# Patient Record
Sex: Female | Born: 1970 | Race: White | Hispanic: No | Smoking: Never smoker
Health system: Southern US, Community
[De-identification: ages and names within clinical notes are randomized; demographics above are authoritative.]

## PROBLEM LIST (undated history)

## (undated) ENCOUNTER — Ambulatory Visit

## (undated) DIAGNOSIS — F419 Anxiety disorder, unspecified: Secondary | ICD-10-CM

## (undated) DIAGNOSIS — T7840XA Allergy, unspecified, initial encounter: Secondary | ICD-10-CM

## (undated) DIAGNOSIS — R569 Unspecified convulsions: Secondary | ICD-10-CM

## (undated) DIAGNOSIS — Z9109 Other allergy status, other than to drugs and biological substances: Secondary | ICD-10-CM

## (undated) DIAGNOSIS — F329 Major depressive disorder, single episode, unspecified: Secondary | ICD-10-CM

## (undated) DIAGNOSIS — M1990 Unspecified osteoarthritis, unspecified site: Secondary | ICD-10-CM

## (undated) DIAGNOSIS — I341 Nonrheumatic mitral (valve) prolapse: Secondary | ICD-10-CM

## (undated) DIAGNOSIS — O24419 Gestational diabetes mellitus in pregnancy, unspecified control: Secondary | ICD-10-CM

## (undated) DIAGNOSIS — R011 Cardiac murmur, unspecified: Secondary | ICD-10-CM

## (undated) DIAGNOSIS — F32A Depression, unspecified: Secondary | ICD-10-CM

## (undated) DIAGNOSIS — Z8619 Personal history of other infectious and parasitic diseases: Secondary | ICD-10-CM

## (undated) DIAGNOSIS — G43909 Migraine, unspecified, not intractable, without status migrainosus: Secondary | ICD-10-CM

## (undated) HISTORY — PX: DILATION AND CURETTAGE OF UTERUS: SHX78

## (undated) HISTORY — DX: Unspecified convulsions: R56.9

## (undated) HISTORY — DX: Allergy, unspecified, initial encounter: T78.40XA

## (undated) HISTORY — DX: Other allergy status, other than to drugs and biological substances: Z91.09

## (undated) HISTORY — DX: Personal history of other infectious and parasitic diseases: Z86.19

## (undated) HISTORY — DX: Nonrheumatic mitral (valve) prolapse: I34.1

## (undated) HISTORY — DX: Gestational diabetes mellitus in pregnancy, unspecified control: O24.419

## (undated) HISTORY — DX: Cardiac murmur, unspecified: R01.1

## (undated) HISTORY — DX: Unspecified osteoarthritis, unspecified site: M19.90

## (undated) HISTORY — DX: Migraine, unspecified, not intractable, without status migrainosus: G43.909

## (undated) HISTORY — DX: Depression, unspecified: F32.A

## (undated) HISTORY — DX: Major depressive disorder, single episode, unspecified: F32.9

---

## 2001-05-10 HISTORY — PX: WISDOM TOOTH EXTRACTION: SHX21

## 2005-05-10 HISTORY — PX: OTHER SURGICAL HISTORY: SHX169

## 2006-05-10 HISTORY — PX: LEEP: SHX91

## 2011-02-08 HISTORY — PX: ABDOMINAL HYSTERECTOMY: SHX81

## 2012-02-18 ENCOUNTER — Ambulatory Visit: Payer: Self-pay | Admitting: Internal Medicine

## 2012-02-22 ENCOUNTER — Ambulatory Visit: Payer: Self-pay | Admitting: Internal Medicine

## 2012-05-26 ENCOUNTER — Other Ambulatory Visit (HOSPITAL_COMMUNITY)
Admission: RE | Admit: 2012-05-26 | Discharge: 2012-05-26 | Disposition: A | Discharge status: Home / Self Care | Payer: 59 | Source: Ambulatory Visit | Attending: Internal Medicine | Admitting: Internal Medicine

## 2012-05-26 ENCOUNTER — Ambulatory Visit (INDEPENDENT_AMBULATORY_CARE_PROVIDER_SITE_OTHER): Payer: 59 | Admitting: Internal Medicine

## 2012-05-26 ENCOUNTER — Encounter: Payer: Self-pay | Admitting: Internal Medicine

## 2012-05-26 VITALS — BP 110/70 | HR 87 | Temp 98.7°F | Ht 67.0 in | Wt 134.0 lb

## 2012-05-26 DIAGNOSIS — Z01419 Encounter for gynecological examination (general) (routine) without abnormal findings: Secondary | ICD-10-CM | POA: Insufficient documentation

## 2012-05-26 DIAGNOSIS — G40909 Epilepsy, unspecified, not intractable, without status epilepticus: Secondary | ICD-10-CM

## 2012-05-26 DIAGNOSIS — Z1151 Encounter for screening for human papillomavirus (HPV): Secondary | ICD-10-CM | POA: Insufficient documentation

## 2012-05-26 DIAGNOSIS — F329 Major depressive disorder, single episode, unspecified: Secondary | ICD-10-CM

## 2012-05-26 DIAGNOSIS — G43909 Migraine, unspecified, not intractable, without status migrainosus: Secondary | ICD-10-CM

## 2012-05-26 DIAGNOSIS — I341 Nonrheumatic mitral (valve) prolapse: Secondary | ICD-10-CM

## 2012-05-26 DIAGNOSIS — Z139 Encounter for screening, unspecified: Secondary | ICD-10-CM

## 2012-05-26 DIAGNOSIS — I059 Rheumatic mitral valve disease, unspecified: Secondary | ICD-10-CM

## 2012-05-28 ENCOUNTER — Encounter: Payer: Self-pay | Admitting: Internal Medicine

## 2012-05-28 DIAGNOSIS — I341 Nonrheumatic mitral (valve) prolapse: Secondary | ICD-10-CM | POA: Insufficient documentation

## 2012-05-28 DIAGNOSIS — F329 Major depressive disorder, single episode, unspecified: Secondary | ICD-10-CM | POA: Insufficient documentation

## 2012-05-28 DIAGNOSIS — G40909 Epilepsy, unspecified, not intractable, without status epilepticus: Secondary | ICD-10-CM | POA: Insufficient documentation

## 2012-05-28 DIAGNOSIS — G43909 Migraine, unspecified, not intractable, without status migrainosus: Secondary | ICD-10-CM | POA: Insufficient documentation
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
## 2012-05-28 NOTE — Assessment & Plan Note (Signed)
Better since her hysterectomy.  Not a frequent occurrence.  See above.  Hold on any additional treatment.  Follow.   

## 2012-05-28 NOTE — Assessment & Plan Note (Signed)
Doing well on Dilantin.  Sees neurology in [facility].  Has EEG every other year.  Stable.  Follow. They are following labs related to the Dilantin. Obtain records.

## 2012-05-28 NOTE — Assessment & Plan Note (Signed)
Murmur audible on exam.  Has been several years since last echo.  Apparently had mitral regurgitation with the MVP.  Obtain echo to further evaluate the mitral valve.  Currently asymptomatic.    

## 2012-05-30 ENCOUNTER — Encounter: Payer: Self-pay | Admitting: *Deleted

## 2012-06-27 ENCOUNTER — Other Ambulatory Visit: Payer: 59

## 2013-02-21 ENCOUNTER — Ambulatory Visit: Payer: Self-pay | Admitting: Internal Medicine

## 2013-03-09 ENCOUNTER — Encounter: Payer: Self-pay | Admitting: Internal Medicine

## 2013-06-01 ENCOUNTER — Other Ambulatory Visit (HOSPITAL_COMMUNITY)
Admission: RE | Admit: 2013-06-01 | Discharge: 2013-06-01 | Disposition: A | Discharge status: Home / Self Care | Payer: 59 | Source: Ambulatory Visit | Attending: Internal Medicine | Admitting: Internal Medicine

## 2013-06-01 ENCOUNTER — Ambulatory Visit (INDEPENDENT_AMBULATORY_CARE_PROVIDER_SITE_OTHER): Payer: 59 | Admitting: Internal Medicine

## 2013-06-01 ENCOUNTER — Encounter: Payer: Self-pay | Admitting: Internal Medicine

## 2013-06-01 VITALS — BP 110/70 | HR 95 | Temp 98.6°F | Ht 67.0 in | Wt 142.2 lb

## 2013-06-01 DIAGNOSIS — Z124 Encounter for screening for malignant neoplasm of cervix: Secondary | ICD-10-CM

## 2013-06-01 DIAGNOSIS — R5383 Other fatigue: Secondary | ICD-10-CM

## 2013-06-01 DIAGNOSIS — B9789 Other viral agents as the cause of diseases classified elsewhere: Secondary | ICD-10-CM

## 2013-06-01 DIAGNOSIS — F329 Major depressive disorder, single episode, unspecified: Secondary | ICD-10-CM

## 2013-06-01 DIAGNOSIS — F32A Depression, unspecified: Secondary | ICD-10-CM

## 2013-06-01 DIAGNOSIS — I059 Rheumatic mitral valve disease, unspecified: Secondary | ICD-10-CM

## 2013-06-01 DIAGNOSIS — F3289 Other specified depressive episodes: Secondary | ICD-10-CM

## 2013-06-01 DIAGNOSIS — Z1151 Encounter for screening for human papillomavirus (HPV): Secondary | ICD-10-CM | POA: Insufficient documentation

## 2013-06-01 DIAGNOSIS — B349 Viral infection, unspecified: Secondary | ICD-10-CM

## 2013-06-01 DIAGNOSIS — G40909 Epilepsy, unspecified, not intractable, without status epilepticus: Secondary | ICD-10-CM

## 2013-06-01 DIAGNOSIS — I341 Nonrheumatic mitral (valve) prolapse: Secondary | ICD-10-CM

## 2013-06-01 DIAGNOSIS — G43909 Migraine, unspecified, not intractable, without status migrainosus: Secondary | ICD-10-CM

## 2013-06-01 DIAGNOSIS — Z13220 Encounter for screening for lipoid disorders: Secondary | ICD-10-CM

## 2013-06-01 DIAGNOSIS — Z01419 Encounter for gynecological examination (general) (routine) without abnormal findings: Secondary | ICD-10-CM | POA: Insufficient documentation

## 2013-06-01 DIAGNOSIS — M5430 Sciatica, unspecified side: Secondary | ICD-10-CM

## 2013-06-01 DIAGNOSIS — R5381 Other malaise: Secondary | ICD-10-CM

## 2013-06-01 NOTE — Progress Notes (Signed)
Pre-visit discussion using our clinic review tool. No additional management support is needed unless otherwise documented below in the visit note.  
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
## 2013-06-01 NOTE — Assessment & Plan Note (Signed)
Murmur audible on exam.  Has been several years since last echo.  Apparently had mitral regurgitation with the MVP.  Obtain echo to further evaluate the mitral valve.  Currently asymptomatic.

## 2013-06-04 ENCOUNTER — Encounter: Payer: Self-pay | Admitting: Internal Medicine

## 2013-06-04 DIAGNOSIS — M5430 Sciatica, unspecified side: Secondary | ICD-10-CM | POA: Insufficient documentation

## 2013-06-04 DIAGNOSIS — B349 Viral infection, unspecified: Secondary | ICD-10-CM | POA: Insufficient documentation

## 2013-06-04 NOTE — Assessment & Plan Note (Signed)
Symptoms just started today.  Probably a viral syndrome.  Treat symptoms.  Tylenol.   Saline.  Follow.  Notify me if symptoms worsen.

## 2013-06-04 NOTE — Assessment & Plan Note (Signed)
Doing well on Dilantin.  Sees neurology in [facility].  Has EEG every other year.  Stable.  Follow. They are following labs related to the Dilantin.

## 2013-06-04 NOTE — Assessment & Plan Note (Signed)
Better since her hysterectomy.  Not a frequent occurrence.  See above.  Hold on any additional treatment.  Follow.
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
## 2013-06-04 NOTE — Assessment & Plan Note (Signed)
Is planning to get back in the gym.  Exercising.  Wants to see if improves with this.  Will let me know if persistent problems.

## 2013-06-06 ENCOUNTER — Encounter: Payer: Self-pay | Admitting: Internal Medicine

## 2013-06-13 ENCOUNTER — Other Ambulatory Visit (INDEPENDENT_AMBULATORY_CARE_PROVIDER_SITE_OTHER): Payer: 59

## 2013-06-13 DIAGNOSIS — R5383 Other fatigue: Secondary | ICD-10-CM

## 2013-06-13 DIAGNOSIS — R5381 Other malaise: Secondary | ICD-10-CM

## 2013-06-13 DIAGNOSIS — Z13220 Encounter for screening for lipoid disorders: Secondary | ICD-10-CM

## 2013-06-13 LAB — LIPID PANEL
CHOL/HDL RATIO: 3
Cholesterol: 189 mg/dL (ref 0–200)
HDL: 70.7 mg/dL (ref >39.00)
LDL CALC: 100 mg/dL — AB (ref 0–99)
Triglycerides: 94 mg/dL (ref 0.0–149.0)
VLDL: 18.8 mg/dL (ref 0.0–40.0)

## 2013-06-13 LAB — COMPREHENSIVE METABOLIC PANEL
ALBUMIN: 4 g/dL (ref 3.5–5.2)
ALK PHOS: 82 U/L (ref 39–117)
ALT: 16 U/L (ref 0–35)
AST: 17 U/L (ref 0–37)
BUN: 13 mg/dL (ref 6–23)
CO2: 26 mEq/L (ref 19–32)
Calcium: 9 mg/dL (ref 8.4–10.5)
Chloride: 106 mEq/L (ref 96–112)
Creatinine, Ser: 0.6 mg/dL (ref 0.4–1.2)
GFR: 109.04 mL/min (ref >60.00)
Glucose, Bld: 82 mg/dL (ref 70–99)
Potassium: 4.5 mEq/L (ref 3.5–5.1)
Sodium: 140 mEq/L (ref 135–145)
Total Bilirubin: 0.6 mg/dL (ref 0.3–1.2)
Total Protein: 7.2 g/dL (ref 6.0–8.3)

## 2013-06-13 LAB — CBC WITH DIFFERENTIAL/PLATELET
Basophils Absolute: 0 10*3/uL (ref 0.0–0.1)
Basophils Relative: 0.3 % (ref 0.0–3.0)
EOS ABS: 0.1 10*3/uL (ref 0.0–0.7)
Eosinophils Relative: 1.7 % (ref 0.0–5.0)
HCT: 38.3 % (ref 36.0–46.0)
Hemoglobin: 12.7 g/dL (ref 12.0–15.0)
Lymphocytes Relative: 17 % (ref 12.0–46.0)
Lymphs Abs: 1.3 10*3/uL (ref 0.7–4.0)
MCHC: 33.2 g/dL (ref 30.0–36.0)
MCV: 99 fl (ref 78.0–100.0)
MONO ABS: 0.5 10*3/uL (ref 0.1–1.0)
Monocytes Relative: 6.1 % (ref 3.0–12.0)
NEUTROS PCT: 74.9 % (ref 43.0–77.0)
Neutro Abs: 5.6 10*3/uL (ref 1.4–7.7)
Platelets: 301 10*3/uL (ref 150.0–400.0)
RBC: 3.86 Mil/uL — ABNORMAL LOW (ref 3.87–5.11)
RDW: 13.7 % (ref 11.5–14.6)
WBC: 7.5 10*3/uL (ref 4.5–10.5)

## 2013-06-13 LAB — TSH: TSH: 2.23 u[IU]/mL (ref 0.35–5.50)

## 2013-06-14 ENCOUNTER — Encounter: Payer: Self-pay | Admitting: Internal Medicine

## 2013-07-03 ENCOUNTER — Ambulatory Visit: Payer: Self-pay | Admitting: General Practice

## 2013-09-26 ENCOUNTER — Encounter: Payer: Self-pay | Admitting: Internal Medicine

## 2013-09-27 NOTE — Telephone Encounter (Signed)
Pt notified, agreeable to referral, would like to see Dr. Ronnald Ramp in [place]. Will also take 9:00 appointment tomorrow due to her increased pain. Appt scheduled

## 2013-09-27 NOTE — Telephone Encounter (Signed)
Noted  

## 2013-09-28 ENCOUNTER — Ambulatory Visit (INDEPENDENT_AMBULATORY_CARE_PROVIDER_SITE_OTHER): Payer: 59 | Admitting: Internal Medicine

## 2013-09-28 ENCOUNTER — Encounter: Payer: Self-pay | Admitting: Internal Medicine

## 2013-09-28 VITALS — BP 110/70 | HR 94 | Temp 98.4°F | Ht 67.0 in | Wt 148.0 lb

## 2013-09-28 DIAGNOSIS — M79604 Pain in right leg: Secondary | ICD-10-CM

## 2013-09-28 DIAGNOSIS — M549 Dorsalgia, unspecified: Secondary | ICD-10-CM

## 2013-09-28 DIAGNOSIS — R200 Anesthesia of skin: Secondary | ICD-10-CM

## 2013-09-28 DIAGNOSIS — R209 Unspecified disturbances of skin sensation: Secondary | ICD-10-CM

## 2013-09-28 DIAGNOSIS — M79609 Pain in unspecified limb: Secondary | ICD-10-CM

## 2013-09-28 MED ORDER — HYDROCODONE-ACETAMINOPHEN 5-325 MG PO TABS: ORAL_TABLET | ORAL | Status: DC

## 2013-09-28 NOTE — Progress Notes (Signed)
Pre visit review using our clinic review tool, if applicable. No additional management support is needed unless otherwise documented below in the visit note. 

## 2013-10-01 ENCOUNTER — Encounter: Payer: Self-pay | Admitting: Internal Medicine

## 2013-10-01 ENCOUNTER — Emergency Department: Payer: Self-pay | Admitting: Emergency Medicine

## 2013-10-01 DIAGNOSIS — M79604 Pain in right leg: Secondary | ICD-10-CM | POA: Insufficient documentation

## 2013-10-01 NOTE — Assessment & Plan Note (Signed)
Back and right leg pain as outlined.  Tingling as outlined.  Treat with medrol dose pack - 6 day taper.  Also give her hydrocodone 5/325 as directed.  Will check MRI L- S Spine given radicular symptoms and previous MRI with spinal cyst (2012).  Compare.  She desires referral to Dr Sherley Bounds.

## 2013-10-01 NOTE — Progress Notes (Signed)
  Subjective:    Patient ID: Alyssa Bautista, female    DOB: [birth year], 43 y.o.   MRN: [id number]  HPI 43 year old female with past history of seizure disorder (dx 1996), migraine headaches, depression and abnormal pap smear requiring hysterectomy.  She comes in today as a work in with concerns regarding increased back pain and right leg pain.   Has had problems with sciatica (right).  Intermittent flares over the last two years.  Over the last two months has had more frequent flares.  Recently increased pain - not going away.  Increased back pain and pain extending in to the hip and right leg.  Some tingling in her feet.  Has been taking tylenol and aspirin.     Past Medical History  Diagnosis Date  . Arthritis   . Depression   . Gestational diabetes   . Environmental allergies   . Migraines   . History of chicken pox   . Seizures   . MVP (mitral valve prolapse)     Current Outpatient Prescriptions on File Prior to Visit  Medication Sig Dispense Refill  . aspirin 325 MG EC tablet Take 325 mg by mouth every 6 (six) hours as needed for pain.      . cetirizine (ZYRTEC) 10 MG tablet Take 10 mg by mouth daily as needed for allergies.      Marland Kitchen escitalopram (LEXAPRO) 10 MG tablet Take 10 mg by mouth daily.      . phenytoin (DILANTIN) 100 MG ER capsule Take by mouth 3 (three) times daily.       No current facility-administered medications on file prior to visit.    Review of Systems Patient denies any headache, lightheadedness or dizziness.  No nausea or vomiting.  No acid reflux.  No abdominal pain or cramping.  No bowel change.  No urine change.  Seizures as outlined.  No vaginal problems.  Some intermittent sciatica flares.  Most recent continues.  Increased pain in her lower back and extending down her right leg.  Tingling as outlined.          Objective:   Physical Exam  Filed Vitals:   09/28/13 0901  BP: 110/70  Pulse: 94  Temp: 98.4 F (25.79 C)   43 year old female in no  acute distress.  MSK:  Increased pain with straight leg raise (both legs).  Increased pulling sensation with leaning forward.  No motor weakness.  Plantar and dorsi flexion - intact.   EXTREMITIES:  No increased edema present.  DP pulses palpable and equal bilaterally.          Assessment & Plan:  GYN.  S/p vaginal hysterectomy for abnormal pap.  Pap 05/26/12 negative with negative HPV.  Ovaries not removed.    HEALTH MAINTENANCE.  Physical last visit.  Pap 05/26/12 negative with negative HPV.    Mammogram 02/21/13 - Birads I. No family history of colon cancer or colon polyps.

## 2013-10-02 ENCOUNTER — Telehealth: Payer: Self-pay | Admitting: Internal Medicine

## 2013-10-02 NOTE — Telephone Encounter (Signed)
ER records requested.

## 2013-10-02 NOTE — Telephone Encounter (Signed)
States she was seen last week, has been taking the prednisone.  States she was feeling better and being mindful of her movements.  Kneeled yesterday, back went out; was on all fours and could not move.  Had ER visit yesterday and was told to f/u with PCP.  States she is waiting to hear back about referrals from Dr. Nicki Reaper.  Asking if she should just wait on the referrals or if Dr. Nicki Reaper needs to see her for ER f/u.

## 2013-10-03 NOTE — Telephone Encounter (Signed)
This is the pt we discussed 10/02/13.  You had informed me that you spoke to her and she had pain medication from ER.  MRI has been ordered and the referral order (for appt with Dr Ronnald Ramp) has been placed.  I spoke to [facility] and someone should be calling her with the MRI appt - 10/03/13.  Let me know if any problems or if she feels she needs to be seen.

## 2013-10-03 NOTE — Telephone Encounter (Signed)
Pt.notified

## 2013-10-04 ENCOUNTER — Ambulatory Visit: Payer: Self-pay | Admitting: Internal Medicine

## 2013-10-05 ENCOUNTER — Telehealth: Payer: Self-pay | Admitting: Internal Medicine

## 2013-10-05 ENCOUNTER — Ambulatory Visit (HOSPITAL_COMMUNITY): Admission: RE | Admit: 2013-10-05 | Payer: 59 | Source: Ambulatory Visit

## 2013-10-05 ENCOUNTER — Encounter: Payer: Self-pay | Admitting: *Deleted

## 2013-10-05 NOTE — Telephone Encounter (Signed)
Noted  

## 2013-10-05 NOTE — Telephone Encounter (Signed)
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  (also possibly some bleeding around this area).  Let her know that we had already put in for neurosurgery referral.  I have spoke with the coordinator and informed her to let [facility] know of MRI results and hopefully speed up the process.  Let me know if any problems.    Dr Nicki Reaper

## 2013-10-05 NOTE — Telephone Encounter (Signed)
Unable to reach pt by phone (sent mychart message)-after message send I was notified that [facility] also tried to reach patient & offer her an appt today @ 1:15. Was then notified about an hour later that patient was reached & stated that she could not go today. Pt was then scheduled for June 8th @ 3:30.

## 2014-03-01 ENCOUNTER — Encounter: Payer: Self-pay | Admitting: Internal Medicine

## 2014-03-01 NOTE — Telephone Encounter (Signed)
Called pt and left message, also sent mychart.

## 2014-03-04 ENCOUNTER — Telehealth: Payer: Self-pay | Admitting: Internal Medicine

## 2014-03-04 NOTE — Telephone Encounter (Signed)
Pt coming in on Wed. @ 8am.

## 2014-03-04 NOTE — Telephone Encounter (Signed)
This patient has to work tomorrow morning and she is wanting to reschedule to tomorrow afternoon or another day in the afternoon. Please advise.

## 2014-03-05 ENCOUNTER — Ambulatory Visit: Payer: 59 | Admitting: Internal Medicine

## 2014-03-06 ENCOUNTER — Encounter: Payer: Self-pay | Admitting: Internal Medicine

## 2014-03-06 ENCOUNTER — Ambulatory Visit (INDEPENDENT_AMBULATORY_CARE_PROVIDER_SITE_OTHER): Payer: 59 | Admitting: Internal Medicine

## 2014-03-06 VITALS — BP 110/70 | HR 86 | Temp 98.8°F | Ht 67.0 in | Wt 152.2 lb

## 2014-03-06 DIAGNOSIS — Z658 Other specified problems related to psychosocial circumstances: Secondary | ICD-10-CM

## 2014-03-06 DIAGNOSIS — F329 Major depressive disorder, single episode, unspecified: Secondary | ICD-10-CM

## 2014-03-06 DIAGNOSIS — F32A Depression, unspecified: Secondary | ICD-10-CM

## 2014-03-06 DIAGNOSIS — F439 Reaction to severe stress, unspecified: Secondary | ICD-10-CM

## 2014-03-06 DIAGNOSIS — M2550 Pain in unspecified joint: Secondary | ICD-10-CM

## 2014-03-06 LAB — CBC WITH DIFFERENTIAL/PLATELET
BASOS PCT: 0.4 % (ref 0.0–3.0)
Basophils Absolute: 0 10*3/uL (ref 0.0–0.1)
Eosinophils Absolute: 0.2 10*3/uL (ref 0.0–0.7)
Eosinophils Relative: 3.7 % (ref 0.0–5.0)
HCT: 40 % (ref 36.0–46.0)
HEMOGLOBIN: 13.5 g/dL (ref 12.0–15.0)
LYMPHS PCT: 21.3 % (ref 12.0–46.0)
Lymphs Abs: 1.2 10*3/uL (ref 0.7–4.0)
MCHC: 33.7 g/dL (ref 30.0–36.0)
MCV: 97.3 fl (ref 78.0–100.0)
Monocytes Absolute: 0.4 10*3/uL (ref 0.1–1.0)
Monocytes Relative: 6.9 % (ref 3.0–12.0)
NEUTROS PCT: 67.7 % (ref 43.0–77.0)
Neutro Abs: 3.9 10*3/uL (ref 1.4–7.7)
Platelets: 290 10*3/uL (ref 150.0–400.0)
RBC: 4.11 Mil/uL (ref 3.87–5.11)
RDW: 13.4 % (ref 11.5–15.5)
WBC: 5.8 10*3/uL (ref 4.0–10.5)

## 2014-03-06 LAB — C-REACTIVE PROTEIN

## 2014-03-06 LAB — SEDIMENTATION RATE: Sed Rate: 12 mm/hr (ref 0–22)

## 2014-03-06 LAB — RHEUMATOID FACTOR

## 2014-03-06 MED ORDER — LORAZEPAM 0.5 MG PO TABS: ORAL_TABLET | ORAL | Status: DC

## 2014-03-06 MED ORDER — ESCITALOPRAM OXALATE 10 MG PO TABS: ORAL_TABLET | ORAL | Status: DC

## 2014-03-06 NOTE — Progress Notes (Signed)
Pre visit review using our clinic review tool, if applicable. No additional management support is needed unless otherwise documented below in the visit note. 

## 2014-03-06 NOTE — Progress Notes (Signed)
Subjective:    Patient ID: Alyssa Bautista, female    DOB: 14-Feb-1971, 43 y.o.   MRN: [id number]  Anxiety    43 year old female with past history of seizure disorder (dx 1996), migraine headaches, depression and abnormal pap smear requiring hysterectomy.  She comes in today as a work in with concerns regarding increased anxiety.  She reports increased stress recently.  Her brother-n-law committed suicide.  Within the next 1-2 weeks, her grandmother passed away.  Increased stress related to this and other issues.  Discussed at length with her today.  Discussed treatment options.  She has checked in to counseling offered through her work.  Plans to follow through with this.  We discussed increasing lexapro and using something short term that is more immediate release.  She denies any suicidal thoughts.  Eating and drinking.     Past Medical History  Diagnosis Date  . Arthritis   . Depression   . Gestational diabetes   . Environmental allergies   . Migraines   . History of chicken pox   . Seizures   . MVP (mitral valve prolapse)     Current Outpatient Prescriptions on File Prior to Visit  Medication Sig Dispense Refill  . aspirin 325 MG EC tablet Take 325 mg by mouth every 6 (six) hours as needed for pain.      . cetirizine (ZYRTEC) 10 MG tablet Take 10 mg by mouth daily as needed for allergies.      Marland Kitchen escitalopram (LEXAPRO) 10 MG tablet Take 10 mg by mouth daily.      . phenytoin (DILANTIN) 100 MG ER capsule Take by mouth 3 (three) times daily.       No current facility-administered medications on file prior to visit.    Review of Systems Patient denies any headache, lightheadedness or dizziness.  No nausea or vomiting.  No acid reflux.  No abdominal pain or cramping.  No bowel change.  Eating and drinking.  Increased stress as outlined.  No suicidal thoughts. Reports increased joint pain and stiffness. Worsening.       Objective:   Physical Exam  Filed Vitals:   03/06/14 0804  BP: 110/70  Pulse: 86  Temp: 98.8 F (91.37 C)   43 year old female in no acute distress.  NECK:  Supple.  Nontender.  No audible bruit.  HEART:  Appears to be regular. LUNGS:  No crackles or wheezing audible.  Respirations even and unlabored.  RADIAL PULSE:  Equal bilaterally.  ABDOMEN:  Soft, nontender.  Bowel sounds present and normal.  No audible abdominal bruit.   EXTREMITIES:  No increased edema present.  DP pulses palpable and equal bilaterally.          Assessment & Plan:  GYN.  S/p vaginal hysterectomy for abnormal pap.  Pap 05/26/12 negative with negative HPV.  Ovaries not removed.    Joint pain Described increased joint pain and stiffness.   - CBC with Differential - Sedimentation rate - C-reactive protein - Rheumatoid factor - ANA  Stress Increased stress as outlined.  Discussed in detail with her today.  Will increase lexapro to 10mg   1 1/2 tablet q day.  Ativan .5mg  1/2 tablet daily as needed.  She plans to go through counseling at her work.    Depression Increase lexapro as outlined.  See above.  No suicidal ideations.   HEALTH MAINTENANCE.  Physical 06/01/13. Pap 05/26/12 negative with negative HPV.    Mammogram 02/21/13 - Birads I.  Needs f/u mammogram.   No family history of colon cancer or colon polyps.     I spent 25 minutes with the patient and more than 50% of the time was spent in consultation regarding the above.

## 2014-03-07 ENCOUNTER — Encounter: Payer: Self-pay | Admitting: Internal Medicine

## 2014-03-07 DIAGNOSIS — M2560 Stiffness of unspecified joint, not elsewhere classified: Secondary | ICD-10-CM

## 2014-03-07 LAB — ANA: Anti Nuclear Antibody(ANA): NEGATIVE

## 2014-03-07 NOTE — Telephone Encounter (Signed)
Order placed for rheumatology referral.  

## 2014-03-11 ENCOUNTER — Encounter: Payer: Self-pay | Admitting: Internal Medicine

## 2014-03-11 DIAGNOSIS — F439 Reaction to severe stress, unspecified: Secondary | ICD-10-CM | POA: Insufficient documentation

## 2014-03-28 ENCOUNTER — Encounter: Payer: Self-pay | Admitting: *Deleted

## 2014-03-28 ENCOUNTER — Ambulatory Visit: Payer: Self-pay | Admitting: Internal Medicine

## 2014-03-28 LAB — HM MAMMOGRAPHY: HM MAMMO: NEGATIVE

## 2014-04-09 ENCOUNTER — Encounter: Payer: Self-pay | Admitting: Internal Medicine

## 2014-04-24 ENCOUNTER — Encounter: Payer: Self-pay | Admitting: Internal Medicine

## 2014-04-24 ENCOUNTER — Ambulatory Visit (INDEPENDENT_AMBULATORY_CARE_PROVIDER_SITE_OTHER): Payer: 59 | Admitting: Internal Medicine

## 2014-04-24 VITALS — BP 110/66 | HR 100 | Temp 99.0°F | Resp 14 | Ht 67.0 in | Wt 149.5 lb

## 2014-04-24 DIAGNOSIS — F439 Reaction to severe stress, unspecified: Secondary | ICD-10-CM

## 2014-04-24 DIAGNOSIS — Z658 Other specified problems related to psychosocial circumstances: Secondary | ICD-10-CM

## 2014-04-24 DIAGNOSIS — R011 Cardiac murmur, unspecified: Secondary | ICD-10-CM

## 2014-04-24 DIAGNOSIS — F32A Depression, unspecified: Secondary | ICD-10-CM

## 2014-04-24 DIAGNOSIS — G40909 Epilepsy, unspecified, not intractable, without status epilepticus: Secondary | ICD-10-CM

## 2014-04-24 DIAGNOSIS — F329 Major depressive disorder, single episode, unspecified: Secondary | ICD-10-CM

## 2014-04-24 MED ORDER — LORAZEPAM 0.5 MG PO TABS: ORAL_TABLET | ORAL | Status: DC

## 2014-04-24 NOTE — Progress Notes (Signed)
Pre visit review using our clinic review tool, if applicable. No additional management support is needed unless otherwise documented below in the visit note. 

## 2014-04-28 ENCOUNTER — Encounter: Payer: Self-pay | Admitting: Internal Medicine

## 2014-04-28 DIAGNOSIS — R011 Cardiac murmur, unspecified: Secondary | ICD-10-CM | POA: Insufficient documentation

## 2014-04-28 NOTE — Progress Notes (Signed)
  Subjective:    Patient ID: Alyssa Bautista, female    DOB: 25-Mar-1971, 43 y.o.   MRN: [id number]  HPI 43 year old female with past history of seizure disorder (dx 1996), migraine headaches, depression and abnormal pap smear requiring hysterectomy.  She comes in today for a scheduled follow up.  Having problems recently with increased anxiety.  See last note for details.  Was started on lexapro.  Feels this is helping.  Doing better.  Uses lorazepam prn.  (uses rarely).  Coping better.  Eating and drinking well.  No nausea or vomiting.  No bowel change.       Past Medical History  Diagnosis Date  . Arthritis   . Depression   . Gestational diabetes   . Environmental allergies   . Migraines   . History of chicken pox   . Seizures   . MVP (mitral valve prolapse)     Current Outpatient Prescriptions on File Prior to Visit  Medication Sig Dispense Refill  . aspirin 325 MG EC tablet Take 325 mg by mouth every 6 (six) hours as needed for pain.    . cetirizine (ZYRTEC) 10 MG tablet Take 10 mg by mouth daily as needed for allergies.    Marland Kitchen escitalopram (LEXAPRO) 10 MG tablet Take 1 1/2 tablets q day 45 tablet 2  . phenytoin (DILANTIN) 100 MG ER capsule Take by mouth 3 (three) times daily.     No current facility-administered medications on file prior to visit.    Review of Systems Patient denies any headache, lightheadedness or dizziness.  No nausea or vomiting.  No acid reflux.  No abdominal pain or cramping.  No bowel change.  No urine change.  No seizures reported. Doing better on lexapro.  Feels better.        Objective:   Physical Exam  Filed Vitals:   04/24/14 1223  BP: 110/66  Pulse: 100  Temp: 99 F (37.2 C)  Resp: 5   43 year old female in no acute distress.   HEENT:  Nares- clear.  Oropharynx - without lesions. NECK:  Supple.  Nontender.  No audible bruit.  HEART:  Appears to be regular.  2/6 systolic murmur.   LUNGS:  No crackles or wheezing audible.   Respirations even and unlabored.  RADIAL PULSE:  Equal bilaterally.  ABDOMEN:  Soft, nontender.  Bowel sounds present and normal.  No audible abdominal bruit.  EXTREMITIES:  No increased edema present.  DP pulses palpable and equal bilaterally.          Assessment & Plan:  1. Stress Overall doing better.  On lexapro.  Follow.    2. Depression Doing better.  On lexapro.  Follow.  Seeing a Social worker.    3. Seizure disorder Stable. On dilantin.   4. Murmur Murmur as outlined.  Discussed echo.  She prefers referral to cardiology for evaluation. Will arrange.    5. GYN.  S/p vaginal hysterectomy for abnormal pap.  Pap 05/26/12 negative with negative HPV.  Ovaries not removed.    HEALTH MAINTENANCE.  Physical 06/01/13.  Pap 05/26/12 negative with negative HPV.    Mammogram 03/28/14 - Birads I. No family history of colon cancer or colon polyps.

## 2014-04-30 ENCOUNTER — Encounter: Payer: Self-pay | Admitting: Internal Medicine

## 2014-05-01 ENCOUNTER — Encounter: Payer: Self-pay | Admitting: Internal Medicine

## 2014-05-17 ENCOUNTER — Other Ambulatory Visit: Payer: Self-pay

## 2014-05-17 ENCOUNTER — Encounter: Payer: Self-pay | Admitting: Cardiovascular Disease

## 2014-05-17 ENCOUNTER — Other Ambulatory Visit (INDEPENDENT_AMBULATORY_CARE_PROVIDER_SITE_OTHER): Payer: 59

## 2014-05-17 ENCOUNTER — Ambulatory Visit (INDEPENDENT_AMBULATORY_CARE_PROVIDER_SITE_OTHER): Payer: 59 | Admitting: Cardiovascular Disease

## 2014-05-17 VITALS — BP 106/70 | HR 76 | Ht 66.0 in | Wt 148.5 lb

## 2014-05-17 DIAGNOSIS — R011 Cardiac murmur, unspecified: Secondary | ICD-10-CM

## 2014-05-17 DIAGNOSIS — R9431 Abnormal electrocardiogram [ECG] [EKG]: Secondary | ICD-10-CM

## 2014-05-17 DIAGNOSIS — I341 Nonrheumatic mitral (valve) prolapse: Secondary | ICD-10-CM

## 2014-05-17 NOTE — Patient Instructions (Signed)
Your physician has requested that you have an echocardiogram. Echocardiography is a painless test that uses sound waves to create images of your heart. It provides your doctor with information about the size and shape of your heart and how well your heart's chambers and valves are working. This procedure takes approximately one hour. There are no restrictions for this procedure.   Your physician wants you to follow-up in: 1 year with Dr. Fletcher Anon. You will receive a reminder letter in the mail two months in advance. If you don't receive a letter, please call our office to schedule the follow-up appointment.

## 2014-05-17 NOTE — Assessment & Plan Note (Signed)
The patient has a cardiac murmur consistent with mitral valve prolapse with possible regurgitation. She has not had any recent cardiac evaluation. I requested an echocardiogram. Antibiotic prophylaxis is no longer recommended with this condition. She is currently asymptomatic.

## 2014-05-17 NOTE — Progress Notes (Signed)
Primary care physician: Dr. Nicki Reaper  HPI  44 year old female who was referred for evaluation of a cardiac murmur. She has known history of seizure disorder (dx 1996), migraine headaches, depression and abnormal pap smear requiring hysterectomy. She was told about the heart murmur at age 23 due to mitral valve prolapse. She has not had an echocardiogram in more than 10 years. She denies any chest pain, shortness of breath or palpitations. She is not a smoker. Her grandmother had mitral valve prolapse. There is no family history of coronary artery disease.  Allergies  Allergen Reactions  . Penicillins Rash  . Azithromycin Other (See Comments)    GI upset  . Dilantin [Phenytoin] Other (See Comments)    By IV, Vasovagil reaction  . Clindamycin/Lincomycin Rash     Current Outpatient Prescriptions on File Prior to Visit  Medication Sig Dispense Refill  . aspirin 325 MG EC tablet Take 325 mg by mouth every 6 (six) hours as needed for pain.    . cetirizine (ZYRTEC) 10 MG tablet Take 10 mg by mouth daily as needed for allergies.    Marland Kitchen escitalopram (LEXAPRO) 10 MG tablet Take 1 1/2 tablets q day 45 tablet 2  . LORazepam (ATIVAN) 0.5 MG tablet 1/2 tablet q day prn 20 tablet 0  . phenytoin (DILANTIN) 100 MG ER capsule Take by mouth 3 (three) times daily.     No current facility-administered medications on file prior to visit.     Past Medical History  Diagnosis Date  . Arthritis   . Depression   . Gestational diabetes   . Environmental allergies   . Migraines   . History of chicken pox   . Seizures   . MVP (mitral valve prolapse)      Past Surgical History  Procedure Laterality Date  . Abdominal hysterectomy  02/2011    abnormal pap (stage III dysplasia).   ovaries not removed.  [facility]  2008  . Dilation and curettage of uterus  2002, 2005  . Wisdom tooth extraction  2003  . Abscess removal  2007    cervix      Family History  Problem Relation Age of Onset  .  Hypercholesterolemia Mother   . Hypercholesterolemia Father   . Diabetes Paternal Grandfather   . Prostate cancer Paternal Grandfather   . Breast cancer Maternal Aunt   . Colon cancer Neg Hx      History   Social History  . Marital Status: Single    Spouse Name: N/A    Number of Children: 2  . Years of Education: N/A   Occupational History  . Not on file.   Social History Main Topics  . Smoking status: Never Smoker   . Smokeless tobacco: Never Used  . Alcohol Use: 0.0 oz/week    0 Not specified per week     Comment: rarely  . Drug Use: No  . Sexual Activity: Not on file   Other Topics Concern  . Not on file   Social History Narrative     ROS A 10 point review of system was performed. It is negative other than that mentioned in the history of present illness.   PHYSICAL EXAM   BP 106/70 mmHg  Pulse 76  Ht 5\' 6"  (1.676 m)  Wt 148 lb 8 oz (67.359 kg)  BMI 23.98 kg/m2  LMP 02/24/2011 Constitutional: She is oriented to person, place, and time. She appears well-developed and well-nourished. No distress.  HENT: No nasal  discharge.  Head: Normocephalic and atraumatic.  Eyes: Pupils are equal and round. No discharge.  Neck: Normal range of motion. Neck supple. No JVD present. No thyromegaly present.  Cardiovascular: Normal rate, regular rhythm, normal heart sounds. Exam reveals no gallop and no friction rub. There is a 2 out of 6 late systolic murmur at the left sternal border  Pulmonary/Chest: Effort normal and breath sounds normal. No stridor. No respiratory distress. She has no wheezes. She has no rales. She exhibits no tenderness.  Abdominal: Soft. Bowel sounds are normal. She exhibits no distension. There is no tenderness. There is no rebound and no guarding.  Musculoskeletal: Normal range of motion. She exhibits no edema and no tenderness.  Neurological: She is alert and oriented to person, place, and time. Coordination normal.  Skin: Skin is warm and dry. No  rash noted. She is not diaphoretic. No erythema. No pallor.  Psychiatric: She has a normal mood and affect. Her behavior is normal. Judgment and thought content normal.     [id number]  Rhythm  -  Nonspecific T-abnormality.   ABNORMAL    ASSESSMENT AND PLAN

## 2014-05-24 ENCOUNTER — Encounter: Payer: Self-pay | Admitting: Internal Medicine

## 2014-06-07 ENCOUNTER — Ambulatory Visit (INDEPENDENT_AMBULATORY_CARE_PROVIDER_SITE_OTHER): Payer: 59 | Admitting: Internal Medicine

## 2014-06-07 ENCOUNTER — Encounter: Payer: Self-pay | Admitting: Internal Medicine

## 2014-06-07 ENCOUNTER — Other Ambulatory Visit (HOSPITAL_COMMUNITY)
Admission: RE | Admit: 2014-06-07 | Discharge: 2014-06-07 | Disposition: A | Discharge status: Home / Self Care | Payer: 59 | Source: Ambulatory Visit | Attending: Internal Medicine | Admitting: Internal Medicine

## 2014-06-07 VITALS — BP 108/70 | HR 82 | Temp 98.5°F | Ht 65.9 in | Wt 149.8 lb

## 2014-06-07 DIAGNOSIS — F32A Depression, unspecified: Secondary | ICD-10-CM

## 2014-06-07 DIAGNOSIS — Z0000 Encounter for general adult medical examination without abnormal findings: Secondary | ICD-10-CM

## 2014-06-07 DIAGNOSIS — F329 Major depressive disorder, single episode, unspecified: Secondary | ICD-10-CM

## 2014-06-07 DIAGNOSIS — Z01419 Encounter for gynecological examination (general) (routine) without abnormal findings: Secondary | ICD-10-CM | POA: Insufficient documentation

## 2014-06-07 DIAGNOSIS — G40909 Epilepsy, unspecified, not intractable, without status epilepticus: Secondary | ICD-10-CM

## 2014-06-07 DIAGNOSIS — Z13220 Encounter for screening for lipoid disorders: Secondary | ICD-10-CM

## 2014-06-07 DIAGNOSIS — Z1151 Encounter for screening for human papillomavirus (HPV): Secondary | ICD-10-CM | POA: Insufficient documentation

## 2014-06-07 DIAGNOSIS — F439 Reaction to severe stress, unspecified: Secondary | ICD-10-CM

## 2014-06-07 DIAGNOSIS — Z658 Other specified problems related to psychosocial circumstances: Secondary | ICD-10-CM

## 2014-06-07 DIAGNOSIS — I341 Nonrheumatic mitral (valve) prolapse: Secondary | ICD-10-CM

## 2014-06-07 DIAGNOSIS — E559 Vitamin D deficiency, unspecified: Secondary | ICD-10-CM | POA: Insufficient documentation

## 2014-06-07 DIAGNOSIS — M79604 Pain in right leg: Secondary | ICD-10-CM

## 2014-06-07 MED ORDER — LORAZEPAM 0.5 MG PO TABS: ORAL_TABLET | ORAL | Status: DC

## 2014-06-07 MED ORDER — ESCITALOPRAM OXALATE 10 MG PO TABS: ORAL_TABLET | ORAL | Status: DC

## 2014-06-07 NOTE — Progress Notes (Signed)
Patient ID: Alyssa Bautista, female   DOB: January 01, 1971, 44 y.o.   MRN: [id number]   Subjective:    Patient ID: Alyssa Bautista, female    DOB: 12/12/1970, 44 y.o.   MRN: [id number]  HPI  Patient here for her physical exam.  Increased stress.  Mother had stroke two weeks ago.  Feels she is handling things relatively well.  On lexapro..  Feels helping.  Occasionally takes lorazepam.  Recently saw Dr Jefm Bryant.  See his note.  On vitamin D supplementation now.  Discussed exercise and leg stretches to help with pain.     Past Medical History  Diagnosis Date  . Arthritis   . Depression   . Gestational diabetes   . Environmental allergies   . Migraines   . History of chicken pox   . Seizures   . MVP (mitral valve prolapse)     Current Outpatient Prescriptions on File Prior to Visit  Medication Sig Dispense Refill  . acetaminophen (TYLENOL) 500 MG tablet Take 500 mg by mouth as needed.    Marland Kitchen aspirin 325 MG EC tablet Take 325 mg by mouth every 6 (six) hours as needed for pain.    . cetirizine (ZYRTEC) 10 MG tablet Take 10 mg by mouth daily as needed for allergies.    . phenytoin (DILANTIN) 100 MG ER capsule Take by mouth 3 (three) times daily.     No current facility-administered medications on file prior to visit.    Review of Systems  Constitutional: Positive for fatigue. Negative for appetite change.  HENT: Negative for congestion and sinus pressure.   Respiratory: Negative for cough, chest tightness and shortness of breath.   Cardiovascular: Negative for chest pain, palpitations and leg swelling.  Gastrointestinal: Negative for nausea, vomiting, abdominal pain, diarrhea and constipation.  Musculoskeletal: Negative for back pain (does have leg pain) and neck pain.  Skin: Negative for color change and rash.  Neurological: Negative for dizziness, light-headedness and headaches.  Hematological: Negative for adenopathy. Does not bruise/bleed easily.  Psychiatric/Behavioral:  Negative for dysphoric mood. The patient is nervous/anxious (still some increased stress.  feels she is doing better).        Objective:    Physical Exam  Constitutional: She is oriented to person, place, and time. She appears well-developed and well-nourished.  HENT:  Nose: Nose normal.  Mouth/Throat: Oropharynx is clear and moist.  Eyes: Right eye exhibits no discharge. Left eye exhibits no discharge. No scleral icterus.  Neck: Neck supple. No thyromegaly present.  Cardiovascular: Normal rate and regular rhythm.   Pulmonary/Chest: Breath sounds normal. No accessory muscle usage. No tachypnea. No respiratory distress. She has no decreased breath sounds. She has no wheezes. She has no rhonchi. Right breast exhibits no inverted nipple, no mass, no nipple discharge and no tenderness (no axillary adenopathy). Left breast exhibits no inverted nipple, no mass, no nipple discharge and no tenderness (no axilarry adenopathy).  Abdominal: Soft. Bowel sounds are normal. There is no tenderness.  Genitourinary:  Normal external genitalia.  Vaginal vault without lesions.  Cervix identified.  Pap smear performed.  Could not appreciate any adnexal masses or tenderness.    Musculoskeletal: She exhibits no edema or tenderness.  Lymphadenopathy:    She has no cervical adenopathy.  Neurological: She is alert and oriented to person, place, and time.  Skin: Skin is warm. No rash noted.  Psychiatric: She has a normal mood and affect. Her behavior is normal.    BP 108/70 mmHg  Pulse  82  Temp(Src) 98.5 F (36.9 C) (Oral)  Ht 5' 5.9" (1.674 m)  Wt 149 lb 12 oz (67.926 kg)  BMI 24.24 kg/m2  SpO2 97%  LMP 02/24/2011 Wt Readings from Last 3 Encounters:  06/07/14 149 lb 12 oz (67.926 kg)  05/17/14 148 lb 8 oz (67.359 kg)  04/24/14 149 lb 8 oz (67.813 kg)     Lab Results  Component Value Date   WBC 5.8 03/06/2014   HGB 13.5 03/06/2014   HCT 40.0 03/06/2014   PLT 290.0 03/06/2014   GLUCOSE 82  06/13/2013   CHOL 189 06/13/2013   TRIG 94.0 06/13/2013   HDL 70.70 06/13/2013   LDLCALC 100* 06/13/2013   ALT 16 06/13/2013   AST 17 06/13/2013   NA 140 06/13/2013   K 4.5 06/13/2013   CL 106 06/13/2013   CREATININE 0.6 06/13/2013   BUN 13 06/13/2013   CO2 26 06/13/2013   TSH 2.23 06/13/2013       Assessment & Plan:   Problem List Items Addressed This Visit    Depression    On lexapro.  Seeing a Social worker.  Follow.        Relevant Medications   LORazepam (ATIVAN) tablet   escitalopram (LEXAPRO) tablet   Other Relevant Orders   Vitamin B12   TSH   Cytology - PAP   Health care maintenance    Mammogram 03/28/14 - Birads I.  PAP 06/07/14.        MVP (mitral valve prolapse) - Primary    Recent ECHO with MVP with mitral regurgitation.  Saw Dr Fletcher Anon.  Recommended yearly f/u ECHOs.  Currently doing well.        Relevant Orders   Cytology - PAP   Right leg pain    Back and right leg pain as outlined.  Still flares intermittently.  Stretches.  Follow.        Relevant Orders   Cytology - PAP   Seizure disorder    Doing well on dilantin.  Sees neurology.  Has EEG every other year.  Stable.        Relevant Orders   Dilantin (Phenytoin) level, total   Comprehensive metabolic panel   Cytology - PAP   Stress    On lexapro.  Increased stress.  Feels she is doing relatively well.  Seeing a Social worker.  Follow.        Relevant Orders   Cytology - PAP   Vitamin D deficiency    On ergocalciferol.  Follow.  Seeing Dr Jefm Bryant.        Relevant Orders   Cytology - PAP    Other Visit Diagnoses    Screening cholesterol level        Relevant Orders    Lipid panel    Cytology - PAP      I spent 25 minutes with the patient and more than 50% of the time was spent in consultation regarding the above.     Einar Pheasant, MD

## 2014-06-07 NOTE — Progress Notes (Signed)
Pre visit review using our clinic review tool, if applicable. No additional management support is needed unless otherwise documented below in the visit note. 

## 2014-06-09 ENCOUNTER — Encounter: Payer: Self-pay | Admitting: Internal Medicine

## 2014-06-09 DIAGNOSIS — Z0000 Encounter for general adult medical examination without abnormal findings: Secondary | ICD-10-CM | POA: Insufficient documentation

## 2014-06-09 NOTE — Assessment & Plan Note (Signed)
Doing well on dilantin.  Sees neurology.  Has EEG every other year.  Stable.

## 2014-06-09 NOTE — Assessment & Plan Note (Signed)
Recent ECHO with MVP with mitral regurgitation.  Saw Dr Fletcher Anon.  Recommended yearly f/u ECHOs.  Currently doing well.

## 2014-06-09 NOTE — Assessment & Plan Note (Signed)
On lexapro.  Increased stress.  Feels she is doing relatively well.  Seeing a Social worker.  Follow.

## 2014-06-09 NOTE — Assessment & Plan Note (Signed)
Mammogram 03/28/14 - Birads I.  PAP 06/07/14.

## 2014-06-09 NOTE — Assessment & Plan Note (Signed)
Back and right leg pain as outlined.  Still flares intermittently.  Stretches.  Follow.

## 2014-06-09 NOTE — Assessment & Plan Note (Signed)
On lexapro.  Seeing a Social worker.  Follow.

## 2014-06-09 NOTE — Assessment & Plan Note (Addendum)
On ergocalciferol.  Follow.  Seeing Dr Jefm Bryant.

## 2014-06-11 NOTE — Progress Notes (Signed)
LVM 02/02

## 2014-06-12 ENCOUNTER — Encounter: Payer: Self-pay | Admitting: Internal Medicine

## 2014-06-12 LAB — CYTOLOGY - PAP

## 2014-06-24 ENCOUNTER — Other Ambulatory Visit: Payer: 59

## 2014-06-28 ENCOUNTER — Ambulatory Visit: Payer: 59 | Admitting: Nurse Practitioner

## 2014-09-27 ENCOUNTER — Encounter: Payer: Self-pay | Admitting: Internal Medicine

## 2014-09-27 ENCOUNTER — Encounter: Payer: Self-pay | Admitting: Nurse Practitioner

## 2014-09-27 ENCOUNTER — Ambulatory Visit (INDEPENDENT_AMBULATORY_CARE_PROVIDER_SITE_OTHER): Payer: 59 | Admitting: Nurse Practitioner

## 2014-09-27 ENCOUNTER — Other Ambulatory Visit: Payer: Self-pay | Admitting: Nurse Practitioner

## 2014-09-27 VITALS — BP 100/62 | HR 90 | Temp 97.5°F | Resp 12 | Ht 65.9 in | Wt 151.4 lb

## 2014-09-27 DIAGNOSIS — M5416 Radiculopathy, lumbar region: Secondary | ICD-10-CM | POA: Diagnosis not present

## 2014-09-27 MED ORDER — PREDNISONE 10 MG PO TABS: ORAL_TABLET | ORAL | Status: DC

## 2014-09-27 NOTE — Progress Notes (Signed)
   Subjective:    Patient ID: Alyssa Bautista, female    DOB: 05-22-1970, 44 y.o.   MRN: [id number]  HPI  Alyssa Bautista is a 44 yo female with a CC of leg pain from back to right foot.   1) Back pain on and off for years, she reports. Saw Dr. Nicki Bautista, referred her to Dr. Ronnald Bautista who then referred her to Dr. Maryjean Bautista. She saw him last year and had ESI's.    Back pain/ leg pain x 4 days   Dr. Maryjean Bautista- 2 ESI's last July   Review of Systems  Constitutional: Negative for fever, chills, diaphoresis and fatigue.  Respiratory: Negative for chest tightness, shortness of breath and wheezing.   Cardiovascular: Negative for chest pain, palpitations and leg swelling.  Gastrointestinal: Negative for nausea, vomiting and diarrhea.  Musculoskeletal: Positive for back pain.  Skin: Negative for rash.  Neurological: Negative for dizziness, weakness, numbness and headaches.       Denies losing bowel/bladder function, saddle anesthesia, or weakness of extremities.   Psychiatric/Behavioral: The patient is not nervous/anxious.       Objective:   Physical Exam  Constitutional: She is oriented to person, place, and time. She appears well-developed and well-nourished. No distress.  BP 100/62 mmHg  Pulse 90  Temp(Src) 97.5 F (36.4 C)  Resp 12  Ht 5' 5.9" (1.674 m)  Wt 151 lb 6.4 oz (68.675 kg)  BMI 24.51 kg/m2  SpO2 98%  LMP 02/24/2011   HENT:  Head: Normocephalic and atraumatic.  Right Ear: External ear normal.  Left Ear: External ear normal.  Cardiovascular: Normal rate, regular rhythm, normal heart sounds and intact distal pulses.  Exam reveals no gallop and no friction rub.   No murmur heard. Pulmonary/Chest: Effort normal and breath sounds normal. No respiratory distress. She has no wheezes. She has no rales. She exhibits no tenderness.  Musculoskeletal:  Iliopsoas 5/5 Bilateral, Tib anterior 5/5 bilateral, EHL 5/5 bilateral, no ankle clonus, intact heel/toe/sequential walking, sensation  intact upper and lower extremities. Straight leg raise negative bilaterally.   Neurological: She is alert and oriented to person, place, and time. No cranial nerve deficit. She exhibits normal muscle tone. Coordination normal.  Skin: Skin is warm and dry. No rash noted. She is not diaphoretic.  Psychiatric: She has a normal mood and affect. Her behavior is normal. Judgment and thought content normal.      Assessment & Plan:

## 2014-09-27 NOTE — Assessment & Plan Note (Signed)
Prednisone taper given to pt. Helpful in past. Will refer back to [facility] at patient's request. FU prn worsening/failure to improve.

## 2014-09-27 NOTE — Progress Notes (Signed)
Pre visit review using our clinic review tool, if applicable. No additional management support is needed unless otherwise documented below in the visit note. 

## 2014-09-27 NOTE — Patient Instructions (Signed)
Prednisone with breakfast  6 tablets on day 1, 5 tablets on day 2, 4 tablets on day 3, 3 tablets on day 4, 2 tablets day 5, 1 tablet on day 6...done!   Call us if not helpful, worsening Seek emergency care if loss or bladder function/weakness in your leg.   We will contact you about your referral to Dr. Maryjean Ka in a week or two.

## 2014-09-29 NOTE — Telephone Encounter (Signed)
Noted.  Pt evaluated.  Given prednisone.

## 2014-10-11 ENCOUNTER — Ambulatory Visit (INDEPENDENT_AMBULATORY_CARE_PROVIDER_SITE_OTHER): Payer: 59 | Admitting: Internal Medicine

## 2014-10-11 ENCOUNTER — Encounter: Payer: Self-pay | Admitting: Internal Medicine

## 2014-10-11 VITALS — BP 108/71 | HR 81 | Temp 98.6°F | Ht 65.9 in

## 2014-10-11 DIAGNOSIS — M5416 Radiculopathy, lumbar region: Secondary | ICD-10-CM | POA: Diagnosis not present

## 2014-10-11 DIAGNOSIS — M5431 Sciatica, right side: Secondary | ICD-10-CM

## 2014-10-11 DIAGNOSIS — F439 Reaction to severe stress, unspecified: Secondary | ICD-10-CM

## 2014-10-11 DIAGNOSIS — G40909 Epilepsy, unspecified, not intractable, without status epilepticus: Secondary | ICD-10-CM

## 2014-10-11 DIAGNOSIS — G43809 Other migraine, not intractable, without status migrainosus: Secondary | ICD-10-CM

## 2014-10-11 DIAGNOSIS — F32A Depression, unspecified: Secondary | ICD-10-CM

## 2014-10-11 DIAGNOSIS — Z658 Other specified problems related to psychosocial circumstances: Secondary | ICD-10-CM

## 2014-10-11 DIAGNOSIS — Z0000 Encounter for general adult medical examination without abnormal findings: Secondary | ICD-10-CM

## 2014-10-11 DIAGNOSIS — F329 Major depressive disorder, single episode, unspecified: Secondary | ICD-10-CM

## 2014-10-11 MED ORDER — HYDROCODONE-ACETAMINOPHEN 5-325 MG PO TABS: 1.0000 | ORAL_TABLET | Freq: Three times a day (TID) | ORAL | Status: DC | PRN

## 2014-10-11 MED ORDER — LORAZEPAM 0.5 MG PO TABS: ORAL_TABLET | ORAL | Status: DC

## 2014-10-11 NOTE — Progress Notes (Signed)
Pre visit review using our clinic review tool, if applicable. No additional management support is needed unless otherwise documented below in the visit note. 

## 2014-10-13 ENCOUNTER — Encounter: Payer: Self-pay | Admitting: Internal Medicine

## 2014-10-13 NOTE — Assessment & Plan Note (Signed)
See plan as outlined.  Medrol dose pack and norco as directed.  Keep appt with Dr Lovenia Shuck

## 2014-10-13 NOTE — Assessment & Plan Note (Signed)
Mammogram 03/28/14 - Birads I.  PAP 06/07/14.

## 2014-10-13 NOTE — Assessment & Plan Note (Signed)
With increased right low back pain and pain down posterior right leg.  Has seen Dr Lovenia Shuck previously.  Planning to see soon.  With increased pain.  Needs something for control of pain.  Recently had prednisone taper.  Did not help.  Will try another trial of steroid.  Medrol dose pack as directed.  norco as directed.  Follow.  Keep appt with Dr Lovenia Shuck.

## 2014-10-13 NOTE — Assessment & Plan Note (Signed)
Discussed with her today.  Refilled lorazepam.  Follow.  Instructed not to take with the norco.

## 2014-10-13 NOTE — Assessment & Plan Note (Signed)
Has overall been better since her hysterectomy.  Recently had flare.  Was evaluated - UC.  Given imitrex.  Helped.  Follow.

## 2014-10-13 NOTE — Assessment & Plan Note (Signed)
On lexapro.  Seeing a Social worker.  Follow.

## 2014-10-13 NOTE — Assessment & Plan Note (Signed)
Doing well on dilantin.  Sees neurology.  Check cbc and liver panel.

## 2014-10-13 NOTE — Progress Notes (Signed)
Patient ID: Alyssa Bautista, female   DOB: 20-Jan-1971, 44 y.o.   MRN: [id number]   Subjective:    Patient ID: Alyssa Bautista, female    DOB: 07/25/1970, 44 y.o.   MRN: [id number]  HPI  Patient here for a scheduled follow up.  Increased stress with her mother's medical issues.  On lexapro.  Takes lorazepam prn.  Request refill.  Reports increased right lower back pain and pain extending down posterior right leg.  Increased pain.  Does not appear to affect her when walking.  No weakness.  Eating and drinking well.  Tries to stay active.  Breathing stable.  Bowels stable.     Past Medical History  Diagnosis Date  . Arthritis   . Depression   . Gestational diabetes   . Environmental allergies   . Migraines   . History of chicken pox   . Seizures   . MVP (mitral valve prolapse)     Outpatient Encounter Prescriptions as of 10/11/2014  Medication Sig  . acetaminophen (TYLENOL) 500 MG tablet Take 500 mg by mouth as needed.  Marland Kitchen aspirin 325 MG EC tablet Take 325 mg by mouth every 6 (six) hours as needed for pain.  Marland Kitchen escitalopram (LEXAPRO) 10 MG tablet Take 1 1/2 tablets q day  . levocetirizine (XYZAL) 5 MG tablet Take 5 mg by mouth every evening.  Marland Kitchen LORazepam (ATIVAN) 0.5 MG tablet 1/2 tablet q day prn  . montelukast (SINGULAIR) 10 MG tablet Take 10 mg by mouth at bedtime.  . phenytoin (DILANTIN) 100 MG ER capsule Take by mouth 3 (three) times daily.  . [DISCONTINUED] LORazepam (ATIVAN) 0.5 MG tablet 1/2 tablet q day prn  . HYDROcodone-acetaminophen (NORCO) 5-325 MG per tablet Take 1 tablet by mouth 3 (three) times daily as needed for moderate pain.  . [DISCONTINUED] predniSONE (DELTASONE) 10 MG tablet Take 6 tablets by mouth on day 1 with breakfast then decrease by 1 tablet each day until gone.   No facility-administered encounter medications on file as of 10/11/2014.    Review of Systems  Constitutional: Negative for appetite change and unexpected weight change.  HENT:  Negative for congestion and sinus pressure.   Respiratory: Negative for cough, chest tightness and shortness of breath.   Cardiovascular: Negative for chest pain, palpitations and leg swelling.  Gastrointestinal: Negative for nausea, vomiting, abdominal pain and diarrhea.  Musculoskeletal: Positive for back pain (right lower back pain and pain extending down right leg as outlined.  ).  Skin: Negative for color change and rash.  Neurological: Negative for dizziness, light-headedness and headaches.  Hematological: Negative for adenopathy. Does not bruise/bleed easily.  Psychiatric/Behavioral: Negative for dysphoric mood and agitation.       Objective:    Physical Exam  Constitutional: She appears well-developed and well-nourished. No distress.  HENT:  Nose: Nose normal.  Mouth/Throat: Oropharynx is clear and moist.  Neck: Neck supple. No thyromegaly present.  Cardiovascular: Normal rate and regular rhythm.   Pulmonary/Chest: Breath sounds normal. No respiratory distress. She has no wheezes.  Abdominal: Soft. Bowel sounds are normal. There is no tenderness.  Musculoskeletal: She exhibits no edema or tenderness.  Some increased discomfort with straight leg raise.  Motor strength appears to be equal bilaterally.    Lymphadenopathy:    She has no cervical adenopathy.  Skin: No rash noted. No erythema.  Psychiatric: She has a normal mood and affect. Her behavior is normal.    BP 108/71 mmHg  Pulse 81  Temp(Src) 98.6  F (37 C) (Oral)  Ht 5' 5.9" (1.674 m)  SpO2 96%  LMP 02/24/2011 Wt Readings from Last 3 Encounters:  09/27/14 151 lb 6.4 oz (68.675 kg)  06/07/14 149 lb 12 oz (67.926 kg)  05/17/14 148 lb 8 oz (67.359 kg)     Lab Results  Component Value Date   WBC 5.8 03/06/2014   HGB 13.5 03/06/2014   HCT 40.0 03/06/2014   PLT 290.0 03/06/2014   GLUCOSE 82 06/13/2013   CHOL 189 06/13/2013   TRIG 94.0 06/13/2013   HDL 70.70 06/13/2013   LDLCALC 100* 06/13/2013   ALT 16  06/13/2013   AST 17 06/13/2013   NA 140 06/13/2013   K 4.5 06/13/2013   CL 106 06/13/2013   CREATININE 0.6 06/13/2013   BUN 13 06/13/2013   CO2 26 06/13/2013   TSH 2.23 06/13/2013    No results found.     Assessment & Plan:   Problem List Items Addressed This Visit    Depression    On lexapro.  Seeing a Social worker.  Follow.       Relevant Medications   LORazepam (ATIVAN) 0.5 MG tablet   Health care maintenance    Mammogram 03/28/14 - Birads I.  PAP 06/07/14.        Lumbar radicular pain - Primary    See plan as outlined.  Medrol dose pack and norco as directed.  Keep appt with Dr Lovenia Shuck      Migraine headache    Has overall been better since her hysterectomy.  Recently had flare.  Was evaluated - UC.  Given imitrex.  Helped.  Follow.        Relevant Medications   HYDROcodone-acetaminophen (NORCO) 5-325 MG per tablet   Sciatica    With increased right low back pain and pain down posterior right leg.  Has seen Dr Lovenia Shuck previously.  Planning to see soon.  With increased pain.  Needs something for control of pain.  Recently had prednisone taper.  Did not help.  Will try another trial of steroid.  Medrol dose pack as directed.  norco as directed.  Follow.  Keep appt with Dr Lovenia Shuck.        Relevant Medications   LORazepam (ATIVAN) 0.5 MG tablet   Seizure disorder    Doing well on dilantin.  Sees neurology.  Check cbc and liver panel.       Relevant Orders   CBC with Differential/Platelet   Stress    Discussed with her today.  Refilled lorazepam.  Follow.  Instructed not to take with the norco.          I spent 25 minutes with the patient and more than 50% of the time was spent in consultation regarding the above.     Einar Pheasant, MD

## 2014-10-17 ENCOUNTER — Encounter: Payer: Self-pay | Admitting: Internal Medicine

## 2014-10-17 ENCOUNTER — Other Ambulatory Visit: Payer: Self-pay | Admitting: Internal Medicine

## 2014-10-17 NOTE — Telephone Encounter (Signed)
Refilled lexapro #45 with 2 refills.

## 2014-10-17 NOTE — Telephone Encounter (Signed)
Last OV 6.3.16, last refill 5.5.16.  Please advise refill

## 2014-10-21 ENCOUNTER — Other Ambulatory Visit (INDEPENDENT_AMBULATORY_CARE_PROVIDER_SITE_OTHER): Payer: 59

## 2014-10-21 DIAGNOSIS — F329 Major depressive disorder, single episode, unspecified: Secondary | ICD-10-CM

## 2014-10-21 DIAGNOSIS — Z13220 Encounter for screening for lipoid disorders: Secondary | ICD-10-CM | POA: Diagnosis not present

## 2014-10-21 DIAGNOSIS — G40909 Epilepsy, unspecified, not intractable, without status epilepticus: Secondary | ICD-10-CM | POA: Diagnosis not present

## 2014-10-21 DIAGNOSIS — F32A Depression, unspecified: Secondary | ICD-10-CM

## 2014-10-21 LAB — COMPREHENSIVE METABOLIC PANEL
ALBUMIN: 4.2 g/dL (ref 3.5–5.2)
ALT: 18 U/L (ref 0–35)
AST: 14 U/L (ref 0–37)
Alkaline Phosphatase: 81 U/L (ref 39–117)
BILIRUBIN TOTAL: 0.3 mg/dL (ref 0.2–1.2)
BUN: 7 mg/dL (ref 6–23)
CO2: 28 meq/L (ref 19–32)
Calcium: 9 mg/dL (ref 8.4–10.5)
Chloride: 106 mEq/L (ref 96–112)
Creatinine, Ser: 0.74 mg/dL (ref 0.40–1.20)
GFR: 91.6 mL/min (ref >60.00)
Glucose, Bld: 86 mg/dL (ref 70–99)
POTASSIUM: 4.2 meq/L (ref 3.5–5.1)
SODIUM: 140 meq/L (ref 135–145)
TOTAL PROTEIN: 7.2 g/dL (ref 6.0–8.3)

## 2014-10-21 LAB — LIPID PANEL
CHOL/HDL RATIO: 3
Cholesterol: 220 mg/dL — ABNORMAL HIGH (ref 0–200)
HDL: 68.5 mg/dL (ref >39.00)
LDL CALC: 129 mg/dL — AB (ref 0–99)
NONHDL: 151.5
TRIGLYCERIDES: 112 mg/dL (ref 0.0–149.0)
VLDL: 22.4 mg/dL (ref 0.0–40.0)

## 2014-10-21 LAB — CBC WITH DIFFERENTIAL/PLATELET
BASOS PCT: 0.3 % (ref 0.0–3.0)
Basophils Absolute: 0 10*3/uL (ref 0.0–0.1)
EOS ABS: 0.1 10*3/uL (ref 0.0–0.7)
EOS PCT: 1.6 % (ref 0.0–5.0)
HEMATOCRIT: 40 % (ref 36.0–46.0)
Hemoglobin: 13.7 g/dL (ref 12.0–15.0)
LYMPHS ABS: 1.6 10*3/uL (ref 0.7–4.0)
Lymphocytes Relative: 24.3 % (ref 12.0–46.0)
MCHC: 34.2 g/dL (ref 30.0–36.0)
MCV: 97.4 fl (ref 78.0–100.0)
Monocytes Absolute: 0.5 10*3/uL (ref 0.1–1.0)
Monocytes Relative: 7.9 % (ref 3.0–12.0)
Neutro Abs: 4.3 10*3/uL (ref 1.4–7.7)
Neutrophils Relative %: 65.9 % (ref 43.0–77.0)
Platelets: 279 10*3/uL (ref 150.0–400.0)
RBC: 4.11 Mil/uL (ref 3.87–5.11)
RDW: 13.6 % (ref 11.5–15.5)
WBC: 6.6 10*3/uL (ref 4.0–10.5)

## 2014-10-21 LAB — TSH: TSH: 3.07 u[IU]/mL (ref 0.35–4.50)

## 2014-10-21 LAB — VITAMIN B12: Vitamin B-12: 181 pg/mL — ABNORMAL LOW (ref 211–911)

## 2014-10-22 ENCOUNTER — Encounter: Payer: Self-pay | Admitting: *Deleted

## 2014-10-22 LAB — PHENYTOIN LEVEL, TOTAL: Phenytoin Lvl: 8.2 ug/mL — ABNORMAL LOW (ref 10.0–20.0)

## 2014-10-29 ENCOUNTER — Telehealth: Payer: Self-pay

## 2014-10-29 ENCOUNTER — Other Ambulatory Visit: Payer: Self-pay

## 2014-10-29 ENCOUNTER — Ambulatory Visit (INDEPENDENT_AMBULATORY_CARE_PROVIDER_SITE_OTHER): Payer: 59

## 2014-10-29 DIAGNOSIS — E538 Deficiency of other specified B group vitamins: Secondary | ICD-10-CM

## 2014-10-29 MED ORDER — CYANOCOBALAMIN 1000 MCG/ML IJ SOLN
1000.0000 ug | Freq: Once | INTRAMUSCULAR | Status: AC
  Administered 2014-10-29: 1000 ug via INTRAMUSCULAR

## 2014-10-29 MED ORDER — CYANOCOBALAMIN 1000 MCG/ML IJ SOLN: 1000.0000 ug | Freq: Once | INTRAMUSCULAR | Status: DC

## 2014-10-29 MED ORDER — "SYRINGE/NEEDLE (DISP) 25G X 1"" 3 ML MISC": Status: DC

## 2014-10-29 NOTE — Progress Notes (Signed)
Patient came in for start of B12 injections.  Discussed and reviewed B12 injections with the patient.  Patient has a family member that is a Marine scientist and would like to have future shots done at home if possible.  I will send a separate note to Dr. Nicki Reaper in reference to this.   Patient received injection in Let deltoid.  Patient tolerated well.

## 2014-10-29 NOTE — Telephone Encounter (Signed)
I am ok if she gets her B12 injections at home.  If family member is RN- should be able to give injection.  We just usually make sure the person giving them is giving them correctly.  Ok to send in rx for B12 injections (for 1 year) if she desires to do at home.  Let me know if any problems.

## 2014-10-29 NOTE — Telephone Encounter (Signed)
Patient came in for B12 injections  Today and voiced concerns with doing the injections at home instead of coming into the office.  She has a family member that is a Therapist, sports and can give her the shot.  I had her schedule the next injection next week, but told her I would work on getting the prescriptions to the pharmacy ([facility]) to start the following week at home.  Please advise

## 2014-10-29 NOTE — Telephone Encounter (Signed)
Spoke with the patient and informed her that the prescription had been sent to the [facility] and to start the injections next week.  Plan to do weekly injections for 3 weeks and then once a month for the next year.  Supplies sent to pharmacy also.  She will follow up up with any additional questions.

## 2014-11-11 ENCOUNTER — Telehealth: Payer: Self-pay | Admitting: Family

## 2014-11-11 DIAGNOSIS — J069 Acute upper respiratory infection, unspecified: Secondary | ICD-10-CM

## 2014-11-11 MED ORDER — BENZONATATE 200 MG PO CAPS: 200.0000 mg | ORAL_CAPSULE | Freq: Three times a day (TID) | ORAL | Status: DC | PRN

## 2014-11-11 MED ORDER — DOXYCYCLINE HYCLATE 100 MG PO TABS: 100.0000 mg | ORAL_TABLET | Freq: Two times a day (BID) | ORAL | Status: DC

## 2014-11-11 NOTE — Progress Notes (Signed)
We are sorry that you are not feeling well.  Here is how we plan to help!  Based on what you have shared with me it looks like you have upper respiratory tract inflammation that has resulted in a significant cough.  Inflammation and infection in the upper respiratory tract is commonly called bronchitis and has four common causes:  Allergies, Viral Infections, Acid Reflux and Bacterial Infections.  Allergies, viruses and acid reflux are treated by controlling symptoms or eliminating the cause. An example might be a cough caused by taking certain blood pressure medications. You stop the cough by changing the medication. Another example might be a cough caused by acid reflux. Controlling the reflux helps control the cough.  Based on your presentation I believe you most likely have A cough due to bacteria.  When patients have a fever and a productive cough with a change in color or increased sputum production, we are concerned about bacterial bronchitis.  If left untreated it can progress to pneumonia.  If your symptoms do not improve with your treatment plan it is important that you contact your provider.   I hve prescribed Doxycycline 100 mg twice a day for 7 days    In addition you may use A non-prescription cough medication called Robitussin DAC. Take 2 teaspoons every 8 hours or Delsym: take 2 teaspoons every 12 hours., A non-prescription cough medication called Mucinex DM: take 2 tablets every 12 hours. and A prescription cough medication called Tessalon Perles 100mg . You may take 1-2 capsules every 8 hours as needed for your cough.    HOME CARE . Only take medications as instructed by your medical team. . Complete the entire course of an antibiotic. . Drink plenty of fluids and get plenty of rest. . Avoid close contacts especially the very young and the elderly . Cover your mouth if you cough or cough into your sleeve. . Always remember to wash your hands . A steam or ultrasonic humidifier can help  congestion.    GET HELP RIGHT AWAY IF: . You develop worsening fever. . You become short of breath . You cough up blood. . Your symptoms persist after you have completed your treatment plan MAKE SURE YOU   Understand these instructions.  Will watch your condition.  Will get help right away if you are not doing well or get worse.  Your e-visit answers were reviewed by a board certified advanced clinical practitioner to complete your personal care plan.  Depending on the condition, your plan could have included both over the counter or prescription medications.  If there is a problem please reply  once you have received a response from your provider.  Your safety is important to [place].  If you have drug allergies check your prescription carefully.    You can use MyChart to ask questions about today's visit, request a non-urgent call back, or ask for a work or school excuse.  You will get an e-mail in the next two days asking about your experience.  I hope that your e-visit has been valuable and will speed your recovery. Thank you for using e-visits.

## 2015-01-17 ENCOUNTER — Ambulatory Visit: Payer: 59 | Admitting: Internal Medicine

## 2015-01-22 ENCOUNTER — Other Ambulatory Visit: Payer: Self-pay | Admitting: Internal Medicine

## 2015-02-11 ENCOUNTER — Telehealth: Payer: 59 | Admitting: Nurse Practitioner

## 2015-02-11 DIAGNOSIS — J0101 Acute recurrent maxillary sinusitis: Secondary | ICD-10-CM | POA: Diagnosis not present

## 2015-02-11 DIAGNOSIS — J069 Acute upper respiratory infection, unspecified: Secondary | ICD-10-CM | POA: Diagnosis not present

## 2015-02-11 MED ORDER — DOXYCYCLINE HYCLATE 100 MG PO TABS: 100.0000 mg | ORAL_TABLET | Freq: Two times a day (BID) | ORAL | Status: DC

## 2015-02-11 NOTE — Progress Notes (Signed)
We are sorry that you are not feeling well.  Here is how we plan to help!  Based on what you have shared with me it looks like you have sinusitis.  Sinusitis is inflammation and infection in the sinus cavities of the head.  Based on your presentation I believe you most likely have Acute Bacterial sinusitis.  This is an infection caused by bacteria and is treated with antibiotics.  I have prescribed Doxycycline 100mg  by mouth twice a day for 10 days.. You may use an oral decongestant such as Mucinex D or if you have glaucoma or high blood pressure use plain Mucinex.  Saline nasal sprays help and can safely be used as often as needed for congestion. If you develop worsening sinus pain, fever or notice severe headache and vision changes, or if symptoms are not better after completion of antibiotic, please schedule an appointment with a health care provider.  Tessalon perles which you have already tried for your cough is all i can offer you for cough in an e visit. Sorry.You can try some nyquil and that may help you sleep.  Sinus infections are not as easily transmitted as other respiratory infection, however we still recommend that you avoid close contact with loved ones, especially the very young and elderly.  Remember to wash your hands thoroughly throughout the day as this is the number one way to prevent the spread of infection!  Home Care:  Only take medications as instructed by your medical team.  Complete the entire course of an antibiotic.  Do not take these medications with alcohol.  A steam or ultrasonic humidifier can help congestion.  You can place a towel over your head and breathe in the steam from hot water coming from a faucet.  Avoid close contacts especially the very young and the elderly.  Cover your mouth when you cough or sneeze.  Always remember to wash your hands.  Get Help Right Away If:  You develop worsening fever or sinus pain.  You develop a severe head ache or  visual changes.  Your symptoms persist after you have completed your treatment plan.  Make sure you  Understand these instructions.  Will watch your condition.  Will get help right away if you are not doing well or get worse.  Your e-visit answers were reviewed by a board certified advanced clinical practitioner to complete your personal care plan.  Depending on the condition, your plan could have included both over the counter or prescription medications.  If there is a problem please reply  once you have received a response from your provider.  Your safety is important to [place].  If you have drug allergies check your prescription carefully.    You can use MyChart to ask questions about today's visit, request a non-urgent call back, or ask for a work or school excuse for 24 hours related to this e-Visit. If it has been greater than 24 hours you will need to follow up with your provider, or enter a new e-Visit to address those concerns.  You will get an e-mail in the next two days asking about your experience.  I hope that your e-visit has been valuable and will speed your recovery. Thank you for using e-visits.

## 2015-02-20 ENCOUNTER — Ambulatory Visit (INDEPENDENT_AMBULATORY_CARE_PROVIDER_SITE_OTHER): Payer: 59 | Admitting: Internal Medicine

## 2015-02-20 ENCOUNTER — Encounter: Payer: Self-pay | Admitting: Internal Medicine

## 2015-02-20 VITALS — BP 100/80 | HR 88 | Temp 98.6°F | Resp 18 | Ht 65.9 in | Wt 149.5 lb

## 2015-02-20 DIAGNOSIS — F439 Reaction to severe stress, unspecified: Secondary | ICD-10-CM

## 2015-02-20 DIAGNOSIS — M5416 Radiculopathy, lumbar region: Secondary | ICD-10-CM

## 2015-02-20 DIAGNOSIS — G43809 Other migraine, not intractable, without status migrainosus: Secondary | ICD-10-CM

## 2015-02-20 DIAGNOSIS — F329 Major depressive disorder, single episode, unspecified: Secondary | ICD-10-CM | POA: Diagnosis not present

## 2015-02-20 DIAGNOSIS — G40909 Epilepsy, unspecified, not intractable, without status epilepticus: Secondary | ICD-10-CM

## 2015-02-20 DIAGNOSIS — E559 Vitamin D deficiency, unspecified: Secondary | ICD-10-CM

## 2015-02-20 DIAGNOSIS — Z658 Other specified problems related to psychosocial circumstances: Secondary | ICD-10-CM

## 2015-02-20 DIAGNOSIS — F32A Depression, unspecified: Secondary | ICD-10-CM

## 2015-02-20 DIAGNOSIS — Z23 Encounter for immunization: Secondary | ICD-10-CM

## 2015-02-20 MED ORDER — LORAZEPAM 0.5 MG PO TABS: ORAL_TABLET | ORAL | Status: DC

## 2015-02-20 MED ORDER — PHENYTOIN SODIUM EXTENDED 100 MG PO CAPS: 100.0000 mg | ORAL_CAPSULE | Freq: Three times a day (TID) | ORAL | Status: AC

## 2015-02-20 NOTE — Progress Notes (Signed)
Patient ID: [name], female   DOB: 1971-04-17, 44 y.o.   MRN: [id number]   Subjective:    Patient ID: [name], female    DOB: December 11, 1970, 44 y.o.   MRN: [id number]  HPI  Patient with past history of migraine headache, seizure disorder, back and leg pain and depression.  She comes in today to follow up on these issues.  She is under a lot of stress with her mother's health issues.  Also, her daughter is applying to colleges.  Overall she feels she is handling things relatively well.  Plans to restart her counseling through her work.  Has adjusted her diet.  Has cut out pepsi. Feels better.  Back and leg doing better.  Has changed shoes.  Is s/p injection.  Doing better.  No cardiac symptoms with increased activity or exertion.  No sob.  No acid reflux.  No abdominal pain or cramping.  Bowels stable. No seizures.  Her neurologist retired.  Discussed scheduling with a different neurologist.     Past Medical History  Diagnosis Date  . Arthritis   . Depression   . Gestational diabetes   . Environmental allergies   . Migraines   . History of chicken pox   . Seizures ([place])   . MVP (mitral valve prolapse)    Past Surgical History  Procedure Laterality Date  . Abdominal hysterectomy  02/2011    abnormal pap (stage III dysplasia).   ovaries not removed.  [facility]  2008  . Dilation and curettage of uterus  2002, 2005  . Wisdom tooth extraction  2003  . Abscess removal  2007    cervix    Family History  Problem Relation Age of Onset  . Hypercholesterolemia Mother   . Hypercholesterolemia Father   . Diabetes Paternal Grandfather   . Prostate cancer Paternal Grandfather   . Breast cancer Maternal Aunt   . Colon cancer Neg Hx    Social History   Social History  . Marital Status: Single    Spouse Name: N/A  . Number of Children: 2  . Years of Education: N/A   Social History Main Topics  . Smoking status: Never Smoker   . Smokeless tobacco: Never Used  .  Alcohol Use: 0.0 oz/week    0 Standard drinks or equivalent per week     Comment: rarely  . Drug Use: No  . Sexual Activity: Not Asked   Other Topics Concern  . None   Social History Narrative    Outpatient Encounter Prescriptions as of 02/20/2015  Medication Sig  . acetaminophen (TYLENOL) 500 MG tablet Take 500 mg by mouth as needed.  Marland Kitchen aspirin 325 MG EC tablet Take 325 mg by mouth every 6 (six) hours as needed for pain.  . cyanocobalamin (,VITAMIN B-12,) 1000 MCG/ML injection Inject 1 mL (1,000 mcg total) into the muscle once. Give 1 shot weekly for 4 weeks and then monthly administration  . escitalopram (LEXAPRO) 10 MG tablet TAKE 1 AND 1/2 TABLETS BY MOUTH DAILY  . levocetirizine (XYZAL) 5 MG tablet Take 5 mg by mouth every evening.  Marland Kitchen LORazepam (ATIVAN) 0.5 MG tablet 1/2 tablet q day prn  . montelukast (SINGULAIR) 10 MG tablet Take 10 mg by mouth at bedtime.  . phenytoin (DILANTIN) 100 MG ER capsule Take 1 capsule (100 mg total) by mouth 3 (three) times daily.  . SYRINGE-NEEDLE, DISP, 3 ML 25G X 1" 3 ML MISC Use syringe/needle for each dose of B-12.  Per B-12 orders.  . [DISCONTINUED] LORazepam (ATIVAN) 0.5 MG tablet 1/2 tablet q day prn  . [DISCONTINUED] phenytoin (DILANTIN) 100 MG ER capsule Take by mouth 3 (three) times daily.  . [DISCONTINUED] benzonatate (TESSALON) 200 MG capsule Take 1 capsule (200 mg total) by mouth 3 (three) times daily as needed. (Patient not taking: Reported on 02/20/2015)  . [DISCONTINUED] doxycycline (VIBRA-TABS) 100 MG tablet Take 1 tablet (100 mg total) by mouth 2 (two) times daily. (Patient not taking: Reported on 02/20/2015)  . [DISCONTINUED] HYDROcodone-acetaminophen (NORCO) 5-325 MG per tablet Take 1 tablet by mouth 3 (three) times daily as needed for moderate pain. (Patient not taking: Reported on 02/20/2015)   No facility-administered encounter medications on file as of 02/20/2015.    Review of Systems  Constitutional: Negative for appetite  change and unexpected weight change.  HENT: Negative for congestion and sore throat.   Eyes: Negative for discharge and visual disturbance.  Respiratory: Negative for cough, chest tightness and shortness of breath.   Cardiovascular: Negative for chest pain, palpitations and leg swelling.  Gastrointestinal: Negative for nausea, vomiting, abdominal pain and diarrhea.  Genitourinary: Negative for dysuria and difficulty urinating.  Musculoskeletal:       Back and leg pain doing better.    Skin: Negative for color change and rash.  Neurological: Negative for dizziness, light-headedness and headaches.  Psychiatric/Behavioral: Negative for dysphoric mood and agitation.       Increased stress as outlined.         Objective:    Physical Exam  Constitutional: She appears well-developed and well-nourished. No distress.  HENT:  Nose: Nose normal.  Mouth/Throat: Oropharynx is clear and moist.  Eyes: Conjunctivae are normal. Right eye exhibits no discharge. Left eye exhibits no discharge.  Neck: Neck supple. No thyromegaly present.  Cardiovascular: Normal rate and regular rhythm.   Pulmonary/Chest: Breath sounds normal. No respiratory distress. She has no wheezes.  Abdominal: Soft. Bowel sounds are normal. There is no tenderness.  Musculoskeletal: She exhibits no edema or tenderness.  Lymphadenopathy:    She has no cervical adenopathy.  Skin: No rash noted. No erythema.  Psychiatric: She has a normal mood and affect. Her behavior is normal.    BP 100/80 mmHg  Pulse 88  Temp(Src) 98.6 F (37 C) (Oral)  Resp 18  Ht 5' 5.9" (1.674 m)  Wt 149 lb 8 oz (67.813 kg)  BMI 24.20 kg/m2  SpO2 97%  LMP 02/24/2011 Wt Readings from Last 3 Encounters:  02/20/15 149 lb 8 oz (67.813 kg)  09/27/14 151 lb 6.4 oz (68.675 kg)  06/07/14 149 lb 12 oz (67.926 kg)     Lab Results  Component Value Date   WBC 6.6 10/21/2014   HGB 13.7 10/21/2014   HCT 40.0 10/21/2014   PLT 279.0 10/21/2014   GLUCOSE  86 10/21/2014   CHOL 220* 10/21/2014   TRIG 112.0 10/21/2014   HDL 68.50 10/21/2014   LDLCALC 129* 10/21/2014   ALT 18 10/21/2014   AST 14 10/21/2014   NA 140 10/21/2014   K 4.2 10/21/2014   CL 106 10/21/2014   CREATININE 0.74 10/21/2014   BUN 7 10/21/2014   CO2 28 10/21/2014   TSH 3.07 10/21/2014       Assessment & Plan:   Problem List Items Addressed This Visit    Depression    On lexapro.  Plans to see a counselor.  Does not feel she needs anything more at this time.  Follow.  Relevant Medications   LORazepam (ATIVAN) 0.5 MG tablet   Lumbar radicular pain    Is doing better.  Is s/p injection.  New shoes.  Follow.        Migraine headache    Doing well.  Not a significant issue for her now.  Follow.       Relevant Medications   phenytoin (DILANTIN) 100 MG ER capsule   Seizure disorder ([place])    Has been followed by neurology in [place].  Her neurologist retired.  Will schedule to f/u with Dr Manuella Ghazi.  On dilantin.  Request refill until can establish with Dr Manuella Ghazi.        Relevant Orders   Ambulatory referral to Neurology   Stress    Increased stress as outlined.  Discussed with her today.  Plans to f/u with a counselor.  Continue lexapro.  Follow.       Vitamin D deficiency    Vitamin D supplements.         Other Visit Diagnoses    Encounter for immunization    -  Primary        Einar Pheasant, MD

## 2015-02-20 NOTE — Progress Notes (Signed)
Pre-visit discussion using our clinic review tool. No additional management support is needed unless otherwise documented below in the visit note.  

## 2015-02-20 NOTE — Patient Instructions (Signed)

## 2015-02-23 ENCOUNTER — Encounter: Payer: Self-pay | Admitting: Internal Medicine

## 2015-02-23 NOTE — Assessment & Plan Note (Signed)
On lexapro.  Plans to see a counselor.  Does not feel she needs anything more at this time.  Follow.

## 2015-02-23 NOTE — Assessment & Plan Note (Signed)
Increased stress as outlined.  Discussed with her today.  Plans to f/u with a counselor.  Continue lexapro.  Follow.

## 2015-02-23 NOTE — Assessment & Plan Note (Signed)
Has been followed by neurology in [place].  Her neurologist retired.  Will schedule to f/u with Dr Manuella Ghazi.  On dilantin.  Request refill until can establish with Dr Manuella Ghazi.

## 2015-02-23 NOTE — Assessment & Plan Note (Signed)
Vitamin D supplements.  

## 2015-02-23 NOTE — Assessment & Plan Note (Signed)
Doing well.  Not a significant issue for her now.  Follow.

## 2015-02-23 NOTE — Assessment & Plan Note (Signed)
Is doing better.  Is s/p injection.  New shoes.  Follow.

## 2015-03-10 ENCOUNTER — Encounter: Payer: Self-pay | Admitting: Internal Medicine

## 2015-03-11 ENCOUNTER — Ambulatory Visit: Payer: 59 | Admitting: Family Medicine

## 2015-04-24 ENCOUNTER — Other Ambulatory Visit: Payer: Self-pay | Admitting: Internal Medicine

## 2015-04-24 DIAGNOSIS — Z1231 Encounter for screening mammogram for malignant neoplasm of breast: Secondary | ICD-10-CM

## 2015-04-25 ENCOUNTER — Ambulatory Visit
Admission: RE | Admit: 2015-04-25 | Discharge: 2015-04-25 | Disposition: A | Discharge status: Home / Self Care | Payer: 59 | Source: Ambulatory Visit | Attending: Internal Medicine | Admitting: Internal Medicine

## 2015-04-25 DIAGNOSIS — Z1231 Encounter for screening mammogram for malignant neoplasm of breast: Secondary | ICD-10-CM | POA: Insufficient documentation

## 2015-05-01 ENCOUNTER — Other Ambulatory Visit: Payer: Self-pay | Admitting: Internal Medicine

## 2015-05-01 ENCOUNTER — Encounter: Payer: Self-pay | Admitting: Internal Medicine

## 2015-05-01 NOTE — Telephone Encounter (Signed)
Ok to refill? Last refillled 01/22/15 for #45 with 2 refills. Ok to refill?

## 2015-05-01 NOTE — Telephone Encounter (Signed)
ok'd lexapro #45 with 2 refills.

## 2015-05-13 DIAGNOSIS — J3089 Other allergic rhinitis: Secondary | ICD-10-CM | POA: Diagnosis not present

## 2015-05-13 DIAGNOSIS — J3081 Allergic rhinitis due to animal (cat) (dog) hair and dander: Secondary | ICD-10-CM | POA: Diagnosis not present

## 2015-05-13 DIAGNOSIS — J301 Allergic rhinitis due to pollen: Secondary | ICD-10-CM | POA: Diagnosis not present

## 2015-05-16 ENCOUNTER — Other Ambulatory Visit: Payer: Self-pay | Admitting: Family Medicine

## 2015-05-16 DIAGNOSIS — Z1231 Encounter for screening mammogram for malignant neoplasm of breast: Secondary | ICD-10-CM

## 2015-05-27 DIAGNOSIS — J3089 Other allergic rhinitis: Secondary | ICD-10-CM | POA: Diagnosis not present

## 2015-05-27 DIAGNOSIS — J301 Allergic rhinitis due to pollen: Secondary | ICD-10-CM | POA: Diagnosis not present

## 2015-05-30 ENCOUNTER — Ambulatory Visit: Payer: 59 | Admitting: Cardiovascular Disease

## 2015-05-30 DIAGNOSIS — J3089 Other allergic rhinitis: Secondary | ICD-10-CM | POA: Diagnosis not present

## 2015-05-30 DIAGNOSIS — J301 Allergic rhinitis due to pollen: Secondary | ICD-10-CM | POA: Diagnosis not present

## 2015-05-30 DIAGNOSIS — J3081 Allergic rhinitis due to animal (cat) (dog) hair and dander: Secondary | ICD-10-CM | POA: Diagnosis not present

## 2015-05-31 DIAGNOSIS — G40209 Localization-related (focal) (partial) symptomatic epilepsy and epileptic syndromes with complex partial seizures, not intractable, without status epilepticus: Secondary | ICD-10-CM | POA: Diagnosis not present

## 2015-06-02 ENCOUNTER — Ambulatory Visit (INDEPENDENT_AMBULATORY_CARE_PROVIDER_SITE_OTHER): Payer: 59 | Admitting: Cardiovascular Disease

## 2015-06-02 ENCOUNTER — Encounter: Payer: Self-pay | Admitting: Cardiovascular Disease

## 2015-06-02 VITALS — BP 110/70 | HR 81 | Ht 66.0 in | Wt 147.2 lb

## 2015-06-02 DIAGNOSIS — R011 Cardiac murmur, unspecified: Secondary | ICD-10-CM | POA: Diagnosis not present

## 2015-06-02 DIAGNOSIS — I341 Nonrheumatic mitral (valve) prolapse: Secondary | ICD-10-CM | POA: Diagnosis not present

## 2015-06-02 NOTE — Assessment & Plan Note (Signed)
Most recent evaluation last year showed moderate mitral regurgitation. The heart murmur appears to be of the same quality and clinically she has no symptoms. Thus, I recommend continued clinical observation and repeating echocardiogram every 2-3 years. She will continue to follow-up with me on a yearly basis. No indication for endocarditis prophylaxis.

## 2015-06-02 NOTE — Patient Instructions (Signed)
Medication Instructions: Continue same medications.   Labwork: None.   Procedures/Testing: None.   Follow-Up: 1 year with Dr. Janiece Scovill.   Any Additional Special Instructions Will Be Listed Below (If Applicable).     If you need a refill on your cardiac medications before your next appointment, please call your pharmacy.   

## 2015-06-02 NOTE — Progress Notes (Signed)
Primary care physician: Dr. Nicki Reaper  HPI  45 year old female who is here today for a follow-up visit regarding mitral valve prolapse with moderate mitral regurgitation. She has chronic medical conditions that include migraine headaches, depression and abnormal pap smear requiring hysterectomy.  She was told about a cardiac murmur and she was 45 years old.   echocardiogram in January 2016 showed normal LV systolic function, mild to moderate anterior leaflet prolapse with moderate mitral regurgitation, mildly dilated left atrium with normal pulmonary pressure.   she has been doing very well and denies any chest pain, shortness of breath or palpitations. 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  Allergies  Allergen Reactions  . Penicillins Rash  . Azithromycin Other (See Comments)    GI upset  . Dilantin [Phenytoin] Other (See Comments)    By IV, Vasovagil reaction  . Clindamycin/Lincomycin Rash     Current Outpatient Prescriptions on File Prior to Visit  Medication Sig Dispense Refill  . acetaminophen (TYLENOL) 500 MG tablet Take 500 mg by mouth as needed.    Marland Kitchen aspirin 325 MG EC tablet Take 325 mg by mouth every 6 (six) hours as needed for pain.    . cyanocobalamin (,VITAMIN B-12,) 1000 MCG/ML injection Inject 1 mL (1,000 mcg total) into the muscle once. Give 1 shot weekly for 4 weeks and then monthly administration 1 mL 15  . escitalopram (LEXAPRO) 10 MG tablet TAKE 1 AND 1/2 TABLETS BY MOUTH DAILY 45 tablet 2  . levocetirizine (XYZAL) 5 MG tablet Take 5 mg by mouth every evening.    Marland Kitchen LORazepam (ATIVAN) 0.5 MG tablet 1/2 tablet q day prn 30 tablet 0  . montelukast (SINGULAIR) 10 MG tablet Take 10 mg by mouth at bedtime.    . phenytoin (DILANTIN) 100 MG ER capsule Take 1 capsule (100 mg total) by mouth 3 (three) times daily. 90 capsule 0  . SYRINGE-NEEDLE, DISP, 3 ML 25G X 1" 3 ML MISC Use syringe/needle for each dose of B-12. Per B-12 orders. 20  each 0   No current facility-administered medications on file prior to visit.     Past Medical History  Diagnosis Date  . Arthritis   . Depression   . Gestational diabetes   . Environmental allergies   . Migraines   . History of chicken pox   . Seizures ([place])   . MVP (mitral valve prolapse)      Past Surgical History  Procedure Laterality Date  . Abdominal hysterectomy  02/2011    abnormal pap (stage III dysplasia).   ovaries not removed.  [facility]  2008  . Dilation and curettage of uterus  2002, 2005  . Wisdom tooth extraction  2003  . Abscess removal  2007    cervix      Family History  Problem Relation Age of Onset  . Hypercholesterolemia Mother   . Hypercholesterolemia Father   . Diabetes Paternal Grandfather   . Prostate cancer Paternal Grandfather   . Breast cancer Maternal Aunt     50's  . Colon cancer Neg Hx      Social History   Social History  . Marital Status: Single    Spouse Name: N/A  . Number of Children: 2  . Years of Education: N/A   Occupational History  . Not on file.   Social History Main Topics  . Smoking status: Never Smoker   . Smokeless tobacco: Never Used  .  Alcohol Use: 0.0 oz/week    0 Standard drinks or equivalent per week     Comment: rarely  . Drug Use: No  . Sexual Activity: Not on file   Other Topics Concern  . Not on file   Social History Narrative     ROS A 10 point review of system was performed. It is negative other than that mentioned in the history of present illness.   PHYSICAL EXAM   BP 110/70 mmHg  Pulse 81  Ht 5\' 6"  (1.676 m)  Wt 147 lb 4 oz (66.792 kg)  BMI 23.78 kg/m2  LMP 02/24/2011 Constitutional: She is oriented to person, place, and time. She appears well-developed and well-nourished. No distress.  HENT: No nasal discharge.  Head: Normocephalic and atraumatic.  Eyes: Pupils are equal and round. No discharge.  Neck: Normal range of motion. Neck supple. No JVD present. No thyromegaly  present.  Cardiovascular: Normal rate, regular rhythm, normal heart sounds. Exam reveals no gallop and no friction rub. There is a 2 out of 6 late systolic murmur at the left sternal border  Pulmonary/Chest: Effort normal and breath sounds normal. No stridor. No respiratory distress. She has no wheezes. She has no rales. She exhibits no tenderness.  Abdominal: Soft. Bowel sounds are normal. She exhibits no distension. There is no tenderness. There is no rebound and no guarding.  Musculoskeletal: Normal range of motion. She exhibits no edema and no tenderness.  Neurological: She is alert and oriented to person, place, and time. Coordination normal.  Skin: Skin is warm and dry. No rash noted. She is not diaphoretic. No erythema. No pallor.  Psychiatric: She has a normal mood and affect. Her behavior is normal. Judgment and thought content normal.     [id number]  Rhythm  -  Nonspecific T-abnormality.   ABNORMAL    ASSESSMENT AND PLAN

## 2015-06-03 DIAGNOSIS — J301 Allergic rhinitis due to pollen: Secondary | ICD-10-CM | POA: Diagnosis not present

## 2015-06-03 DIAGNOSIS — J3089 Other allergic rhinitis: Secondary | ICD-10-CM | POA: Diagnosis not present

## 2015-06-03 DIAGNOSIS — J3081 Allergic rhinitis due to animal (cat) (dog) hair and dander: Secondary | ICD-10-CM | POA: Diagnosis not present

## 2015-06-05 ENCOUNTER — Telehealth: Payer: Self-pay | Admitting: *Deleted

## 2015-06-05 NOTE — Telephone Encounter (Signed)
Lmom to call our office to schedule a echocardiogram (12 mth f/u).

## 2015-06-06 DIAGNOSIS — J3089 Other allergic rhinitis: Secondary | ICD-10-CM | POA: Diagnosis not present

## 2015-06-06 DIAGNOSIS — J301 Allergic rhinitis due to pollen: Secondary | ICD-10-CM | POA: Diagnosis not present

## 2015-06-06 DIAGNOSIS — J3081 Allergic rhinitis due to animal (cat) (dog) hair and dander: Secondary | ICD-10-CM | POA: Diagnosis not present

## 2015-06-13 DIAGNOSIS — J3089 Other allergic rhinitis: Secondary | ICD-10-CM | POA: Diagnosis not present

## 2015-06-13 DIAGNOSIS — J301 Allergic rhinitis due to pollen: Secondary | ICD-10-CM | POA: Diagnosis not present

## 2015-06-13 DIAGNOSIS — J3081 Allergic rhinitis due to animal (cat) (dog) hair and dander: Secondary | ICD-10-CM | POA: Diagnosis not present

## 2015-06-17 ENCOUNTER — Ambulatory Visit (INDEPENDENT_AMBULATORY_CARE_PROVIDER_SITE_OTHER): Payer: 59 | Admitting: Internal Medicine

## 2015-06-17 ENCOUNTER — Encounter: Payer: Self-pay | Admitting: Internal Medicine

## 2015-06-17 VITALS — BP 122/80 | HR 95 | Temp 98.3°F | Resp 18 | Ht 66.0 in | Wt 149.0 lb

## 2015-06-17 DIAGNOSIS — G43809 Other migraine, not intractable, without status migrainosus: Secondary | ICD-10-CM

## 2015-06-17 DIAGNOSIS — J301 Allergic rhinitis due to pollen: Secondary | ICD-10-CM | POA: Diagnosis not present

## 2015-06-17 DIAGNOSIS — Z0000 Encounter for general adult medical examination without abnormal findings: Secondary | ICD-10-CM | POA: Diagnosis not present

## 2015-06-17 DIAGNOSIS — Z658 Other specified problems related to psychosocial circumstances: Secondary | ICD-10-CM

## 2015-06-17 DIAGNOSIS — J3081 Allergic rhinitis due to animal (cat) (dog) hair and dander: Secondary | ICD-10-CM | POA: Diagnosis not present

## 2015-06-17 DIAGNOSIS — F439 Reaction to severe stress, unspecified: Secondary | ICD-10-CM

## 2015-06-17 DIAGNOSIS — J3089 Other allergic rhinitis: Secondary | ICD-10-CM | POA: Diagnosis not present

## 2015-06-17 DIAGNOSIS — F329 Major depressive disorder, single episode, unspecified: Secondary | ICD-10-CM

## 2015-06-17 DIAGNOSIS — F32A Depression, unspecified: Secondary | ICD-10-CM

## 2015-06-17 DIAGNOSIS — I341 Nonrheumatic mitral (valve) prolapse: Secondary | ICD-10-CM

## 2015-06-17 DIAGNOSIS — G40909 Epilepsy, unspecified, not intractable, without status epilepticus: Secondary | ICD-10-CM

## 2015-06-17 MED ORDER — LORAZEPAM 0.5 MG PO TABS: ORAL_TABLET | ORAL | Status: DC

## 2015-06-17 MED ORDER — ONDANSETRON HCL 4 MG PO TABS: 4.0000 mg | ORAL_TABLET | Freq: Three times a day (TID) | ORAL | Status: DC | PRN

## 2015-06-17 NOTE — Progress Notes (Signed)
Pre-visit discussion using our clinic review tool. No additional management support is needed unless otherwise documented below in the visit note.  

## 2015-06-17 NOTE — Progress Notes (Signed)
Patient ID: Alyssa Bautista, female   DOB: 12-Dec-1970, 45 y.o.   MRN: [id number]   Subjective:    Patient ID: Alyssa Bautista, female    DOB: Nov 28, 1970, 45 y.o.   MRN: [id number]  HPI  Patient has a history of seizures, mitral regurgitation, migraines and depression.  She comes in today to follow up on these issues as well as for a complete physical exam.  She is having a migraine headache.  States feels like a typical migraine.  Has not been able to lie down.  Usually goes asleep to make them go away.  Tries to stay active.  No cardiac symptoms with increased activity or exertion.  No sob.  No acid reflux.  No abdominal pain or cramping.  Bowels stable.  Just saw cardiology.  Recommended f/u echo in 2-3 years.     Past Medical History  Diagnosis Date  . Arthritis   . Depression   . Gestational diabetes   . Environmental allergies   . Migraines   . History of chicken pox   . Seizures ([place])   . MVP (mitral valve prolapse)    Past Surgical History  Procedure Laterality Date  . Abdominal hysterectomy  02/2011    abnormal pap (stage III dysplasia).   ovaries not removed.  [facility]  2008  . Dilation and curettage of uterus  2002, 2005  . Wisdom tooth extraction  2003  . Abscess removal  2007    cervix    Family History  Problem Relation Age of Onset  . Hypercholesterolemia Mother   . Hypercholesterolemia Father   . Diabetes Paternal Grandfather   . Prostate cancer Paternal Grandfather   . Breast cancer Maternal Aunt     50's  . Colon cancer Neg Hx    Social History   Social History  . Marital Status: Single    Spouse Name: N/A  . Number of Children: 2  . Years of Education: N/A   Social History Main Topics  . Smoking status: Never Smoker   . Smokeless tobacco: Never Used  . Alcohol Use: 0.0 oz/week    0 Standard drinks or equivalent per week     Comment: rarely  . Drug Use: No  . Sexual Activity: Not Asked   Other Topics Concern  . None   Social  History Narrative    Outpatient Encounter Prescriptions as of 06/17/2015  Medication Sig  . acetaminophen (TYLENOL) 500 MG tablet Take 500 mg by mouth as needed.  Marland Kitchen aspirin 325 MG EC tablet Take 325 mg by mouth every 6 (six) hours as needed for pain.  . cyanocobalamin (,VITAMIN B-12,) 1000 MCG/ML injection Inject 1 mL (1,000 mcg total) into the muscle once. Give 1 shot weekly for 4 weeks and then monthly administration  . escitalopram (LEXAPRO) 10 MG tablet TAKE 1 AND 1/2 TABLETS BY MOUTH DAILY  . levocetirizine (XYZAL) 5 MG tablet Take 5 mg by mouth every evening.  Marland Kitchen LORazepam (ATIVAN) 0.5 MG tablet 1/2 tablet q day prn  . montelukast (SINGULAIR) 10 MG tablet Take 10 mg by mouth at bedtime.  . phenytoin (DILANTIN) 100 MG ER capsule Take 1 capsule (100 mg total) by mouth 3 (three) times daily.  . SYRINGE-NEEDLE, DISP, 3 ML 25G X 1" 3 ML MISC Use syringe/needle for each dose of B-12. Per B-12 orders.  . [DISCONTINUED] LORazepam (ATIVAN) 0.5 MG tablet 1/2 tablet q day prn  . ondansetron (ZOFRAN) 4 MG tablet Take 1 tablet (4  mg total) by mouth every 8 (eight) hours as needed for nausea or vomiting.   No facility-administered encounter medications on file as of 06/17/2015.    Review of Systems  Constitutional: Negative for appetite change and unexpected weight change.  HENT: Negative for congestion and sinus pressure.   Eyes: Negative for discharge and redness.  Respiratory: Negative for cough, chest tightness and shortness of breath.   Cardiovascular: Negative for chest pain, palpitations and leg swelling.  Gastrointestinal: Negative for nausea, vomiting, abdominal pain and diarrhea.  Genitourinary: Negative for dysuria and difficulty urinating.  Musculoskeletal: Negative for back pain and joint swelling.  Skin: Negative for color change and rash.  Neurological: Positive for headaches. Negative for dizziness and light-headedness.  Psychiatric/Behavioral: Negative for dysphoric mood and  agitation.       Increased stress with her mother's health, etc.  Overall she feels she is handling things relatively well.         Objective:    Physical Exam  Constitutional: She is oriented to person, place, and time. She appears well-developed and well-nourished. No distress.  HENT:  Nose: Nose normal.  Mouth/Throat: Oropharynx is clear and moist.  Eyes: Conjunctivae are normal. Right eye exhibits no discharge. Left eye exhibits no discharge. No scleral icterus.  Neck: Neck supple. No thyromegaly present.  Cardiovascular: Normal rate and regular rhythm.   Pulmonary/Chest: Breath sounds normal. No accessory muscle usage. No tachypnea. No respiratory distress. She has no decreased breath sounds. She has no wheezes. She has no rhonchi. Right breast exhibits no inverted nipple, no mass, no nipple discharge and no tenderness (no axillary adenopathy). Left breast exhibits no inverted nipple, no mass, no nipple discharge and no tenderness (no axilarry adenopathy).  Abdominal: Soft. Bowel sounds are normal. There is no tenderness.  Musculoskeletal: She exhibits no edema or tenderness.  Lymphadenopathy:    She has no cervical adenopathy.  Neurological: She is alert and oriented to person, place, and time.  Skin: Skin is warm. No rash noted. No erythema.  Psychiatric: She has a normal mood and affect. Her behavior is normal.    BP 122/80 mmHg  Pulse 95  Temp(Src) 98.3 F (36.8 C) (Oral)  Resp 18  Ht 5\' 6"  (1.676 m)  Wt 149 lb (67.586 kg)  BMI 24.06 kg/m2  SpO2 97%  LMP 02/24/2011 Wt Readings from Last 3 Encounters:  06/17/15 149 lb (67.586 kg)  06/02/15 147 lb 4 oz (66.792 kg)  02/20/15 149 lb 8 oz (67.813 kg)     Lab Results  Component Value Date   WBC 6.6 10/21/2014   HGB 13.7 10/21/2014   HCT 40.0 10/21/2014   PLT 279.0 10/21/2014   GLUCOSE 86 10/21/2014   CHOL 220* 10/21/2014   TRIG 112.0 10/21/2014   HDL 68.50 10/21/2014   LDLCALC 129* 10/21/2014   ALT 18  10/21/2014   AST 14 10/21/2014   NA 140 10/21/2014   K 4.2 10/21/2014   CL 106 10/21/2014   CREATININE 0.74 10/21/2014   BUN 7 10/21/2014   CO2 28 10/21/2014   TSH 3.07 10/21/2014    Mm Screening Breast Tomo Bilateral  04/25/2015  CLINICAL DATA:  Screening. EXAM: DIGITAL SCREENING BILATERAL MAMMOGRAM WITH 3D TOMO WITH CAD COMPARISON:  Previous exam(s). ACR Breast Density Category c: The breast tissue is heterogeneously dense, which may obscure small masses. FINDINGS: There are no findings suspicious for malignancy. Images were processed with CAD. IMPRESSION: No mammographic evidence of malignancy. A result letter of this screening mammogram  will be mailed directly to the patient. RECOMMENDATION: Screening mammogram in one year. (Code:SM-B-01Y) BI-RADS CATEGORY  1: Negative. Electronically Signed   By: Evangeline Dakin M.D.   On: 04/25/2015 11:07       Assessment & Plan:   Problem List Items Addressed This Visit    Depression    On lexapro.  Seeing a Social worker.  Helping.  Follow.        Relevant Medications   LORazepam (ATIVAN) 0.5 MG tablet   Health care maintenance    Mammogram 04/25/15 - [facility] I.  Physical today.  Wanted to postpone pap secondary to her migraine headache.        Migraine headache    Has been stable.  Has migraine today.  zofran given for nausea.  Follow.        MVP (mitral valve prolapse) - Primary    Just evaluated by cardiology.  Echo with moderate MR.  Currently asymptomatic.  Recommended f/u echo in 2-3 years.        Seizure disorder [facility])    Has been followed by neurology.  On dilantin.  No recent seizure.  Follow.        Stress    Increased stress with her mother's medical issues.  Stable on lexapro.  Does not feel she needs any further intervention.  Follow.            Einar Pheasant, MD

## 2015-06-23 ENCOUNTER — Encounter: Payer: Self-pay | Admitting: Internal Medicine

## 2015-06-23 NOTE — Assessment & Plan Note (Signed)
Just evaluated by cardiology.  Echo with moderate MR.  Currently asymptomatic.  Recommended f/u echo in 2-3 years.

## 2015-06-23 NOTE — Assessment & Plan Note (Addendum)
Mammogram 04/25/15 - Birads I.  Physical today.  Wanted to postpone pap secondary to her migraine headache.

## 2015-06-23 NOTE — Assessment & Plan Note (Signed)
On lexapro.  Seeing a Social worker.  Helping.  Follow.

## 2015-06-23 NOTE — Assessment & Plan Note (Signed)
Has been followed by neurology.  On dilantin.  No recent seizure.  Follow.

## 2015-06-23 NOTE — Assessment & Plan Note (Signed)
Increased stress with her mother's medical issues.  Stable on lexapro.  Does not feel she needs any further intervention.  Follow.

## 2015-06-23 NOTE — Assessment & Plan Note (Signed)
Has been stable.  Has migraine today.  zofran given for nausea.  Follow.

## 2015-06-24 ENCOUNTER — Encounter: Payer: Self-pay | Admitting: *Deleted

## 2015-06-27 DIAGNOSIS — J3089 Other allergic rhinitis: Secondary | ICD-10-CM | POA: Diagnosis not present

## 2015-06-27 DIAGNOSIS — J3081 Allergic rhinitis due to animal (cat) (dog) hair and dander: Secondary | ICD-10-CM | POA: Diagnosis not present

## 2015-06-27 DIAGNOSIS — J301 Allergic rhinitis due to pollen: Secondary | ICD-10-CM | POA: Diagnosis not present

## 2015-06-30 NOTE — Telephone Encounter (Signed)
This encounter was created in error - please disregard.

## 2015-07-01 DIAGNOSIS — H5213 Myopia, bilateral: Secondary | ICD-10-CM | POA: Diagnosis not present

## 2015-07-07 DIAGNOSIS — R05 Cough: Secondary | ICD-10-CM | POA: Diagnosis not present

## 2015-07-07 DIAGNOSIS — J029 Acute pharyngitis, unspecified: Secondary | ICD-10-CM | POA: Diagnosis not present

## 2015-07-15 DIAGNOSIS — J3081 Allergic rhinitis due to animal (cat) (dog) hair and dander: Secondary | ICD-10-CM | POA: Diagnosis not present

## 2015-07-15 DIAGNOSIS — J301 Allergic rhinitis due to pollen: Secondary | ICD-10-CM | POA: Diagnosis not present

## 2015-07-15 DIAGNOSIS — J3089 Other allergic rhinitis: Secondary | ICD-10-CM | POA: Diagnosis not present

## 2015-07-25 DIAGNOSIS — J301 Allergic rhinitis due to pollen: Secondary | ICD-10-CM | POA: Diagnosis not present

## 2015-07-25 DIAGNOSIS — J3081 Allergic rhinitis due to animal (cat) (dog) hair and dander: Secondary | ICD-10-CM | POA: Diagnosis not present

## 2015-07-25 DIAGNOSIS — J3089 Other allergic rhinitis: Secondary | ICD-10-CM | POA: Diagnosis not present

## 2015-08-01 DIAGNOSIS — J3089 Other allergic rhinitis: Secondary | ICD-10-CM | POA: Diagnosis not present

## 2015-08-01 DIAGNOSIS — J301 Allergic rhinitis due to pollen: Secondary | ICD-10-CM | POA: Diagnosis not present

## 2015-08-01 DIAGNOSIS — J3081 Allergic rhinitis due to animal (cat) (dog) hair and dander: Secondary | ICD-10-CM | POA: Diagnosis not present

## 2015-08-15 DIAGNOSIS — J3089 Other allergic rhinitis: Secondary | ICD-10-CM | POA: Diagnosis not present

## 2015-08-15 DIAGNOSIS — J301 Allergic rhinitis due to pollen: Secondary | ICD-10-CM | POA: Diagnosis not present

## 2015-08-29 DIAGNOSIS — J3081 Allergic rhinitis due to animal (cat) (dog) hair and dander: Secondary | ICD-10-CM | POA: Diagnosis not present

## 2015-08-29 DIAGNOSIS — J3089 Other allergic rhinitis: Secondary | ICD-10-CM | POA: Diagnosis not present

## 2015-08-29 DIAGNOSIS — J301 Allergic rhinitis due to pollen: Secondary | ICD-10-CM | POA: Diagnosis not present

## 2015-09-05 DIAGNOSIS — J301 Allergic rhinitis due to pollen: Secondary | ICD-10-CM | POA: Diagnosis not present

## 2015-09-05 DIAGNOSIS — J3089 Other allergic rhinitis: Secondary | ICD-10-CM | POA: Diagnosis not present

## 2015-09-05 DIAGNOSIS — J3081 Allergic rhinitis due to animal (cat) (dog) hair and dander: Secondary | ICD-10-CM | POA: Diagnosis not present

## 2015-09-08 ENCOUNTER — Other Ambulatory Visit: Payer: Self-pay | Admitting: Internal Medicine

## 2015-09-08 NOTE — Telephone Encounter (Signed)
Okay to refill? Last refilled on 05/01/15 for #45 with 2 refills. Last seen in February & next appt on 10/14/15.

## 2015-09-08 NOTE — Telephone Encounter (Signed)
ok'd lexapro #30 with 2 refills.

## 2015-09-18 ENCOUNTER — Ambulatory Visit: Payer: 59 | Admitting: Internal Medicine

## 2015-09-19 DIAGNOSIS — J301 Allergic rhinitis due to pollen: Secondary | ICD-10-CM | POA: Diagnosis not present

## 2015-09-19 DIAGNOSIS — J3089 Other allergic rhinitis: Secondary | ICD-10-CM | POA: Diagnosis not present

## 2015-09-19 DIAGNOSIS — J3081 Allergic rhinitis due to animal (cat) (dog) hair and dander: Secondary | ICD-10-CM | POA: Diagnosis not present

## 2015-09-25 ENCOUNTER — Encounter: Payer: Self-pay | Admitting: Internal Medicine

## 2015-09-25 MED ORDER — LORAZEPAM 0.5 MG PO TABS: ORAL_TABLET | ORAL | Status: DC

## 2015-09-25 NOTE — Telephone Encounter (Signed)
ok'd rx for lorazepam #30 with no refills.  Pt is going to be picking up her mother this pm.  She wants to pick up rx then.  rx signed and placed in box.

## 2015-09-26 DIAGNOSIS — J301 Allergic rhinitis due to pollen: Secondary | ICD-10-CM | POA: Diagnosis not present

## 2015-09-26 DIAGNOSIS — J3081 Allergic rhinitis due to animal (cat) (dog) hair and dander: Secondary | ICD-10-CM | POA: Diagnosis not present

## 2015-09-26 DIAGNOSIS — J3089 Other allergic rhinitis: Secondary | ICD-10-CM | POA: Diagnosis not present

## 2015-10-03 DIAGNOSIS — J3081 Allergic rhinitis due to animal (cat) (dog) hair and dander: Secondary | ICD-10-CM | POA: Diagnosis not present

## 2015-10-03 DIAGNOSIS — J301 Allergic rhinitis due to pollen: Secondary | ICD-10-CM | POA: Diagnosis not present

## 2015-10-03 DIAGNOSIS — J3089 Other allergic rhinitis: Secondary | ICD-10-CM | POA: Diagnosis not present

## 2015-10-10 DIAGNOSIS — J3089 Other allergic rhinitis: Secondary | ICD-10-CM | POA: Diagnosis not present

## 2015-10-10 DIAGNOSIS — J3081 Allergic rhinitis due to animal (cat) (dog) hair and dander: Secondary | ICD-10-CM | POA: Diagnosis not present

## 2015-10-10 DIAGNOSIS — J301 Allergic rhinitis due to pollen: Secondary | ICD-10-CM | POA: Diagnosis not present

## 2015-10-14 ENCOUNTER — Encounter: Payer: Self-pay | Admitting: Internal Medicine

## 2015-10-14 ENCOUNTER — Ambulatory Visit (INDEPENDENT_AMBULATORY_CARE_PROVIDER_SITE_OTHER): Payer: 59 | Admitting: Internal Medicine

## 2015-10-14 VITALS — BP 110/70 | HR 98 | Temp 98.6°F | Wt 147.0 lb

## 2015-10-14 DIAGNOSIS — E538 Deficiency of other specified B group vitamins: Secondary | ICD-10-CM

## 2015-10-14 DIAGNOSIS — G40909 Epilepsy, unspecified, not intractable, without status epilepticus: Secondary | ICD-10-CM

## 2015-10-14 DIAGNOSIS — Z13220 Encounter for screening for lipoid disorders: Secondary | ICD-10-CM

## 2015-10-14 DIAGNOSIS — Z658 Other specified problems related to psychosocial circumstances: Secondary | ICD-10-CM | POA: Diagnosis not present

## 2015-10-14 DIAGNOSIS — F329 Major depressive disorder, single episode, unspecified: Secondary | ICD-10-CM | POA: Diagnosis not present

## 2015-10-14 DIAGNOSIS — F32A Depression, unspecified: Secondary | ICD-10-CM

## 2015-10-14 DIAGNOSIS — F439 Reaction to severe stress, unspecified: Secondary | ICD-10-CM

## 2015-10-14 DIAGNOSIS — G43809 Other migraine, not intractable, without status migrainosus: Secondary | ICD-10-CM

## 2015-10-14 NOTE — Progress Notes (Signed)
Patient ID: Alyssa Bautista, female   DOB: Jun 21, 1970, 45 y.o.   MRN: [id number]   Subjective:    Patient ID: Alyssa Bautista, female    DOB: 12-14-1970, 45 y.o.   MRN: [id number]  HPI  Patient here for a scheduled follow up.  She has been doing some nutritional shakes and vitamins.  States her headaches are better.  Handling stress.  Overall feels better and feels she is doing better.  Tries to stay active.  No cardiac symptoms with increased activity or exertion.  No sob.  No acid reflux.  No abdominal pain or cramping.  Bowels stable.     Past Medical History  Diagnosis Date  . Arthritis   . Depression   . Gestational diabetes   . Environmental allergies   . Migraines   . History of chicken pox   . Seizures ([place])   . MVP (mitral valve prolapse)    Past Surgical History  Procedure Laterality Date  . Abdominal hysterectomy  02/2011    abnormal pap (stage III dysplasia).   ovaries not removed.  [facility]  2008  . Dilation and curettage of uterus  2002, 2005  . Wisdom tooth extraction  2003  . Abscess removal  2007    cervix    Family History  Problem Relation Age of Onset  . Hypercholesterolemia Mother   . Hypercholesterolemia Father   . Diabetes Paternal Grandfather   . Prostate cancer Paternal Grandfather   . Breast cancer Maternal Aunt     50's  . Colon cancer Neg Hx    Social History   Social History  . Marital Status: Single    Spouse Name: N/A  . Number of Children: 2  . Years of Education: N/A   Social History Main Topics  . Smoking status: Never Smoker   . Smokeless tobacco: Never Used  . Alcohol Use: 0.0 oz/week    0 Standard drinks or equivalent per week     Comment: rarely  . Drug Use: No  . Sexual Activity: Not Asked   Other Topics Concern  . None   Social History Narrative    Outpatient Encounter Prescriptions as of 10/14/2015  Medication Sig  . acetaminophen (TYLENOL) 500 MG tablet Take 500 mg by mouth as needed.  Marland Kitchen aspirin 325  MG EC tablet Take 325 mg by mouth every 6 (six) hours as needed for pain.  . cyanocobalamin (,VITAMIN B-12,) 1000 MCG/ML injection Inject 1 mL (1,000 mcg total) into the muscle once. Give 1 shot weekly for 4 weeks and then monthly administration  . escitalopram (LEXAPRO) 10 MG tablet TAKE 1 AND 1/2 TABLETS BY MOUTH DAILY  . escitalopram (LEXAPRO) 10 MG tablet TAKE 1 AND 1/2 TABLETS BY MOUTH DAILY  . levocetirizine (XYZAL) 5 MG tablet Take 5 mg by mouth every evening.  Marland Kitchen LORazepam (ATIVAN) 0.5 MG tablet 1/2 tablet q day prn  . montelukast (SINGULAIR) 10 MG tablet Take 10 mg by mouth at bedtime.  . ondansetron (ZOFRAN) 4 MG tablet Take 1 tablet (4 mg total) by mouth every 8 (eight) hours as needed for nausea or vomiting.  . phenytoin (DILANTIN) 100 MG ER capsule Take 1 capsule (100 mg total) by mouth 3 (three) times daily.  . SYRINGE-NEEDLE, DISP, 3 ML 25G X 1" 3 ML MISC Use syringe/needle for each dose of B-12. Per B-12 orders.   No facility-administered encounter medications on file as of 10/14/2015.    Review of Systems  Constitutional: Negative  for appetite change and unexpected weight change.  HENT: Negative for congestion and sinus pressure.   Respiratory: Negative for cough, chest tightness and shortness of breath.   Cardiovascular: Negative for chest pain, palpitations and leg swelling.  Gastrointestinal: Negative for nausea, vomiting, abdominal pain and diarrhea.  Genitourinary: Negative for dysuria and difficulty urinating.  Musculoskeletal: Negative for back pain and joint swelling.  Skin: Negative for color change and rash.  Neurological: Negative for dizziness.       Headaches better.   Psychiatric/Behavioral: Negative for dysphoric mood and agitation.       Objective:    Physical Exam  Constitutional: She appears well-developed and well-nourished. No distress.  HENT:  Nose: Nose normal.  Mouth/Throat: Oropharynx is clear and moist.  Neck: Neck supple. No thyromegaly  present.  Cardiovascular: Normal rate and regular rhythm.   Pulmonary/Chest: Breath sounds normal. No respiratory distress. She has no wheezes.  Abdominal: Soft. Bowel sounds are normal. There is no tenderness.  Musculoskeletal: She exhibits no edema or tenderness.  Lymphadenopathy:    She has no cervical adenopathy.  Skin: No rash noted. No erythema.  Psychiatric: She has a normal mood and affect. Her behavior is normal.    BP 110/70 mmHg  Pulse 98  Temp(Src) 98.6 F (37 C) (Oral)  Wt 147 lb (66.679 kg)  LMP 02/24/2011 Wt Readings from Last 3 Encounters:  10/14/15 147 lb (66.679 kg)  06/17/15 149 lb (67.586 kg)  06/02/15 147 lb 4 oz (66.792 kg)     Lab Results  Component Value Date   WBC 6.6 10/21/2014   HGB 13.7 10/21/2014   HCT 40.0 10/21/2014   PLT 279.0 10/21/2014   GLUCOSE 86 10/21/2014   CHOL 220* 10/21/2014   TRIG 112.0 10/21/2014   HDL 68.50 10/21/2014   LDLCALC 129* 10/21/2014   ALT 18 10/21/2014   AST 14 10/21/2014   NA 140 10/21/2014   K 4.2 10/21/2014   CL 106 10/21/2014   CREATININE 0.74 10/21/2014   BUN 7 10/21/2014   CO2 28 10/21/2014   TSH 3.07 10/21/2014    Mm Screening Breast Tomo Bilateral  04/25/2015  CLINICAL DATA:  Screening. EXAM: DIGITAL SCREENING BILATERAL MAMMOGRAM WITH 3D TOMO WITH CAD COMPARISON:  Previous exam(s). ACR Breast Density Category c: The breast tissue is heterogeneously dense, which may obscure small masses. FINDINGS: There are no findings suspicious for malignancy. Images were processed with CAD. IMPRESSION: No mammographic evidence of malignancy. A result letter of this screening mammogram will be mailed directly to the patient. RECOMMENDATION: Screening mammogram in one year. (Code:SM-B-01Y) BI-RADS CATEGORY  1: Negative. Electronically Signed   By: Evangeline Dakin M.D.   On: 04/25/2015 11:07       Assessment & Plan:   Problem List Items Addressed This Visit    Depression    On lexapro.  Doing well.  Follow.          Migraine headache    Headaches better.  Follow.       Seizure disorder ([place]) - Primary    Stable on current regimen.  Check labs.        Relevant Orders   CBC with Differential/Platelet   Hepatic function panel   Basic metabolic panel   Dilantin (Phenytoin) level, total   Stress    Overall handling stress relatively well.  On lexapro.  Doing better.  Follow.        Relevant Orders   TSH    Other Visit Diagnoses  B12 deficiency        Relevant Orders    Vitamin B12    Screening cholesterol level        Relevant Orders    Lipid panel        Einar Pheasant, MD

## 2015-10-14 NOTE — Progress Notes (Signed)
Pre visit review using our clinic review tool, if applicable. No additional management support is needed unless otherwise documented below in the visit note. 

## 2015-10-19 ENCOUNTER — Encounter: Payer: Self-pay | Admitting: Internal Medicine

## 2015-10-19 NOTE — Assessment & Plan Note (Signed)
Headaches better.  Follow.

## 2015-10-19 NOTE — Assessment & Plan Note (Signed)
Overall handling stress relatively well.  On lexapro.  Doing better.  Follow.

## 2015-10-19 NOTE — Assessment & Plan Note (Signed)
Stable on current regimen.  Check labs.

## 2015-10-19 NOTE — Assessment & Plan Note (Signed)
On lexapro.  Doing well.  Follow.   

## 2015-10-29 ENCOUNTER — Other Ambulatory Visit (INDEPENDENT_AMBULATORY_CARE_PROVIDER_SITE_OTHER): Payer: 59

## 2015-10-29 DIAGNOSIS — Z658 Other specified problems related to psychosocial circumstances: Secondary | ICD-10-CM

## 2015-10-29 DIAGNOSIS — G40909 Epilepsy, unspecified, not intractable, without status epilepticus: Secondary | ICD-10-CM | POA: Diagnosis not present

## 2015-10-29 DIAGNOSIS — E538 Deficiency of other specified B group vitamins: Secondary | ICD-10-CM

## 2015-10-29 DIAGNOSIS — Z13220 Encounter for screening for lipoid disorders: Secondary | ICD-10-CM

## 2015-10-29 DIAGNOSIS — F439 Reaction to severe stress, unspecified: Secondary | ICD-10-CM

## 2015-10-29 LAB — BASIC METABOLIC PANEL
BUN: 13 mg/dL (ref 6–23)
CALCIUM: 9.3 mg/dL (ref 8.4–10.5)
CO2: 29 mEq/L (ref 19–32)
Chloride: 107 mEq/L (ref 96–112)
Creatinine, Ser: 0.71 mg/dL (ref 0.40–1.20)
GFR: 95.61 mL/min (ref >60.00)
GLUCOSE: 86 mg/dL (ref 70–99)
Potassium: 4.6 mEq/L (ref 3.5–5.1)
Sodium: 141 mEq/L (ref 135–145)

## 2015-10-29 LAB — CBC WITH DIFFERENTIAL/PLATELET
BASOS ABS: 0 10*3/uL (ref 0.0–0.1)
Basophils Relative: 0.4 % (ref 0.0–3.0)
EOS ABS: 0.2 10*3/uL (ref 0.0–0.7)
Eosinophils Relative: 2.9 % (ref 0.0–5.0)
HEMATOCRIT: 40.1 % (ref 36.0–46.0)
Hemoglobin: 13.6 g/dL (ref 12.0–15.0)
LYMPHS PCT: 22.3 % (ref 12.0–46.0)
Lymphs Abs: 1.3 10*3/uL (ref 0.7–4.0)
MCHC: 33.9 g/dL (ref 30.0–36.0)
MCV: 97 fl (ref 78.0–100.0)
Monocytes Absolute: 0.4 10*3/uL (ref 0.1–1.0)
Monocytes Relative: 7.1 % (ref 3.0–12.0)
NEUTROS ABS: 3.9 10*3/uL (ref 1.4–7.7)
NEUTROS PCT: 67.3 % (ref 43.0–77.0)
PLATELETS: 254 10*3/uL (ref 150.0–400.0)
RBC: 4.14 Mil/uL (ref 3.87–5.11)
RDW: 13 % (ref 11.5–15.5)
WBC: 5.9 10*3/uL (ref 4.0–10.5)

## 2015-10-29 LAB — LIPID PANEL
CHOLESTEROL: 177 mg/dL (ref 0–200)
HDL: 59.9 mg/dL (ref >39.00)
LDL CALC: 97 mg/dL (ref 0–99)
NonHDL: 116.93
TRIGLYCERIDES: 101 mg/dL (ref 0.0–149.0)
Total CHOL/HDL Ratio: 3
VLDL: 20.2 mg/dL (ref 0.0–40.0)

## 2015-10-29 LAB — HEPATIC FUNCTION PANEL
ALK PHOS: 80 U/L (ref 39–117)
ALT: 26 U/L (ref 0–35)
AST: 16 U/L (ref 0–37)
Albumin: 4.3 g/dL (ref 3.5–5.2)
BILIRUBIN DIRECT: 0.1 mg/dL (ref 0.0–0.3)
TOTAL PROTEIN: 7.2 g/dL (ref 6.0–8.3)
Total Bilirubin: 0.4 mg/dL (ref 0.2–1.2)

## 2015-10-29 LAB — TSH: TSH: 2.98 u[IU]/mL (ref 0.35–4.50)

## 2015-10-29 LAB — VITAMIN B12: Vitamin B-12: 404 pg/mL (ref 211–911)

## 2015-10-30 ENCOUNTER — Telehealth: Payer: Self-pay | Admitting: Internal Medicine

## 2015-10-30 ENCOUNTER — Encounter: Payer: Self-pay | Admitting: *Deleted

## 2015-10-30 LAB — PHENYTOIN LEVEL, TOTAL: Phenytoin Lvl: 7.9 ug/mL — ABNORMAL LOW (ref 10.0–20.0)

## 2015-10-31 DIAGNOSIS — J3081 Allergic rhinitis due to animal (cat) (dog) hair and dander: Secondary | ICD-10-CM | POA: Diagnosis not present

## 2015-10-31 DIAGNOSIS — J301 Allergic rhinitis due to pollen: Secondary | ICD-10-CM | POA: Diagnosis not present

## 2015-10-31 DIAGNOSIS — J3089 Other allergic rhinitis: Secondary | ICD-10-CM | POA: Diagnosis not present

## 2015-11-04 MED ORDER — LORAZEPAM 0.5 MG PO TABS: ORAL_TABLET | ORAL | Status: DC

## 2015-11-04 NOTE — Telephone Encounter (Signed)
Alyssa Bautista is this script faxed or ready for pickup? thanks

## 2015-11-04 NOTE — Addendum Note (Signed)
Addended by: Alisa Graff on: 11/04/2015 01:36 PM   Modules accepted: Orders

## 2015-11-04 NOTE — Telephone Encounter (Signed)
rx ok'd for lorazepam #30 with no refills.  Let me know if needs anything or has any problems.

## 2015-11-04 NOTE — Telephone Encounter (Signed)
Rx just received & faxed to [facility]

## 2015-11-21 DIAGNOSIS — J3081 Allergic rhinitis due to animal (cat) (dog) hair and dander: Secondary | ICD-10-CM | POA: Diagnosis not present

## 2015-11-21 DIAGNOSIS — J301 Allergic rhinitis due to pollen: Secondary | ICD-10-CM | POA: Diagnosis not present

## 2015-11-21 DIAGNOSIS — J3089 Other allergic rhinitis: Secondary | ICD-10-CM | POA: Diagnosis not present

## 2015-11-24 DIAGNOSIS — J301 Allergic rhinitis due to pollen: Secondary | ICD-10-CM | POA: Diagnosis not present

## 2015-12-05 DIAGNOSIS — J3081 Allergic rhinitis due to animal (cat) (dog) hair and dander: Secondary | ICD-10-CM | POA: Diagnosis not present

## 2015-12-05 DIAGNOSIS — J3089 Other allergic rhinitis: Secondary | ICD-10-CM | POA: Diagnosis not present

## 2015-12-05 DIAGNOSIS — J301 Allergic rhinitis due to pollen: Secondary | ICD-10-CM | POA: Diagnosis not present

## 2015-12-16 ENCOUNTER — Other Ambulatory Visit: Payer: Self-pay | Admitting: Internal Medicine

## 2015-12-26 DIAGNOSIS — J3081 Allergic rhinitis due to animal (cat) (dog) hair and dander: Secondary | ICD-10-CM | POA: Diagnosis not present

## 2015-12-26 DIAGNOSIS — J3089 Other allergic rhinitis: Secondary | ICD-10-CM | POA: Diagnosis not present

## 2015-12-26 DIAGNOSIS — J301 Allergic rhinitis due to pollen: Secondary | ICD-10-CM | POA: Diagnosis not present

## 2015-12-30 DIAGNOSIS — J3081 Allergic rhinitis due to animal (cat) (dog) hair and dander: Secondary | ICD-10-CM | POA: Diagnosis not present

## 2015-12-30 DIAGNOSIS — J3089 Other allergic rhinitis: Secondary | ICD-10-CM | POA: Diagnosis not present

## 2016-01-02 ENCOUNTER — Other Ambulatory Visit: Payer: Self-pay | Admitting: Internal Medicine

## 2016-01-02 DIAGNOSIS — J3081 Allergic rhinitis due to animal (cat) (dog) hair and dander: Secondary | ICD-10-CM | POA: Diagnosis not present

## 2016-01-02 DIAGNOSIS — J301 Allergic rhinitis due to pollen: Secondary | ICD-10-CM | POA: Diagnosis not present

## 2016-01-02 DIAGNOSIS — J3089 Other allergic rhinitis: Secondary | ICD-10-CM | POA: Diagnosis not present

## 2016-01-02 MED ORDER — LORAZEPAM 0.5 MG PO TABS: ORAL_TABLET | ORAL | 0 refills | Status: DC

## 2016-01-02 NOTE — Telephone Encounter (Signed)
Rx has been faxed to [place].

## 2016-01-02 NOTE — Telephone Encounter (Signed)
Last refill sent in 11/04/15. Last seen 10/14/15.

## 2016-01-06 DIAGNOSIS — J3089 Other allergic rhinitis: Secondary | ICD-10-CM | POA: Diagnosis not present

## 2016-01-06 DIAGNOSIS — H1045 Other chronic allergic conjunctivitis: Secondary | ICD-10-CM | POA: Diagnosis not present

## 2016-01-06 DIAGNOSIS — J3081 Allergic rhinitis due to animal (cat) (dog) hair and dander: Secondary | ICD-10-CM | POA: Diagnosis not present

## 2016-01-06 DIAGNOSIS — J301 Allergic rhinitis due to pollen: Secondary | ICD-10-CM | POA: Diagnosis not present

## 2016-01-08 DIAGNOSIS — M7712 Lateral epicondylitis, left elbow: Secondary | ICD-10-CM | POA: Diagnosis not present

## 2016-01-16 ENCOUNTER — Ambulatory Visit: Payer: 59 | Admitting: Internal Medicine

## 2016-01-16 DIAGNOSIS — J3081 Allergic rhinitis due to animal (cat) (dog) hair and dander: Secondary | ICD-10-CM | POA: Diagnosis not present

## 2016-01-16 DIAGNOSIS — J301 Allergic rhinitis due to pollen: Secondary | ICD-10-CM | POA: Diagnosis not present

## 2016-01-16 DIAGNOSIS — Z0289 Encounter for other administrative examinations: Secondary | ICD-10-CM

## 2016-01-16 DIAGNOSIS — J3089 Other allergic rhinitis: Secondary | ICD-10-CM | POA: Diagnosis not present

## 2016-01-23 DIAGNOSIS — J3081 Allergic rhinitis due to animal (cat) (dog) hair and dander: Secondary | ICD-10-CM | POA: Diagnosis not present

## 2016-01-23 DIAGNOSIS — J3089 Other allergic rhinitis: Secondary | ICD-10-CM | POA: Diagnosis not present

## 2016-01-23 DIAGNOSIS — J301 Allergic rhinitis due to pollen: Secondary | ICD-10-CM | POA: Diagnosis not present

## 2016-01-30 DIAGNOSIS — J3081 Allergic rhinitis due to animal (cat) (dog) hair and dander: Secondary | ICD-10-CM | POA: Diagnosis not present

## 2016-01-30 DIAGNOSIS — J301 Allergic rhinitis due to pollen: Secondary | ICD-10-CM | POA: Diagnosis not present

## 2016-01-30 DIAGNOSIS — J3089 Other allergic rhinitis: Secondary | ICD-10-CM | POA: Diagnosis not present

## 2016-02-06 DIAGNOSIS — J3081 Allergic rhinitis due to animal (cat) (dog) hair and dander: Secondary | ICD-10-CM | POA: Diagnosis not present

## 2016-02-06 DIAGNOSIS — J301 Allergic rhinitis due to pollen: Secondary | ICD-10-CM | POA: Diagnosis not present

## 2016-02-06 DIAGNOSIS — J3089 Other allergic rhinitis: Secondary | ICD-10-CM | POA: Diagnosis not present

## 2016-02-13 DIAGNOSIS — J3081 Allergic rhinitis due to animal (cat) (dog) hair and dander: Secondary | ICD-10-CM | POA: Diagnosis not present

## 2016-02-13 DIAGNOSIS — J301 Allergic rhinitis due to pollen: Secondary | ICD-10-CM | POA: Diagnosis not present

## 2016-02-13 DIAGNOSIS — J3089 Other allergic rhinitis: Secondary | ICD-10-CM | POA: Diagnosis not present

## 2016-03-05 DIAGNOSIS — J3081 Allergic rhinitis due to animal (cat) (dog) hair and dander: Secondary | ICD-10-CM | POA: Diagnosis not present

## 2016-03-05 DIAGNOSIS — J3089 Other allergic rhinitis: Secondary | ICD-10-CM | POA: Diagnosis not present

## 2016-03-05 DIAGNOSIS — J301 Allergic rhinitis due to pollen: Secondary | ICD-10-CM | POA: Diagnosis not present

## 2016-03-19 DIAGNOSIS — J301 Allergic rhinitis due to pollen: Secondary | ICD-10-CM | POA: Diagnosis not present

## 2016-03-19 DIAGNOSIS — J3081 Allergic rhinitis due to animal (cat) (dog) hair and dander: Secondary | ICD-10-CM | POA: Diagnosis not present

## 2016-03-19 DIAGNOSIS — J3089 Other allergic rhinitis: Secondary | ICD-10-CM | POA: Diagnosis not present

## 2016-03-30 DIAGNOSIS — D2371 Other benign neoplasm of skin of right lower limb, including hip: Secondary | ICD-10-CM | POA: Diagnosis not present

## 2016-03-30 DIAGNOSIS — D2261 Melanocytic nevi of right upper limb, including shoulder: Secondary | ICD-10-CM | POA: Diagnosis not present

## 2016-03-30 DIAGNOSIS — L718 Other rosacea: Secondary | ICD-10-CM | POA: Diagnosis not present

## 2016-03-30 DIAGNOSIS — D225 Melanocytic nevi of trunk: Secondary | ICD-10-CM | POA: Diagnosis not present

## 2016-04-09 DIAGNOSIS — J3089 Other allergic rhinitis: Secondary | ICD-10-CM | POA: Diagnosis not present

## 2016-04-09 DIAGNOSIS — J301 Allergic rhinitis due to pollen: Secondary | ICD-10-CM | POA: Diagnosis not present

## 2016-04-13 ENCOUNTER — Telehealth: Payer: 59 | Admitting: Nurse Practitioner

## 2016-04-13 ENCOUNTER — Encounter: Payer: Self-pay | Admitting: Internal Medicine

## 2016-04-13 DIAGNOSIS — G8929 Other chronic pain: Secondary | ICD-10-CM

## 2016-04-13 DIAGNOSIS — M545 Low back pain: Secondary | ICD-10-CM

## 2016-04-13 MED ORDER — NAPROXEN 500 MG PO TABS: 500.0000 mg | ORAL_TABLET | Freq: Two times a day (BID) | ORAL | 1 refills | Status: DC

## 2016-04-13 MED ORDER — CYCLOBENZAPRINE HCL 10 MG PO TABS: 10.0000 mg | ORAL_TABLET | Freq: Three times a day (TID) | ORAL | 1 refills | Status: DC | PRN

## 2016-04-13 NOTE — Telephone Encounter (Signed)
It sounds like she needs to be seen.  I can work her in Thursday at 12:15.  Let me know if this is a problem.

## 2016-04-13 NOTE — Progress Notes (Signed)

## 2016-04-14 NOTE — Telephone Encounter (Signed)
Good morning , I have attempted to reach you by phone. Please give the office a call back at [phone].  Thank you.  Kristen CMA.

## 2016-04-14 NOTE — Telephone Encounter (Signed)
Left message to call on cell phone  

## 2016-04-14 NOTE — Telephone Encounter (Signed)
Spoke with patient she was prescribed Flexeril and antiinflammatory .  She is coming for appointment tomorrow.

## 2016-04-15 ENCOUNTER — Encounter: Payer: Self-pay | Admitting: Internal Medicine

## 2016-04-15 ENCOUNTER — Ambulatory Visit (INDEPENDENT_AMBULATORY_CARE_PROVIDER_SITE_OTHER): Payer: 59 | Admitting: Internal Medicine

## 2016-04-15 VITALS — BP 124/82 | HR 96 | Temp 99.1°F | Resp 16 | Wt 146.1 lb

## 2016-04-15 DIAGNOSIS — G43809 Other migraine, not intractable, without status migrainosus: Secondary | ICD-10-CM

## 2016-04-15 DIAGNOSIS — M5416 Radiculopathy, lumbar region: Secondary | ICD-10-CM | POA: Diagnosis not present

## 2016-04-15 MED ORDER — METHYLPREDNISOLONE 4 MG PO TBPK: ORAL_TABLET | ORAL | 0 refills | Status: DC

## 2016-04-15 NOTE — Progress Notes (Signed)
Patient ID: Alyssa Bautista, female   DOB: Jul 25, 1970, 45 y.o.   MRN: [id number]   Subjective:    Patient ID: Alyssa Bautista, female    DOB: [birth year], 45 y.o.   MRN: [id number]  HPI  Patient here as a work in with concerns regarding increased back pain.  She has a history back issues and pain.  Has seen Dr Maryjean Ka previously.  Has had injections.  States that starting 3-4 days ago, she had increased pain in her lower back - R>L.  Persistent increased pain with burning and tingling in the back of her legs.  Also pain down to her foot.  Is taking naprosyn and flexeril - prescribed through an E-visit.  Has also been using heat and ice.  Has a call in to Dr Maryjean Ka office.  Waiting to hear back.  No bowel or bladder incontinence mentioned.  She also has a history of migraines.  Having headache now.  Takes Goody's.  Feels similar to her previous headaches.  No unusual headache.  Eating.  No nausea or vomiting.     Past Medical History:  Diagnosis Date  . Arthritis   . Depression   . Environmental allergies   . Gestational diabetes   . History of chicken pox   . Migraines   . MVP (mitral valve prolapse)   . Seizures ([place])    Past Surgical History:  Procedure Laterality Date  . ABDOMINAL HYSTERECTOMY  02/2011   abnormal pap (stage III dysplasia).   ovaries not removed.  Marland Kitchen abscess removal  2007   cervix   . DILATION AND CURETTAGE OF UTERUS  2002, 2005  . LEEP  2008  . WISDOM TOOTH EXTRACTION  2003   Family History  Problem Relation Age of Onset  . Hypercholesterolemia Mother   . Hypercholesterolemia Father   . Diabetes Paternal Grandfather   . Prostate cancer Paternal Grandfather   . Breast cancer Maternal Aunt     50's  . Colon cancer Neg Hx    Social History   Social History  . Marital status: Single    Spouse name: N/A  . Number of children: 2  . Years of education: N/A   Social History Main Topics  . Smoking status: Never Smoker  . Smokeless tobacco:  Never Used  . Alcohol use 0.0 oz/week     Comment: rarely  . Drug use: No  . Sexual activity: Not Asked   Other Topics Concern  . None   Social History Narrative  . None    Outpatient Encounter Prescriptions as of 04/15/2016  Medication Sig  . acetaminophen (TYLENOL) 500 MG tablet Take 500 mg by mouth as needed.  Marland Kitchen aspirin 325 MG EC tablet Take 325 mg by mouth every 6 (six) hours as needed for pain.  . cyanocobalamin (,VITAMIN B-12,) 1000 MCG/ML injection Inject 1 mL (1,000 mcg total) into the muscle once. Give 1 shot weekly for 4 weeks and then monthly administration  . cyclobenzaprine (FLEXERIL) 10 MG tablet Take 1 tablet (10 mg total) by mouth 3 (three) times daily as needed for muscle spasms.  Marland Kitchen escitalopram (LEXAPRO) 10 MG tablet TAKE 1 AND 1/2 TABLETS BY MOUTH DAILY  . levocetirizine (XYZAL) 5 MG tablet Take 5 mg by mouth every evening.  Marland Kitchen LORazepam (ATIVAN) 0.5 MG tablet 1/2 tablet q day prn  . montelukast (SINGULAIR) 10 MG tablet Take 10 mg by mouth at bedtime.  . naproxen (NAPROSYN) 500 MG tablet Take 1 tablet (500 mg  total) by mouth 2 (two) times daily with a meal.  . ondansetron (ZOFRAN) 4 MG tablet Take 1 tablet (4 mg total) by mouth every 8 (eight) hours as needed for nausea or vomiting.  . phenytoin (DILANTIN) 100 MG ER capsule Take 1 capsule (100 mg total) by mouth 3 (three) times daily.  . SYRINGE-NEEDLE, DISP, 3 ML 25G X 1" 3 ML MISC Use syringe/needle for each dose of B-12. Per B-12 orders.  Marland Kitchen escitalopram (LEXAPRO) 10 MG tablet TAKE 1 AND 1/2 TABLETS BY MOUTH DAILY (Patient not taking: Reported on 04/15/2016)  . methylPREDNISolone (MEDROL DOSEPAK) 4 MG TBPK tablet Medrol dose pack - 6 day taper   No facility-administered encounter medications on file as of 04/15/2016.     Review of Systems  Constitutional: Negative for appetite change and unexpected weight change.  HENT: Negative for congestion and sinus pressure.   Respiratory: Negative for cough, chest tightness  and shortness of breath.   Cardiovascular: Negative for chest pain, palpitations and leg swelling.  Gastrointestinal: Negative for abdominal pain, diarrhea, nausea and vomiting.  Genitourinary: Negative for difficulty urinating and dysuria.  Musculoskeletal: Positive for back pain. Negative for joint swelling.  Skin: Negative for color change and rash.  Neurological: Positive for headaches. Negative for dizziness.  Psychiatric/Behavioral: Negative for agitation and dysphoric mood.       Objective:    Physical Exam  Constitutional: She appears well-developed and well-nourished. No distress.  HENT:  Nose: Nose normal.  Mouth/Throat: Oropharynx is clear and moist.  Neck: Neck supple. No thyromegaly present.  Cardiovascular: Normal rate and regular rhythm.   Pulmonary/Chest: Breath sounds normal. No respiratory distress. She has no wheezes.  Abdominal: Soft. Bowel sounds are normal. There is no tenderness.  Musculoskeletal: She exhibits no edema or tenderness.  Lymphadenopathy:    She has no cervical adenopathy.    BP 124/82 (BP Location: Left Arm, Patient Position: Sitting, Cuff Size: Normal)   Pulse 96   Temp 99.1 F (37.3 C)   Resp 16   Wt 146 lb 2 oz (66.3 kg)   LMP 02/24/2011   SpO2 98%   BMI 23.59 kg/m  Wt Readings from Last 3 Encounters:  04/15/16 146 lb 2 oz (66.3 kg)  10/14/15 147 lb (66.7 kg)  06/17/15 149 lb (67.6 kg)     Lab Results  Component Value Date   WBC 5.9 10/29/2015   HGB 13.6 10/29/2015   HCT 40.1 10/29/2015   PLT 254.0 10/29/2015   GLUCOSE 86 10/29/2015   CHOL 177 10/29/2015   TRIG 101.0 10/29/2015   HDL 59.90 10/29/2015   LDLCALC 97 10/29/2015   ALT 26 10/29/2015   AST 16 10/29/2015   NA 141 10/29/2015   K 4.6 10/29/2015   CL 107 10/29/2015   CREATININE 0.71 10/29/2015   BUN 13 10/29/2015   CO2 29 10/29/2015   TSH 2.98 10/29/2015    Mm Screening Breast Tomo Bilateral  Result Date: 04/25/2015 CLINICAL DATA:  Screening. EXAM:  DIGITAL SCREENING BILATERAL MAMMOGRAM WITH 3D TOMO WITH CAD COMPARISON:  Previous exam(s). ACR Breast Density Category c: The breast tissue is heterogeneously dense, which may obscure small masses. FINDINGS: There are no findings suspicious for malignancy. Images were processed with CAD. IMPRESSION: No mammographic evidence of malignancy. A result letter of this screening mammogram will be mailed directly to the patient. RECOMMENDATION: Screening mammogram in one year. (Code:SM-B-01Y) BI-RADS CATEGORY  1: Negative. Electronically Signed   By: Evangeline Dakin M.D.   On: 04/25/2015  11:07       Assessment & Plan:   Problem List Items Addressed This Visit    Lumbar radicular pain - Primary    With increased back and leg pain as outlined.  Discussed with her today.  Treat with medrol dose pack as directed.  Has flexeril.  Has call in to Dr Maryjean Ka.        Relevant Orders   MR Lumbar Spine Wo Contrast   Migraine headache    Headache as outlined.  Typical flare.  Has her current regimen she takes.  Follow closely.  If increase in frequency, may have to try medication to prevent headaches from occurring.            Einar Pheasant, MD

## 2016-04-15 NOTE — Progress Notes (Signed)
Pre visit review using our clinic review tool, if applicable. No additional management support is needed unless otherwise documented below in the visit note. 

## 2016-04-16 DIAGNOSIS — J3089 Other allergic rhinitis: Secondary | ICD-10-CM | POA: Diagnosis not present

## 2016-04-16 DIAGNOSIS — J301 Allergic rhinitis due to pollen: Secondary | ICD-10-CM | POA: Diagnosis not present

## 2016-04-16 DIAGNOSIS — J3081 Allergic rhinitis due to animal (cat) (dog) hair and dander: Secondary | ICD-10-CM | POA: Diagnosis not present

## 2016-04-18 ENCOUNTER — Encounter: Payer: Self-pay | Admitting: Internal Medicine

## 2016-04-18 NOTE — Assessment & Plan Note (Signed)
With increased back and leg pain as outlined.  Discussed with her today.  Treat with medrol dose pack as directed.  Has flexeril.  Has call in to Dr Maryjean Ka.

## 2016-04-18 NOTE — Assessment & Plan Note (Signed)
Headache as outlined.  Typical flare.  Has her current regimen she takes.  Follow closely.  If increase in frequency, may have to try medication to prevent headaches from occurring.

## 2016-04-19 ENCOUNTER — Other Ambulatory Visit: Payer: Self-pay | Admitting: Internal Medicine

## 2016-04-19 NOTE — Telephone Encounter (Signed)
Last refill 12/16/15 #45 +2, last OV 04/15/16. Ok to refill?

## 2016-04-20 ENCOUNTER — Encounter: Payer: Self-pay | Admitting: Internal Medicine

## 2016-04-20 ENCOUNTER — Other Ambulatory Visit: Payer: Self-pay | Admitting: Internal Medicine

## 2016-04-20 MED ORDER — LORAZEPAM 0.5 MG PO TABS: ORAL_TABLET | ORAL | 0 refills | Status: DC

## 2016-04-20 NOTE — Telephone Encounter (Signed)
ok'd rx for lorazepam #30 with no refills.   

## 2016-04-20 NOTE — Telephone Encounter (Signed)
rx ok'd for lexapro #45 with 2 refills.   

## 2016-04-23 DIAGNOSIS — J3081 Allergic rhinitis due to animal (cat) (dog) hair and dander: Secondary | ICD-10-CM | POA: Diagnosis not present

## 2016-04-23 DIAGNOSIS — J301 Allergic rhinitis due to pollen: Secondary | ICD-10-CM | POA: Diagnosis not present

## 2016-04-23 DIAGNOSIS — J3089 Other allergic rhinitis: Secondary | ICD-10-CM | POA: Diagnosis not present

## 2016-04-27 ENCOUNTER — Other Ambulatory Visit: Payer: Self-pay | Admitting: Neurology

## 2016-04-27 DIAGNOSIS — G40209 Localization-related (focal) (partial) symptomatic epilepsy and epileptic syndromes with complex partial seizures, not intractable, without status epilepticus: Secondary | ICD-10-CM | POA: Diagnosis not present

## 2016-04-27 DIAGNOSIS — Z79899 Other long term (current) drug therapy: Secondary | ICD-10-CM | POA: Diagnosis not present

## 2016-04-30 ENCOUNTER — Ambulatory Visit
Admission: RE | Admit: 2016-04-30 | Discharge: 2016-04-30 | Disposition: A | Discharge status: Home / Self Care | Payer: 59 | Source: Ambulatory Visit | Attending: Internal Medicine | Admitting: Internal Medicine

## 2016-04-30 DIAGNOSIS — M545 Low back pain: Secondary | ICD-10-CM | POA: Diagnosis not present

## 2016-04-30 DIAGNOSIS — M5116 Intervertebral disc disorders with radiculopathy, lumbar region: Secondary | ICD-10-CM | POA: Diagnosis not present

## 2016-04-30 DIAGNOSIS — M5416 Radiculopathy, lumbar region: Secondary | ICD-10-CM | POA: Diagnosis present

## 2016-04-30 DIAGNOSIS — M4807 Spinal stenosis, lumbosacral region: Secondary | ICD-10-CM | POA: Insufficient documentation

## 2016-04-30 DIAGNOSIS — M5127 Other intervertebral disc displacement, lumbosacral region: Secondary | ICD-10-CM | POA: Insufficient documentation

## 2016-05-04 ENCOUNTER — Encounter: Payer: Self-pay | Admitting: Internal Medicine

## 2016-05-06 MED ORDER — METHYLPREDNISOLONE 4 MG PO TBPK: ORAL_TABLET | ORAL | 0 refills | Status: DC

## 2016-05-06 NOTE — Telephone Encounter (Signed)
rx sent in for medrol dose pack.  Pt notified via my chart.

## 2016-05-10 ENCOUNTER — Encounter: Payer: Self-pay | Admitting: Internal Medicine

## 2016-05-11 ENCOUNTER — Ambulatory Visit (INDEPENDENT_AMBULATORY_CARE_PROVIDER_SITE_OTHER): Payer: 59 | Admitting: Family

## 2016-05-11 ENCOUNTER — Encounter: Payer: Self-pay | Admitting: Family

## 2016-05-11 VITALS — BP 120/74 | HR 103 | Temp 99.0°F | Wt 148.2 lb

## 2016-05-11 DIAGNOSIS — M5416 Radiculopathy, lumbar region: Secondary | ICD-10-CM

## 2016-05-11 MED ORDER — HYDROCODONE-ACETAMINOPHEN 5-325 MG PO TABS: 1.0000 | ORAL_TABLET | Freq: Four times a day (QID) | ORAL | 0 refills | Status: DC | PRN

## 2016-05-11 NOTE — Patient Instructions (Signed)
Alternate between norco ( with tyelonol) and tyleonol for pain relief.   Let me know if no improvement  Good luck with appointment next week

## 2016-05-11 NOTE — Assessment & Plan Note (Addendum)
Worsening. Symptoms support recent MRI lumbar findings of L4-l5, L5-S1. No gross neurologic deficits today. Patient and I discussed that our goal today was to provide acute pain relief until she can be seen by Dr Maryjean Ka next week. Provided work note through next week and gave 5 day supply norco for PRN. Advised to stop flexeril and to complete prednisone. Assessed [place] Controlled Substance Reporting System and did not see suspicious activity at this time under patient's name and address in Epic. Return precautions given.

## 2016-05-11 NOTE — Progress Notes (Signed)
Subjective:    Patient ID: Alyssa Bautista, female    DOB: 02-Apr-1971, 46 y.o.   MRN: [id number]  CC: Alyssa Bautista is a 46 y.o. female who presents today for an acute visit.    HPI: CC: chronic back pain for 4-5 years. Here today because 'back went out on me yesterday.' Has seen neurosurgery and had epidural injections to 'stay away from surgery' , last one one year ago. 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 Numbness and tingling in both legs. No urinary incontinence, saddle anesthesia.   Seen 3 weeks ago by her PCP when 'back went out', treated with prednisone Dosepak and Flexeril. Has since called in another dose pak for prednisone and had 4 days left, no improvement. Flexeril hasn't helped.  Has an appt with Dr. Maryjean Bautista and scheduled for epidural injection next week. Took percocet with some relief.    Mammography tech; also cares for patient with dementia   04/2016 MRI L4-L5 disc desiccation with narrowing, no impingement L5-S1 progressive disc desiccation  HISTORY:  Past Medical History:  Diagnosis Date  . Arthritis   . Depression   . Environmental allergies   . Gestational diabetes   . History of chicken pox   . Migraines   . MVP (mitral valve prolapse)   . Seizures ([place])    Past Surgical History:  Procedure Laterality Date  . ABDOMINAL HYSTERECTOMY  02/2011   abnormal pap (stage III dysplasia).   ovaries not removed.  Marland Kitchen abscess removal  2007   cervix   . DILATION AND CURETTAGE OF UTERUS  2002, 2005  . LEEP  2008  . WISDOM TOOTH EXTRACTION  2003   Family History  Problem Relation Age of Onset  . Hypercholesterolemia Mother   . Hypercholesterolemia Father   . Diabetes Paternal Grandfather   . Prostate cancer Paternal Grandfather   . Breast cancer Maternal Aunt     50's  . Colon cancer Neg Hx     Allergies: Penicillins; Azithromycin; Dilantin [phenytoin]; and  Clindamycin/lincomycin Current Outpatient Prescriptions on File Prior to Visit  Medication Sig Dispense Refill  . acetaminophen (TYLENOL) 500 MG tablet Take 500 mg by mouth as needed.    Marland Kitchen aspirin 325 MG EC tablet Take 325 mg by mouth every 6 (six) hours as needed for pain.    . cyanocobalamin (,VITAMIN B-12,) 1000 MCG/ML injection Inject 1 mL (1,000 mcg total) into the muscle once. Give 1 shot weekly for 4 weeks and then monthly administration 1 mL 15  . cyclobenzaprine (FLEXERIL) 10 MG tablet Take 1 tablet (10 mg total) by mouth 3 (three) times daily as needed for muscle spasms. 30 tablet 1  . escitalopram (LEXAPRO) 10 MG tablet TAKE 1 AND 1/2 TABLETS BY MOUTH DAILY 45 tablet 2  . levocetirizine (XYZAL) 5 MG tablet Take 5 mg by mouth every evening.    Marland Kitchen LORazepam (ATIVAN) 0.5 MG tablet 1/2 tablet q day prn 30 tablet 0  . methylPREDNISolone (MEDROL DOSEPAK) 4 MG TBPK tablet Medrol dose pack - 6 day taper 21 tablet 0  . montelukast (SINGULAIR) 10 MG tablet Take 10 mg by mouth at bedtime.    . naproxen (NAPROSYN) 500 MG tablet Take 1 tablet (500 mg total) by mouth 2 (two) times daily with a meal. 60 tablet 1  . ondansetron (ZOFRAN) 4 MG tablet Take 1 tablet (4 mg total) by mouth every 8 (eight) hours as needed for  nausea or vomiting. 12 tablet 0  . phenytoin (DILANTIN) 100 MG ER capsule Take 1 capsule (100 mg total) by mouth 3 (three) times daily. 90 capsule 0  . SYRINGE-NEEDLE, DISP, 3 ML 25G X 1" 3 ML MISC Use syringe/needle for each dose of B-12. Per B-12 orders. 20 each 0   No current facility-administered medications on file prior to visit.     Social History  Substance Use Topics  . Smoking status: Never Smoker  . Smokeless tobacco: Never Used  . Alcohol use 0.0 oz/week     Comment: rarely    Review of Systems  Constitutional: Negative for chills and fever.  Respiratory: Negative for cough.   Cardiovascular: Negative for chest pain and palpitations.  Gastrointestinal: Negative  for nausea and vomiting.  Neurological: Positive for weakness and numbness. Negative for dizziness and headaches.      Objective:    BP 120/74   Pulse (!) 103   Temp 99 F (37.2 C) (Oral)   Wt 148 lb 3.2 oz (67.2 kg)   LMP 02/24/2011   SpO2 99%   BMI 23.92 kg/m    Physical Exam  Constitutional: She appears well-developed and well-nourished.  Grimacing in chair. Leaning to right side due to pain.   Eyes: Conjunctivae are normal.  Cardiovascular: Normal rate, regular rhythm, normal heart sounds and normal pulses.   Pulmonary/Chest: Effort normal and breath sounds normal. She has no wheezes. She has no rhonchi. She has no rales.  Musculoskeletal:       Lumbar back: She exhibits normal range of motion, no tenderness, no bony tenderness, no swelling, no edema, no pain and no spasm.       Back:  Reduction in ROM with flexion, tension, lateral side bends due to pain. No bony tenderness. Pain noted as marked on diagram.   No pain, numbness, tingling elicited with single leg raise bilaterally.   Neurological: She is alert. She has normal strength. No sensory deficit.  Reflex Scores:      Patellar reflexes are 2+ on the right side and 2+ on the left side. Sensation intact bilateral lower extremities. Strength RLE 5/5, LLE 4/5.   Skin: Skin is warm and dry.  Psychiatric: She has a normal mood and affect. Her speech is normal and behavior is normal. Thought content normal.  Vitals reviewed.      Assessment & Plan:   Problem List Items Addressed This Visit      Other   Lumbar radicular pain - Primary    Symptoms support recent MRI lumbar findings of L4-l5, L5-S1. No gross neurologic deficits today. Patient and I discussed that our goal today was to provide acute pain relief until she can be seen by Dr Alyssa Bautista next week. Provided work note through next week and gave 5 day supply norco for PRN. Assessed [place] Controlled Substance Reporting System and did not see suspicious activity at  this time under patient's name and address in Epic. Return precautions given.        Relevant Medications   HYDROcodone-acetaminophen (NORCO) 5-325 MG tablet         I am having Alyssa Bautista start on HYDROcodone-acetaminophen. I am also having her maintain her aspirin, acetaminophen, montelukast, levocetirizine, cyanocobalamin, SYRINGE-NEEDLE (DISP) 3 ML, phenytoin, escitalopram, ondansetron, naproxen, cyclobenzaprine, LORazepam, and methylPREDNISolone.   Meds ordered this encounter  Medications  . HYDROcodone-acetaminophen (NORCO) 5-325 MG tablet    Sig: Take 1 tablet by mouth every 6 (six) hours as needed for moderate pain.  Dispense:  24 tablet    Refill:  0    Order Specific Question:   Supervising Provider    Answer:   Crecencio Mc [[id number]]    Return precautions given.   Risks, benefits, and alternatives of the medications and treatment plan prescribed today were discussed, and patient expressed understanding.   Education regarding symptom management and diagnosis given to patient on AVS.  Continue to follow with Einar Pheasant, MD for routine health maintenance.   Alyssa Bautista and I agreed with plan.   Mable Paris, FNP

## 2016-05-13 ENCOUNTER — Encounter: Payer: Self-pay | Admitting: Internal Medicine

## 2016-05-18 ENCOUNTER — Encounter: Payer: Self-pay | Admitting: Internal Medicine

## 2016-05-18 ENCOUNTER — Other Ambulatory Visit: Payer: Self-pay | Admitting: Neurological Surgery

## 2016-05-18 DIAGNOSIS — M5416 Radiculopathy, lumbar region: Secondary | ICD-10-CM

## 2016-05-18 DIAGNOSIS — M5127 Other intervertebral disc displacement, lumbosacral region: Secondary | ICD-10-CM | POA: Diagnosis not present

## 2016-05-20 ENCOUNTER — Other Ambulatory Visit: Payer: Self-pay | Admitting: Family

## 2016-05-25 ENCOUNTER — Encounter: Payer: Self-pay | Admitting: Internal Medicine

## 2016-05-26 ENCOUNTER — Ambulatory Visit
Admission: RE | Admit: 2016-05-26 | Discharge: 2016-05-26 | Disposition: A | Discharge status: Home / Self Care | Payer: 59 | Source: Ambulatory Visit | Attending: Neurological Surgery | Admitting: Neurological Surgery

## 2016-05-26 ENCOUNTER — Other Ambulatory Visit: Payer: 59

## 2016-05-26 DIAGNOSIS — M5126 Other intervertebral disc displacement, lumbar region: Secondary | ICD-10-CM | POA: Diagnosis not present

## 2016-05-26 DIAGNOSIS — M5416 Radiculopathy, lumbar region: Secondary | ICD-10-CM

## 2016-05-26 DIAGNOSIS — M545 Low back pain: Secondary | ICD-10-CM | POA: Diagnosis not present

## 2016-05-26 HISTORY — DX: Anxiety disorder, unspecified: F41.9

## 2016-05-26 MED ORDER — ACETAMINOPHEN 500 MG PO TABS
1000.0000 mg | ORAL_TABLET | ORAL | Status: DC | PRN
  Administered 2016-05-26: 1000 mg via ORAL
  Filled 2016-05-26 (×2): qty 2

## 2016-05-26 MED ORDER — ACETAMINOPHEN 325 MG PO TABS: 650.0000 mg | ORAL_TABLET | ORAL | Status: DC | PRN

## 2016-05-26 MED ORDER — IOPAMIDOL (ISOVUE-M 200) INJECTION 41%
10.0000 mL | Freq: Once | INTRAMUSCULAR | Status: AC
  Administered 2016-05-26: 10 mL via EPIDURAL

## 2016-05-26 NOTE — Discharge Instructions (Signed)
Myelogram, Care After Introduction Refer to this sheet in the next few weeks. These instructions provide you with information about caring for yourself after your procedure. Your health care provider may also give you more specific instructions. Your treatment has been planned according to current medical practices, but problems sometimes occur. Call your health care provider if you have any problems or questions after your procedure. What can I expect after the procedure? After the procedure, it is common to have:  Soreness at your injection site.  A mild headache. Follow these instructions at home:  Drink enough fluid to keep your urine clear or pale yellow. This will help flush out the dye (contrast material) from your spine.  Rest as told by your health care provider. Lie flat with your head slightly raised (elevated) to reduce the risk of headache.  Do not bend, lift, or do any strenuous activity for 24-48 hours or as told by your health care provider.  Take over-the-counter and prescription medicines only as told by your health care provider.  Take care of and remove your bandage (dressing) as told by your health care provider.  Bathe or shower as told by your health care provider. Contact a health care provider if:  You have a fever.  You have a headache that lasts longer than 24 hours.  You feel nauseous or vomit.  You have a stiff neck or numbness in your legs.  You are unable to urinate or have a bowel movement.  You develop a rash, itching, or sneezing. Get help right away if:  You have new symptoms or your symptoms get worse.  You have a seizure.  You have trouble breathing. This information is not intended to replace advice given to you by your health care provider. Make sure you discuss any questions you have with your health care provider. Document Released: 05/23/2015 Document Revised: 10/02/2015 Document Reviewed: 02/06/2015  2017 Elsevier

## 2016-05-28 DIAGNOSIS — M5416 Radiculopathy, lumbar region: Secondary | ICD-10-CM | POA: Diagnosis not present

## 2016-05-28 DIAGNOSIS — J301 Allergic rhinitis due to pollen: Secondary | ICD-10-CM | POA: Diagnosis not present

## 2016-05-28 DIAGNOSIS — J3089 Other allergic rhinitis: Secondary | ICD-10-CM | POA: Diagnosis not present

## 2016-05-28 DIAGNOSIS — J3081 Allergic rhinitis due to animal (cat) (dog) hair and dander: Secondary | ICD-10-CM | POA: Diagnosis not present

## 2016-06-01 ENCOUNTER — Other Ambulatory Visit: Payer: 59

## 2016-06-01 ENCOUNTER — Telehealth: Payer: Self-pay | Admitting: Internal Medicine

## 2016-06-01 NOTE — Telephone Encounter (Signed)
Pt led and stated that she saw M. Arnett on 05/11/16 and has since filled out FMLA papers for her. When M. Arnett filled the paperwork out she marked the papers as continuous, but she needs to have it corrected for it to be intermittent. They have faxed over the paperwork. Please advise, thank you!  Call pt @ [phone] or [phone]

## 2016-06-02 NOTE — Telephone Encounter (Signed)
FYI when it comes to your box.

## 2016-06-02 NOTE — Telephone Encounter (Signed)
Will you double check- think I already did change and mark at as intermittant

## 2016-06-02 NOTE — Telephone Encounter (Signed)
Paperwork has been faxed back

## 2016-06-04 ENCOUNTER — Encounter: Payer: Self-pay | Admitting: Cardiovascular Disease

## 2016-06-04 ENCOUNTER — Ambulatory Visit (INDEPENDENT_AMBULATORY_CARE_PROVIDER_SITE_OTHER): Payer: 59 | Admitting: Cardiovascular Disease

## 2016-06-04 VITALS — BP 134/74 | HR 91 | Ht 66.0 in | Wt 148.5 lb

## 2016-06-04 DIAGNOSIS — I341 Nonrheumatic mitral (valve) prolapse: Secondary | ICD-10-CM | POA: Diagnosis not present

## 2016-06-04 NOTE — Progress Notes (Signed)
Cardiology Office Note   Date:  06/04/2016   ID:  Alyssa Bautista, DOB Nov 15, 1970, MRN [id number]  PCP:  Einar Pheasant, MD  Cardiologist:   Kathlyn Sacramento, MD   Chief Complaint  Patient presents with  . other    1 yr f/u c/o back pain and migraine. Meds reviewed verbally with pt.      History of Present Illness: Alyssa Bautista is a 46 y.o. female who presents for a follow-up visit regarding mitral valve prolapse with moderate mitral regurgitation. She has chronic medical conditions that include migraine headaches, depression and abnormal pap smear requiring hysterectomy.  She was told about a cardiac murmur and she was 46 years old.   echocardiogram in January 2016 showed normal LV systolic function, mild to moderate anterior leaflet prolapse with moderate mitral regurgitation, mildly dilated left atrium with normal pulmonary pressure.   she has been doing very well and denies any chest pain, shortness of breath or palpitations.  She is experiencing lower back pain. MRI showed some disc herniation but conservative management was recommended.   Past Medical History:  Diagnosis Date  . Anxiety   . Arthritis   . Depression   . Environmental allergies   . Gestational diabetes   . History of chicken pox   . Migraines   . MVP (mitral valve prolapse)   . Seizures ([employer])     Past Surgical History:  Procedure Laterality Date  . ABDOMINAL HYSTERECTOMY  02/2011   abnormal pap (stage III dysplasia).   ovaries not removed.  Marland Kitchen abscess removal  2007   cervix   . DILATION AND CURETTAGE OF UTERUS  2002, 2005  . LEEP  2008  . WISDOM TOOTH EXTRACTION  2003     Current Outpatient Prescriptions  Medication Sig Dispense Refill  . acetaminophen (TYLENOL) 500 MG tablet Take 500 mg by mouth as needed.    Marland Kitchen aspirin 325 MG EC tablet Take 325 mg by mouth every 6 (six) hours as needed for pain.    Marland Kitchen escitalopram (LEXAPRO) 10 MG tablet TAKE 1 AND 1/2 TABLETS BY MOUTH DAILY  45 tablet 2  . HYDROcodone-acetaminophen (NORCO) 5-325 MG tablet Take 1 tablet by mouth every 6 (six) hours as needed for moderate pain. 24 tablet 0  . levocetirizine (XYZAL) 5 MG tablet Take 5 mg by mouth every evening.    Marland Kitchen LORazepam (ATIVAN) 0.5 MG tablet 1/2 tablet q day prn 30 tablet 0  . montelukast (SINGULAIR) 10 MG tablet Take 10 mg by mouth at bedtime.    . phenytoin (DILANTIN) 100 MG ER capsule Take 1 capsule (100 mg total) by mouth 3 (three) times daily. 90 capsule 0  . SUMAtriptan (IMITREX) 100 MG tablet Take 100 mg by mouth daily as needed for migraine. May repeat in 2 hours if headache persists or recurs.    Marland Kitchen SYRINGE-NEEDLE, DISP, 3 ML 25G X 1" 3 ML MISC Use syringe/needle for each dose of B-12. Per B-12 orders. 20 each 0   No current facility-administered medications for this visit.     Allergies:   Penicillins; Azithromycin; Dilantin [phenytoin]; and Clindamycin/lincomycin    Social History:  The patient  reports that she has never smoked. She has never used smokeless tobacco. She reports that she does not drink alcohol or use drugs.   Family History:  The patient's family history includes Breast cancer in her maternal aunt; Diabetes in her paternal grandfather; Hypercholesterolemia in her father and mother; Prostate cancer in  her paternal grandfather.    ROS:  Please see the history of present illness.   Otherwise, review of systems are positive for none.   All other systems are reviewed and negative.    PHYSICAL EXAM: VS:  BP 134/74 (BP Location: Left Arm, Patient Position: Sitting, Cuff Size: Normal)   Pulse 91   Ht 5\' 6"  (1.676 m)   Wt 148 lb 8 oz (67.4 kg)   LMP 02/24/2011   BMI 23.97 kg/m  , BMI Body mass index is 23.97 kg/m. GEN: Well nourished, well developed, in no acute distress  HEENT: normal  Neck: no JVD, carotid bruits, or masses Cardiac: RRR; no  rubs, or gallops,no edema . 3/6 holosystolic murmur at the apex radiating to the left sternal  border. Respiratory:  clear to auscultation bilaterally, normal work of breathing GI: soft, nontender, nondistended, + BS MS: no deformity or atrophy  Skin: warm and dry, no rash Neuro:  Strength and sensation are intact Psych: euthymic mood, full affect   EKG:  EKG is ordered today. The ekg ordered today demonstrates  normal sinus rhythm with nonspecific T wave changes.   Recent Labs: 10/29/2015: ALT 26; BUN 13; Creatinine, Ser 0.71; Hemoglobin 13.6; Platelets 254.0; Potassium 4.6; Sodium 141; TSH 2.98    Lipid Panel    Component Value Date/Time   CHOL 177 10/29/2015 0802   TRIG 101.0 10/29/2015 0802   HDL 59.90 10/29/2015 0802   CHOLHDL 3 10/29/2015 0802   VLDL 20.2 10/29/2015 0802   LDLCALC 97 10/29/2015 0802      Wt Readings from Last 3 Encounters:  06/04/16 148 lb 8 oz (67.4 kg)  05/26/16 148 lb (67.1 kg)  05/11/16 148 lb 3.2 oz (67.2 kg)       No flowsheet data found.    ASSESSMENT AND PLAN:  1.  Mitral valve prolapse with moderate mitral regurgitation: She continues to be asymptomatic. Most recent echocardiogram was 2 years ago and the murmur appears to be more prominent. Thus, I requested a follow-up echocardiogram.    Disposition:   FU with me in 1 year  Signed,  Kathlyn Sacramento, MD  06/04/2016 2:51 PM    Alyssa Bautista

## 2016-06-04 NOTE — Patient Instructions (Addendum)
Medication Instructions:  Your physician recommends that you continue on your current medications as directed. Please refer to the Current Medication list given to you today.   Labwork: none  Testing/Procedures: Your physician has requested that you have an echocardiogram. Echocardiography is a painless test that uses sound waves to create images of your heart. It provides your doctor with information about the size and shape of your heart and how well your heart's chambers and valves are working. This procedure takes approximately one hour. There are no restrictions for this procedure.    Follow-Up: Your physician wants you to follow-up in: one year with Dr. Arida.  You will receive a reminder letter in the mail two months in advance. If you don't receive a letter, please call our office to schedule the follow-up appointment.   Any Other Special Instructions Will Be Listed Below (If Applicable).     If you need a refill on your cardiac medications before your next appointment, please call your pharmacy.  Echocardiogram An echocardiogram, or echocardiography, uses sound waves (ultrasound) to produce an image of your heart. The echocardiogram is simple, painless, obtained within a short period of time, and offers valuable information to your health care provider. The images from an echocardiogram can provide information such as:  Evidence of coronary artery disease (CAD).  Heart size.  Heart muscle function.  Heart valve function.  Aneurysm detection.  Evidence of a past heart attack.  Fluid buildup around the heart.  Heart muscle thickening.  Assess heart valve function. Tell a health care provider about:  Any allergies you have.  All medicines you are taking, including vitamins, herbs, eye drops, creams, and over-the-counter medicines.  Any problems you or family members have had with anesthetic medicines.  Any blood disorders you have.  Any surgeries you have  had.  Any medical conditions you have.  Whether you are pregnant or may be pregnant. What happens before the procedure? No special preparation is needed. Eat and drink normally. What happens during the procedure?  In order to produce an image of your heart, gel will be applied to your chest and a wand-like tool (transducer) will be moved over your chest. The gel will help transmit the sound waves from the transducer. The sound waves will harmlessly bounce off your heart to allow the heart images to be captured in real-time motion. These images will then be recorded.  You may need an IV to receive a medicine that improves the quality of the pictures. What happens after the procedure? You may return to your normal schedule including diet, activities, and medicines, unless your health care provider tells you otherwise. This information is not intended to replace advice given to you by your health care provider. Make sure you discuss any questions you have with your health care provider. Document Released: 04/23/2000 Document Revised: 12/13/2015 Document Reviewed: 01/01/2013 Elsevier Interactive Patient Education  2017 Elsevier Inc.  

## 2016-06-10 DIAGNOSIS — M5416 Radiculopathy, lumbar region: Secondary | ICD-10-CM | POA: Diagnosis not present

## 2016-06-18 ENCOUNTER — Ambulatory Visit (INDEPENDENT_AMBULATORY_CARE_PROVIDER_SITE_OTHER): Payer: 59 | Admitting: Internal Medicine

## 2016-06-18 ENCOUNTER — Other Ambulatory Visit (HOSPITAL_COMMUNITY)
Admission: RE | Admit: 2016-06-18 | Discharge: 2016-06-18 | Disposition: A | Discharge status: Home / Self Care | Payer: 59 | Source: Ambulatory Visit | Attending: Internal Medicine | Admitting: Internal Medicine

## 2016-06-18 ENCOUNTER — Encounter: Payer: Self-pay | Admitting: Internal Medicine

## 2016-06-18 VITALS — BP 124/78 | HR 79 | Temp 98.6°F | Ht 66.0 in | Wt 148.0 lb

## 2016-06-18 DIAGNOSIS — Z0000 Encounter for general adult medical examination without abnormal findings: Secondary | ICD-10-CM

## 2016-06-18 DIAGNOSIS — F439 Reaction to severe stress, unspecified: Secondary | ICD-10-CM

## 2016-06-18 DIAGNOSIS — M5416 Radiculopathy, lumbar region: Secondary | ICD-10-CM

## 2016-06-18 DIAGNOSIS — I341 Nonrheumatic mitral (valve) prolapse: Secondary | ICD-10-CM

## 2016-06-18 DIAGNOSIS — Z01419 Encounter for gynecological examination (general) (routine) without abnormal findings: Secondary | ICD-10-CM | POA: Insufficient documentation

## 2016-06-18 DIAGNOSIS — Z1151 Encounter for screening for human papillomavirus (HPV): Secondary | ICD-10-CM | POA: Insufficient documentation

## 2016-06-18 DIAGNOSIS — E559 Vitamin D deficiency, unspecified: Secondary | ICD-10-CM | POA: Diagnosis not present

## 2016-06-18 DIAGNOSIS — Z124 Encounter for screening for malignant neoplasm of cervix: Secondary | ICD-10-CM | POA: Diagnosis not present

## 2016-06-18 DIAGNOSIS — G43809 Other migraine, not intractable, without status migrainosus: Secondary | ICD-10-CM

## 2016-06-18 DIAGNOSIS — G40909 Epilepsy, unspecified, not intractable, without status epilepticus: Secondary | ICD-10-CM | POA: Diagnosis not present

## 2016-06-18 DIAGNOSIS — Z1239 Encounter for other screening for malignant neoplasm of breast: Secondary | ICD-10-CM

## 2016-06-18 DIAGNOSIS — Z1231 Encounter for screening mammogram for malignant neoplasm of breast: Secondary | ICD-10-CM

## 2016-06-18 MED ORDER — LORAZEPAM 0.5 MG PO TABS: ORAL_TABLET | ORAL | 0 refills | Status: DC

## 2016-06-18 NOTE — Progress Notes (Signed)
Patient ID: Alyssa Bautista, female   DOB: 1970/12/20, 46 y.o.   MRN: [id number]   Subjective:    Patient ID: Alyssa Bautista, female    DOB: 04/20/1971, 46 y.o.   MRN: [id number]  HPI  Patient here for her physical exam.  Still with back issues and pain.  S/p injection.  She is doing better.  More mobile.  Has f/u 07/08/16.  No chest pain.  No sob.  No acid reflux.  No abdominal pain or cramping.  Bowels stable.  No urine change.  Is scheduled for f/u echo next week.  Still with increased stress.  On lexapro.  Overall she feels she is doing better.  Headaches are better.     Past Medical History:  Diagnosis Date  . Anxiety   . Arthritis   . Depression   . Environmental allergies   . Gestational diabetes   . History of chicken pox   . Migraines   . MVP (mitral valve prolapse)   . Seizures ([place])    Past Surgical History:  Procedure Laterality Date  . ABDOMINAL HYSTERECTOMY  02/2011   abnormal pap (stage III dysplasia).   ovaries not removed.  Marland Kitchen abscess removal  2007   cervix   . DILATION AND CURETTAGE OF UTERUS  2002, 2005  . LEEP  2008  . WISDOM TOOTH EXTRACTION  2003   Family History  Problem Relation Age of Onset  . Hypercholesterolemia Mother   . Hypercholesterolemia Father   . Diabetes Paternal Grandfather   . Prostate cancer Paternal Grandfather   . Breast cancer Maternal Aunt     50's  . Colon cancer Neg Hx    Social History   Social History  . Marital status: Single    Spouse name: N/A  . Number of children: 2  . Years of education: N/A   Social History Main Topics  . Smoking status: Never Smoker  . Smokeless tobacco: Never Used  . Alcohol use No     Comment: rarely  . Drug use: No  . Sexual activity: Not Asked   Other Topics Concern  . None   Social History Narrative  . None    Outpatient Encounter Prescriptions as of 06/18/2016  Medication Sig  . acetaminophen (TYLENOL) 500 MG tablet Take 500 mg by mouth as needed.  Marland Kitchen aspirin 325 MG  EC tablet Take 325 mg by mouth every 6 (six) hours as needed for pain.  Marland Kitchen escitalopram (LEXAPRO) 10 MG tablet TAKE 1 AND 1/2 TABLETS BY MOUTH DAILY  . HYDROcodone-acetaminophen (NORCO) 5-325 MG tablet Take 1 tablet by mouth every 6 (six) hours as needed for moderate pain.  Marland Kitchen levocetirizine (XYZAL) 5 MG tablet Take 5 mg by mouth every evening.  Marland Kitchen LORazepam (ATIVAN) 0.5 MG tablet 1/2 tablet q day prn  . montelukast (SINGULAIR) 10 MG tablet Take 10 mg by mouth at bedtime.  . phenytoin (DILANTIN) 100 MG ER capsule Take 1 capsule (100 mg total) by mouth 3 (three) times daily.  . SUMAtriptan (IMITREX) 100 MG tablet Take 100 mg by mouth daily as needed for migraine. May repeat in 2 hours if headache persists or recurs.  Marland Kitchen SYRINGE-NEEDLE, DISP, 3 ML 25G X 1" 3 ML MISC Use syringe/needle for each dose of B-12. Per B-12 orders.  . [DISCONTINUED] LORazepam (ATIVAN) 0.5 MG tablet 1/2 tablet q day prn   No facility-administered encounter medications on file as of 06/18/2016.     Review of Systems  Constitutional: Negative  for appetite change and unexpected weight change.  HENT: Negative for congestion and sinus pressure.   Eyes: Negative for pain and visual disturbance.  Respiratory: Negative for cough, chest tightness and shortness of breath.   Cardiovascular: Negative for chest pain, palpitations and leg swelling.  Gastrointestinal: Negative for abdominal pain, diarrhea, nausea and vomiting.  Genitourinary: Negative for difficulty urinating and dysuria.  Musculoskeletal: Negative for back pain and joint swelling.  Skin: Negative for color change and rash.  Neurological: Positive for headaches. Negative for dizziness and light-headedness.  Hematological: Negative for adenopathy. Does not bruise/bleed easily.  Psychiatric/Behavioral: Negative for agitation and dysphoric mood.       Increased stress as outlined.         Objective:    Physical Exam  Constitutional: She is oriented to person, place,  and time. She appears well-developed and well-nourished. No distress.  HENT:  Nose: Nose normal.  Mouth/Throat: Oropharynx is clear and moist.  Eyes: Right eye exhibits no discharge. Left eye exhibits no discharge. No scleral icterus.  Neck: Neck supple. No thyromegaly present.  Cardiovascular: Normal rate and regular rhythm.   Pulmonary/Chest: Breath sounds normal. No accessory muscle usage. No tachypnea. No respiratory distress. She has no decreased breath sounds. She has no wheezes. She has no rhonchi. Right breast exhibits no inverted nipple, no mass, no nipple discharge and no tenderness (no axillary adenopathy). Left breast exhibits no inverted nipple, no mass, no nipple discharge and no tenderness (no axilarry adenopathy).  Abdominal: Soft. Bowel sounds are normal. There is no tenderness.  Genitourinary:  Genitourinary Comments: Normal external genitalia.  Vaginal vault without lesions.  S/p hysterectomy.  Pap smear performed.  Could not appreciate any adnexal masses or tenderness.    Musculoskeletal: She exhibits no edema or tenderness.  Lymphadenopathy:    She has no cervical adenopathy.  Neurological: She is alert and oriented to person, place, and time.  Skin: Skin is warm. No rash noted. No erythema.  Psychiatric: She has a normal mood and affect. Her behavior is normal.    BP 124/78   Pulse 79   Temp 98.6 F (37 C) (Oral)   Ht 5\' 6"  (1.676 m)   Wt 148 lb (67.1 kg)   LMP 02/24/2011   SpO2 98%   BMI 23.89 kg/m  Wt Readings from Last 3 Encounters:  06/18/16 148 lb (67.1 kg)  06/04/16 148 lb 8 oz (67.4 kg)  05/26/16 148 lb (67.1 kg)     Lab Results  Component Value Date   WBC 8.4 06/18/2016   HGB 12.7 06/18/2016   HCT 37.9 06/18/2016   PLT 329 06/18/2016   GLUCOSE 86 10/29/2015   CHOL 177 10/29/2015   TRIG 101.0 10/29/2015   HDL 59.90 10/29/2015   LDLCALC 97 10/29/2015   ALT 17 06/18/2016   AST 11 06/18/2016   NA 141 10/29/2015   K 4.6 10/29/2015   CL 107  10/29/2015   CREATININE 0.71 10/29/2015   BUN 13 10/29/2015   CO2 29 10/29/2015   TSH 2.98 10/29/2015    Ct Lumbar Spine W Contrast  Result Date: 05/26/2016 CLINICAL DATA:  BILATERAL lower extremity pain, worse on the LEFT. Symptoms on the RIGHT were initially described, but improved after the lumbar epidural steroid injection. EXAM: CT MYELOGRAPHY LUMBAR SPINE TECHNIQUE: CT imaging of the lumbar spine was performed after intrathecal contrast administration. Multiplanar CT image reconstructions were also generated. COMPARISON:  MRI lumbar spine 04/30/2016. FINDINGS: Lumbar myelogram imaging reported separately. Segmentation:  5  lumbar type vertebral bodies. Alignment: Normal. Vertebrae: No fracture, evidence of discitis, or bone lesion. Conus medullaris: Extends to the upper L2 level and appears normal. Paraspinal and other soft tissues: Unremarkable. Disc levels: L1-L2:  Normal. L2-L3:  Normal. L3-L4:  Normal. L4-L5: Small protrusion on the RIGHT. Mild effacement of the RIGHT L5 nerve root, without significant subarticular zone narrowing or compression. Mild facet arthropathy. No stenosis. L5-S1: Central and leftward protrusion. Mild effacement LEFT S1 nerve root, without significant subarticular zone narrowing or compression. Mild facet arthropathy. No stenosis. Compared with prior MR, similar appearance. IMPRESSION: Small protrusions at L4-5 on the RIGHT and L5-S1 on the LEFT, without significant subarticular zone narrowing or RIGHT L5/LEFT S1 nerve root encroachment, respectively. Electronically Signed   By: Staci Righter M.D.   On: 05/26/2016 15:00   Dg Myelography Lumbar Inj Lumbosacral  Result Date: 05/26/2016 CLINICAL DATA:  Low back pain, bilateral leg pain for a month. EXAM: LUMBAR MYELOGRAM FLUOROSCOPY TIME:  0.9 minutes PROCEDURE: After thorough discussion of risks and benefits of the procedure including bleeding, infection, injury to nerves, blood vessels, adjacent structures as well as  headache and CSF leak, written and oral informed consent was obtained. Consent was obtained by Dr. Kathreen Devoid. Time out form was completed. Patient was positioned prone on the fluoroscopy table. Local anesthesia was provided with 1% lidocaine without epinephrine after prepped and draped in the usual sterile fashion. Puncture was performed at L4-5 using a 3 1/2 inch 22-gauge spinal needle via right paramedian approach. Using a single pass through the dura, the needle was placed within the thecal sac, with return of clear CSF. 15 mL of Isovue M 200 was injected into the thecal sac, with normal opacification of the nerve roots and cauda equina consistent with free flow within the subarachnoid space. I personally performed the lumbar puncture and administered the intrathecal contrast. I also personally supervised acquisition of the myelogram images. TECHNIQUE: Contiguous axial images were obtained through the Lumbar spine after the intrathecal infusion of infusion. Coronal and sagittal reconstructions were obtained of the axial image sets. The findings are reported on the CT of the lumbar spine with contrast. COMPARISON:  None FINDINGS: Intrathecal contrast is present. IMPRESSION: Successful fluoroscopic guided lumbar myelogram prior to CT. Electronically Signed   By: Kathreen Devoid   On: 05/26/2016 13:09       Assessment & Plan:   Problem List Items Addressed This Visit    Health care maintenance    Physical today 06/18/16.  PAP 06/18/16.  Schedule mammogram.        Lumbar radicular pain    MRI as outlined previously.  S/p injections.  Doing better.  Follow.        Migraine headache    Headaches improved.  Follow.       MVP (mitral valve prolapse)    ECHO with moderate MR.  Currently asymptomatic.  Planning for f/u echo next week.        Seizure disorder ([place])    Stable on current regimen.  Follow.  Check cbc and liver panel.        Relevant Orders   CBC with Differential/Platelet (Completed)    Hepatic function panel (Completed)   Dilantin (Phenytoin) level, total (Completed)   Stress    Overall handling stress well.  On lexapro.  Follow.        Vitamin D deficiency    Vitamin D supplements.  Follow vitamin D level.  Other Visit Diagnoses    Breast cancer screening    -  Primary   Relevant Orders   MM DIGITAL SCREENING BILATERAL   Routine cervical smear       Relevant Orders   Cytology - PAP (Completed)       Einar Pheasant, MD

## 2016-06-18 NOTE — Progress Notes (Signed)
Pre visit review using our clinic review tool, if applicable. No additional management support is needed unless otherwise documented below in the visit note. 

## 2016-06-19 LAB — HEPATIC FUNCTION PANEL
ALK PHOS: 63 U/L (ref 33–115)
ALT: 17 U/L (ref 6–29)
AST: 11 U/L (ref 10–30)
Albumin: 4.2 g/dL (ref 3.6–5.1)
BILIRUBIN DIRECT: 0 mg/dL (ref <=0.2)
Indirect Bilirubin: 0.2 mg/dL (ref 0.2–1.2)
Total Bilirubin: 0.2 mg/dL (ref 0.2–1.2)
Total Protein: 6.8 g/dL (ref 6.1–8.1)

## 2016-06-19 LAB — CBC WITH DIFFERENTIAL/PLATELET
BASOS ABS: 0 {cells}/uL (ref 0–200)
Basophils Relative: 0 %
Eosinophils Absolute: 0 cells/uL — ABNORMAL LOW (ref 15–500)
Eosinophils Relative: 0 %
HEMATOCRIT: 37.9 % (ref 35.0–45.0)
HEMOGLOBIN: 12.7 g/dL (ref 11.7–15.5)
Lymphocytes Relative: 20 %
Lymphs Abs: 1680 cells/uL (ref 850–3900)
MCH: 32.9 pg (ref 27.0–33.0)
MCHC: 33.5 g/dL (ref 32.0–36.0)
MCV: 98.2 fL (ref 80.0–100.0)
MONO ABS: 840 {cells}/uL (ref 200–950)
MPV: 9 fL (ref 7.5–12.5)
Monocytes Relative: 10 %
NEUTROS PCT: 70 %
Neutro Abs: 5880 cells/uL (ref 1500–7800)
Platelets: 329 10*3/uL (ref 140–400)
RBC: 3.86 MIL/uL (ref 3.80–5.10)
RDW: 14.2 % (ref 11.0–15.0)
WBC: 8.4 10*3/uL (ref 3.8–10.8)

## 2016-06-19 LAB — PHENYTOIN LEVEL, TOTAL: PHENYTOIN LVL: 15.8 ug/mL (ref 10.0–20.0)

## 2016-06-21 ENCOUNTER — Encounter: Payer: Self-pay | Admitting: Internal Medicine

## 2016-06-23 LAB — CYTOLOGY - PAP
ADEQUACY: ABSENT
Diagnosis: NEGATIVE
HPV: NOT DETECTED

## 2016-06-24 ENCOUNTER — Ambulatory Visit (INDEPENDENT_AMBULATORY_CARE_PROVIDER_SITE_OTHER): Payer: 59

## 2016-06-24 ENCOUNTER — Encounter: Payer: Self-pay | Admitting: Internal Medicine

## 2016-06-24 ENCOUNTER — Other Ambulatory Visit: Payer: Self-pay

## 2016-06-24 DIAGNOSIS — I341 Nonrheumatic mitral (valve) prolapse: Secondary | ICD-10-CM | POA: Diagnosis not present

## 2016-06-27 ENCOUNTER — Encounter: Payer: Self-pay | Admitting: Internal Medicine

## 2016-06-27 NOTE — Assessment & Plan Note (Signed)
Stable on current regimen.  Follow.  Check cbc and liver panel.

## 2016-06-27 NOTE — Assessment & Plan Note (Signed)
Physical today 06/18/16.  PAP 06/18/16.  Schedule mammogram.

## 2016-06-27 NOTE — Assessment & Plan Note (Signed)
ECHO with moderate MR.  Currently asymptomatic.  Planning for f/u echo next week.

## 2016-06-27 NOTE — Assessment & Plan Note (Signed)
Headaches improved.  Follow.

## 2016-06-27 NOTE — Assessment & Plan Note (Signed)
Overall handling stress well.  On lexapro.  Follow.

## 2016-06-27 NOTE — Assessment & Plan Note (Signed)
MRI as outlined previously.  S/p injections.  Doing better.  Follow.

## 2016-06-27 NOTE — Assessment & Plan Note (Signed)
Vitamin D supplements.  Follow vitamin D level.  

## 2016-07-01 ENCOUNTER — Other Ambulatory Visit: Payer: Self-pay

## 2016-07-01 DIAGNOSIS — I340 Nonrheumatic mitral (valve) insufficiency: Secondary | ICD-10-CM

## 2016-07-05 ENCOUNTER — Ambulatory Visit
Admission: RE | Admit: 2016-07-05 | Discharge: 2016-07-05 | Disposition: A | Discharge status: Home / Self Care | Payer: 59 | Source: Ambulatory Visit | Attending: Internal Medicine | Admitting: Internal Medicine

## 2016-07-05 ENCOUNTER — Encounter: Payer: Self-pay | Admitting: Internal Medicine

## 2016-07-05 DIAGNOSIS — Z1231 Encounter for screening mammogram for malignant neoplasm of breast: Secondary | ICD-10-CM | POA: Insufficient documentation

## 2016-07-05 DIAGNOSIS — Z1239 Encounter for other screening for malignant neoplasm of breast: Secondary | ICD-10-CM

## 2016-07-08 DIAGNOSIS — M5127 Other intervertebral disc displacement, lumbosacral region: Secondary | ICD-10-CM | POA: Diagnosis not present

## 2016-07-08 DIAGNOSIS — M5416 Radiculopathy, lumbar region: Secondary | ICD-10-CM | POA: Diagnosis not present

## 2016-07-14 ENCOUNTER — Encounter: Payer: Self-pay | Admitting: *Deleted

## 2016-07-14 ENCOUNTER — Emergency Department
Admission: EM | Admit: 2016-07-14 | Discharge: 2016-07-14 | Disposition: A | Discharge status: Home / Self Care | Payer: 59 | Attending: Emergency Medicine | Admitting: Emergency Medicine

## 2016-07-14 DIAGNOSIS — Z79899 Other long term (current) drug therapy: Secondary | ICD-10-CM | POA: Diagnosis not present

## 2016-07-14 DIAGNOSIS — R51 Headache: Secondary | ICD-10-CM | POA: Diagnosis present

## 2016-07-14 DIAGNOSIS — Z7982 Long term (current) use of aspirin: Secondary | ICD-10-CM | POA: Insufficient documentation

## 2016-07-14 DIAGNOSIS — G43909 Migraine, unspecified, not intractable, without status migrainosus: Secondary | ICD-10-CM | POA: Diagnosis not present

## 2016-07-14 MED ORDER — SODIUM CHLORIDE 0.9 % IV BOLUS (SEPSIS)
500.0000 mL | INTRAVENOUS | Status: AC
  Administered 2016-07-14: 500 mL via INTRAVENOUS

## 2016-07-14 MED ORDER — KETOROLAC TROMETHAMINE 30 MG/ML IJ SOLN
15.0000 mg | Freq: Once | INTRAMUSCULAR | Status: AC
  Administered 2016-07-14: 15 mg via INTRAVENOUS
  Filled 2016-07-14: qty 1

## 2016-07-14 MED ORDER — DEXAMETHASONE SODIUM PHOSPHATE 10 MG/ML IJ SOLN
10.0000 mg | Freq: Once | INTRAMUSCULAR | Status: AC
  Administered 2016-07-14: 10 mg via INTRAVENOUS
  Filled 2016-07-14: qty 1

## 2016-07-14 MED ORDER — DIPHENHYDRAMINE HCL 50 MG/ML IJ SOLN
25.0000 mg | INTRAMUSCULAR | Status: AC
  Administered 2016-07-14: 25 mg via INTRAVENOUS
  Filled 2016-07-14: qty 1

## 2016-07-14 MED ORDER — ACETAMINOPHEN 500 MG PO TABS
1000.0000 mg | ORAL_TABLET | Freq: Once | ORAL | Status: AC
  Administered 2016-07-14: 1000 mg via ORAL
  Filled 2016-07-14: qty 2

## 2016-07-14 MED ORDER — METOCLOPRAMIDE HCL 5 MG/ML IJ SOLN
10.0000 mg | INTRAMUSCULAR | Status: AC
  Administered 2016-07-14: 10 mg via INTRAVENOUS
  Filled 2016-07-14: qty 2

## 2016-07-14 NOTE — ED Provider Notes (Signed)
[facility] Emergency Department Provider Note  ____________________________________________   First MD Initiated Contact with Patient 07/14/16 1533     (approximate)  I have reviewed the triage vital signs and the nursing notes.   HISTORY  Chief Complaint Migraine    HPI Alyssa Bautista is a 46 y.o. female with a history of migraines who has seen Dr. Manuella Ghazi in the [place] for evaluation of gradually worsening right sided sharp and throbbing headache that began and was mild last night but has been severe since this morning.  Nothing makes it feel better.  Light, loud noises, and activity makes it feel worse.  She has had nausea but no vomiting.  She has had no visual changes.  She states that it feels similar but worse than her usual migraines.  She has Imitrex at home but does not take it regularly and has not taken at this time because she states that the side effects of the medicines are worse than the headache.  She denies recent illness including no sinus disease or pain and no recent viral symptoms.  She has had no injury or trauma recently.  She denies fever/chills, chest pain, shortness of breath, abdominal pain, dysuria.   Past Medical History:  Diagnosis Date  . Anxiety   . Arthritis   . Depression   . Environmental allergies   . Gestational diabetes   . History of chicken pox   . Migraines   . MVP (mitral valve prolapse)   . Seizures [facility])     Patient Active Problem List   Diagnosis Date Noted  . Lumbar radicular pain 09/27/2014  . Health care maintenance 06/09/2014  . Vitamin D deficiency 06/07/2014  . Murmur 04/28/2014  . Stress 03/11/2014  . Right leg pain 10/01/2013  . Sciatica 06/04/2013  . Viral syndrome 06/04/2013  . Seizure disorder ([place]) 05/28/2012  . Depression 05/28/2012  . Migraine headache 05/28/2012  . MVP (mitral valve prolapse) 05/28/2012    Past Surgical History:  Procedure Laterality Date  .  ABDOMINAL HYSTERECTOMY  02/2011   abnormal pap (stage III dysplasia).   ovaries not removed.  Marland Kitchen abscess removal  2007   cervix   . DILATION AND CURETTAGE OF UTERUS  2002, 2005  . LEEP  2008  . WISDOM TOOTH EXTRACTION  2003    Prior to Admission medications   Medication Sig Start Date End Date Taking? Authorizing Provider  acetaminophen (TYLENOL) 500 MG tablet Take 500 mg by mouth as needed.    Historical Provider, MD  aspirin 325 MG EC tablet Take 325 mg by mouth every 6 (six) hours as needed for pain.    Historical Provider, MD  escitalopram (LEXAPRO) 10 MG tablet TAKE 1 AND 1/2 TABLETS BY MOUTH DAILY 05/01/15   Einar Pheasant, MD  HYDROcodone-acetaminophen (NORCO) 5-325 MG tablet Take 1 tablet by mouth every 6 (six) hours as needed for moderate pain. 05/11/16   Burnard Hawthorne, FNP  levocetirizine (XYZAL) 5 MG tablet Take 5 mg by mouth every evening.    Historical Provider, MD  LORazepam (ATIVAN) 0.5 MG tablet 1/2 tablet q day prn 06/18/16   Einar Pheasant, MD  montelukast (SINGULAIR) 10 MG tablet Take 10 mg by mouth at bedtime.    Historical Provider, MD  phenytoin (DILANTIN) 100 MG ER capsule Take 1 capsule (100 mg total) by mouth 3 (three) times daily. 02/20/15   Einar Pheasant, MD  SUMAtriptan (IMITREX) 100 MG tablet Take 100 mg by mouth  daily as needed for migraine. May repeat in 2 hours if headache persists or recurs.    Historical Provider, MD  SYRINGE-NEEDLE, DISP, 3 ML 25G X 1" 3 ML MISC Use syringe/needle for each dose of B-12. Per B-12 orders. 10/29/14   Einar Pheasant, MD    Allergies Penicillins; Azithromycin; Dilantin [phenytoin]; and Clindamycin/lincomycin  Family History  Problem Relation Age of Onset  . Hypercholesterolemia Mother   . Hypercholesterolemia Father   . Diabetes Paternal Grandfather   . Prostate cancer Paternal Grandfather   . Breast cancer Maternal Aunt     50's  . Colon cancer Neg Hx     Social History Social History  Substance Use Topics  .  Smoking status: Never Smoker  . Smokeless tobacco: Never Used  . Alcohol use No     Comment: rarely    Review of Systems Constitutional: No fever/chills Eyes: No visual changes. ENT: No sore throat. Cardiovascular: Denies chest pain. Respiratory: Denies shortness of breath. Gastrointestinal: No abdominal pain.  Nausea, no vomiting.  No diarrhea.  No constipation. Genitourinary: Negative for dysuria. Musculoskeletal: Negative for back pain. Skin: Negative for rash. Neurological: Right sided sharp and throbbing headache since yesterday.  No numbness or weakness in her extremities  10-point ROS otherwise negative.  ____________________________________________   PHYSICAL EXAM:  VITAL SIGNS: ED Triage Vitals  Enc Vitals Group     BP 07/14/16 1445 (!) 144/88     Pulse Rate 07/14/16 1445 (!) 101     Resp 07/14/16 1445 18     Temp 07/14/16 1445 98.4 F (36.9 C)     Temp Source 07/14/16 1445 Oral     SpO2 07/14/16 1445 98 %     Weight 07/14/16 1445 147 lb (66.7 kg)     Height 07/14/16 1445 5\' 6"  (1.676 m)     Head Circumference --      Peak Flow --      Pain Score 07/14/16 1446 10     Pain Loc --      Pain Edu? --      Excl. in [place]? --     Constitutional: Alert and oriented. Well appearing and in no acute distress. Eyes: Conjunctivae are normal. PERRL. EOMI. + photophobia Head: Atraumatic. Nose: No congestion/rhinnorhea. Mouth/Throat: Mucous membranes are moist. Neck: No stridor.  No meningeal signs.   Cardiovascular: Normal rate, regular rhythm. Good peripheral circulation. Grossly normal heart sounds. Respiratory: Normal respiratory effort.  No retractions. Lungs CTAB. Gastrointestinal: Soft and nontender. No distention.  Musculoskeletal: No lower extremity tenderness nor edema. No gross deformities of extremities. Neurologic:  Normal speech and language. No gross focal neurologic deficits are appreciated.  Skin:  Skin is warm, dry and intact. No rash  noted. Psychiatric: Mood and affect are normal. Speech and behavior are normal.  ____________________________________________   LABS (all labs ordered are listed, but only abnormal results are displayed)  Labs Reviewed - No data to display ____________________________________________  EKG  None - EKG not ordered by ED physician ____________________________________________  RADIOLOGY   No results found.  ____________________________________________   PROCEDURES  Procedure(s) performed:   Procedures   Critical Care performed: No ____________________________________________   INITIAL IMPRESSION / ASSESSMENT AND PLAN / ED COURSE  Pertinent labs & imaging results that were available during my care of the patient were reviewed by me and considered in my medical decision making (see chart for details).  Differential diagnosis includes but is not exclusive to subarachnoid hemorrhage, meningitis, encephalitis, previous head trauma, cavernous  venous thrombosis, muscle tension headache, temporal arteritis, migraine or migraine equivalent, etc. however, the patient's exam is reassuring and her signs and symptoms are most consistent with migraine headache.  I will treat empirically with my usual "migraine cocktail" and reassess.  No indication for imaging at this time.   Clinical Course as of Jul 14 1713  Wed Jul 14, 2016  1713 The patient states she feels much better and has been sleeping comfortably.  I encouraged her to follow up with Dr. Manuella Ghazi as planned.I gave my usual and customary return precautions.   [CF]    Clinical Course User Index [CF] Hinda Kehr, MD    ____________________________________________  FINAL CLINICAL IMPRESSION(S) / ED DIAGNOSES  Final diagnoses:  Migraine without status migrainosus, not intractable, unspecified migraine type     MEDICATIONS GIVEN DURING THIS VISIT:  Medications  ketorolac (TORADOL) 30 MG/ML injection 15 mg (15 mg  Intravenous Given 07/14/16 1604)  dexamethasone (DECADRON) injection 10 mg (10 mg Intravenous Given 07/14/16 1605)  metoCLOPramide (REGLAN) injection 10 mg (10 mg Intravenous Given 07/14/16 1604)  diphenhydrAMINE (BENADRYL) injection 25 mg (25 mg Intravenous Given 07/14/16 1603)  sodium chloride 0.9 % bolus 500 mL (500 mLs Intravenous New Bag/Given 07/14/16 1605)  acetaminophen (TYLENOL) tablet 1,000 mg (1,000 mg Oral Given 07/14/16 1601)     NEW OUTPATIENT MEDICATIONS STARTED DURING THIS VISIT:  New Prescriptions   No medications on file    Modified Medications   No medications on file    Discontinued Medications   No medications on file     Note:  This document was prepared using Dragon voice recognition software and may include unintentional dictation errors.    Hinda Kehr, MD 07/14/16 [id number]

## 2016-07-14 NOTE — Discharge Instructions (Signed)

## 2016-07-14 NOTE — ED Notes (Signed)
Pt c/o R side head pain that began yesterday, denies injury. Sts she has hx of migraine but not normally 'this intense'. Photosensitivity.  Denies n/v/d. NAD

## 2016-07-14 NOTE — ED Triage Notes (Signed)
States a migrane that began this AM, states photophobia at present, states she has Imitrex at home but has not taken any in several months, hx of migranes

## 2016-07-15 ENCOUNTER — Encounter: Payer: Self-pay | Admitting: Cardiovascular Disease

## 2016-08-18 ENCOUNTER — Other Ambulatory Visit: Payer: Self-pay | Admitting: Internal Medicine

## 2016-08-18 ENCOUNTER — Encounter: Payer: Self-pay | Admitting: Internal Medicine

## 2016-08-24 ENCOUNTER — Other Ambulatory Visit: Payer: Self-pay

## 2016-08-24 ENCOUNTER — Telehealth: Payer: Self-pay | Admitting: Cardiovascular Disease

## 2016-08-24 MED ORDER — METOPROLOL SUCCINATE ER 25 MG PO TB24: 25.0000 mg | ORAL_TABLET | Freq: Every day | ORAL | 3 refills | Status: DC

## 2016-08-24 NOTE — Telephone Encounter (Signed)
Patient c/o Palpitations:  High priority if patient c/o lightheadedness and shortness of breath.  1. How long have you been having palpitations?  On and off 1-2 days   2. Are you currently experiencing lightheadedness and shortness of breath   No   3. Have you checked your BP and heart rate? (document readings)    hasnt checked yet   4. Are you experiencing any other symptoms?  Stress at home with mother having L arm pain nagging pain   Patient tearful on phone and is worried that she should be checked out wants to be seen today

## 2016-08-24 NOTE — Telephone Encounter (Signed)
Per Dr. Fletcher Anon, add toprolol 25mg  QD. Notified pt who is agreeable w/plan. Prescription submitted to [facility] [facility]

## 2016-08-24 NOTE — Telephone Encounter (Signed)
Pt reports palpitations 1-2 days accompanied by left arm pain this morning. There is a lot of stress at home with her mother and she knows this is playing a role in sx . Denies chest or jaw pain, nausea, vomiting, back or shoulder pain. She had a migraine this weekend which often causes BP and HR to increase. Recent MyChart message suggested possibly adding small dose betablocker. Pt will check VS now and CB with readings. Will await recommendations from Dr. Fletcher Anon.

## 2016-08-24 NOTE — Telephone Encounter (Signed)
Patient calling back 152 pm  bp 141/84 hr 104

## 2016-08-31 ENCOUNTER — Telehealth: Payer: Self-pay | Admitting: *Deleted

## 2016-08-31 NOTE — Telephone Encounter (Signed)
Please verify is patients daughter can be scheduled with Dr. Nicki Reaper. Daughter is 46 yrs old

## 2016-08-31 NOTE — Telephone Encounter (Signed)
Ok

## 2016-08-31 NOTE — Telephone Encounter (Signed)
Please advise 

## 2016-09-01 NOTE — Telephone Encounter (Signed)
Lm for pt to let her know Dr. Nicki Reaper will take her daughter on as a new pt

## 2016-09-14 DIAGNOSIS — M5416 Radiculopathy, lumbar region: Secondary | ICD-10-CM | POA: Diagnosis not present

## 2016-09-20 ENCOUNTER — Encounter: Payer: Self-pay | Admitting: Internal Medicine

## 2016-09-20 ENCOUNTER — Ambulatory Visit
Admission: RE | Admit: 2016-09-20 | Discharge: 2016-09-20 | Disposition: A | Discharge status: Home / Self Care | Payer: 59 | Source: Ambulatory Visit | Attending: Neurology | Admitting: Neurology

## 2016-09-20 DIAGNOSIS — E2839 Other primary ovarian failure: Secondary | ICD-10-CM | POA: Diagnosis not present

## 2016-09-20 DIAGNOSIS — G40209 Localization-related (focal) (partial) symptomatic epilepsy and epileptic syndromes with complex partial seizures, not intractable, without status epilepticus: Secondary | ICD-10-CM | POA: Insufficient documentation

## 2016-09-20 DIAGNOSIS — Z79899 Other long term (current) drug therapy: Secondary | ICD-10-CM

## 2016-09-27 DIAGNOSIS — M5126 Other intervertebral disc displacement, lumbar region: Secondary | ICD-10-CM | POA: Diagnosis not present

## 2016-09-27 DIAGNOSIS — M5416 Radiculopathy, lumbar region: Secondary | ICD-10-CM | POA: Diagnosis not present

## 2016-09-27 DIAGNOSIS — R202 Paresthesia of skin: Secondary | ICD-10-CM | POA: Diagnosis not present

## 2016-09-27 DIAGNOSIS — M79605 Pain in left leg: Secondary | ICD-10-CM | POA: Diagnosis not present

## 2016-09-27 DIAGNOSIS — M79604 Pain in right leg: Secondary | ICD-10-CM | POA: Diagnosis not present

## 2016-10-01 ENCOUNTER — Ambulatory Visit (INDEPENDENT_AMBULATORY_CARE_PROVIDER_SITE_OTHER): Payer: 59 | Admitting: Internal Medicine

## 2016-10-01 ENCOUNTER — Encounter: Payer: Self-pay | Admitting: Internal Medicine

## 2016-10-01 DIAGNOSIS — G40909 Epilepsy, unspecified, not intractable, without status epilepticus: Secondary | ICD-10-CM

## 2016-10-01 DIAGNOSIS — I341 Nonrheumatic mitral (valve) prolapse: Secondary | ICD-10-CM | POA: Diagnosis not present

## 2016-10-01 DIAGNOSIS — G43809 Other migraine, not intractable, without status migrainosus: Secondary | ICD-10-CM | POA: Diagnosis not present

## 2016-10-01 DIAGNOSIS — F439 Reaction to severe stress, unspecified: Secondary | ICD-10-CM | POA: Diagnosis not present

## 2016-10-01 DIAGNOSIS — F32A Depression, unspecified: Secondary | ICD-10-CM

## 2016-10-01 DIAGNOSIS — F329 Major depressive disorder, single episode, unspecified: Secondary | ICD-10-CM | POA: Diagnosis not present

## 2016-10-01 DIAGNOSIS — M5416 Radiculopathy, lumbar region: Secondary | ICD-10-CM

## 2016-10-01 MED ORDER — LORAZEPAM 0.5 MG PO TABS
ORAL_TABLET | ORAL | 0 refills | Status: DC
Start: 2016-10-01 — End: 2016-12-06

## 2016-10-01 NOTE — Progress Notes (Signed)
Pre-visit discussion using our clinic review tool. No additional management support is needed unless otherwise documented below in the visit note.  

## 2016-10-01 NOTE — Progress Notes (Signed)
Patient ID: Alyssa Bautista, female   DOB: 08/12/1970, 46 y.o.   MRN: [id number]   Subjective:    Patient ID: Alyssa Bautista, female    DOB: 07-09-1970, 46 y.o.   MRN: [id number]  HPI  Patient here for a scheduled follow up.  Still having increased back pain.  Notes reviewed.  Had NCS - normal.  S/p injections.  Helped some.  Left leg is better.  Right leg bothering her more.  Has started water aerobics and yoga.  Still with increased pain.  Plans to f/u with pain MD in 10/2016.  On lyrica now.  titrating slowly.  On 75mg  q day.  Headaches are better.  Improved since she cut her hair and not wearing it pulled back.  Overall feels things are stable, just increased frustration with persistent pain.  Handling stress relatively well.  Does not feel needs any further intervention.     Past Medical History:  Diagnosis Date  . Anxiety   . Arthritis   . Depression   . Environmental allergies   . Gestational diabetes   . History of chicken pox   . Migraines   . MVP (mitral valve prolapse)   . Seizures ([place])    Past Surgical History:  Procedure Laterality Date  . ABDOMINAL HYSTERECTOMY  02/2011   abnormal pap (stage III dysplasia).   ovaries not removed.  Marland Kitchen abscess removal  2007   cervix   . DILATION AND CURETTAGE OF UTERUS  2002, 2005  . LEEP  2008  . WISDOM TOOTH EXTRACTION  2003   Family History  Problem Relation Age of Onset  . Hypercholesterolemia Mother   . Hypercholesterolemia Father   . Diabetes Paternal Grandfather   . Prostate cancer Paternal Grandfather   . Breast cancer Maternal Aunt        50's  . Colon cancer Neg Hx    Social History   Social History  . Marital status: Divorced    Spouse name: N/A  . Number of children: 2  . Years of education: N/A   Social History Main Topics  . Smoking status: Never Smoker  . Smokeless tobacco: Never Used  . Alcohol use No     Comment: rarely  . Drug use: No  . Sexual activity: Not Asked   Other Topics  Concern  . None   Social History Narrative  . None    Outpatient Encounter Prescriptions as of 10/01/2016  Medication Sig  . acetaminophen (TYLENOL) 500 MG tablet Take 500 mg by mouth as needed.  Marland Kitchen aspirin 325 MG EC tablet Take 325 mg by mouth every 6 (six) hours as needed for pain.  Marland Kitchen escitalopram (LEXAPRO) 10 MG tablet TAKE 1 AND 1/2 TABLETS BY MOUTH DAILY  . levocetirizine (XYZAL) 5 MG tablet Take 5 mg by mouth every evening.  Marland Kitchen LORazepam (ATIVAN) 0.5 MG tablet 1/2 tablet q day prn  . LYRICA 75 MG capsule Take 1 tablet by mouth daily.  . montelukast (SINGULAIR) 10 MG tablet Take 10 mg by mouth at bedtime.  . phenytoin (DILANTIN) 100 MG ER capsule Take 1 capsule (100 mg total) by mouth 3 (three) times daily.  . SUMAtriptan (IMITREX) 100 MG tablet Take 100 mg by mouth daily as needed for migraine. May repeat in 2 hours if headache persists or recurs.  . [DISCONTINUED] escitalopram (LEXAPRO) 10 MG tablet TAKE 1 AND 1/2 TABLETS BY MOUTH DAILY  . [DISCONTINUED] HYDROcodone-acetaminophen (NORCO) 5-325 MG tablet Take 1 tablet by mouth  every 6 (six) hours as needed for moderate pain.  . [DISCONTINUED] LORazepam (ATIVAN) 0.5 MG tablet 1/2 tablet q day prn  . [DISCONTINUED] metoprolol succinate (TOPROL XL) 25 MG 24 hr tablet Take 1 tablet (25 mg total) by mouth daily.  . [DISCONTINUED] SYRINGE-NEEDLE, DISP, 3 ML 25G X 1" 3 ML MISC Use syringe/needle for each dose of B-12. Per B-12 orders.   No facility-administered encounter medications on file as of 10/01/2016.     Review of Systems  Constitutional: Negative for appetite change and unexpected weight change.  HENT: Negative for congestion and sinus pressure.   Respiratory: Negative for cough, chest tightness and shortness of breath.   Cardiovascular: Negative for chest pain, palpitations and leg swelling.  Gastrointestinal: Negative for diarrhea and vomiting.  Genitourinary: Negative for difficulty urinating.  Musculoskeletal: Positive for  back pain. Negative for joint swelling.  Skin: Negative for color change and rash.  Neurological: Positive for headaches. Negative for dizziness and light-headedness.  Psychiatric/Behavioral: Negative for agitation and dysphoric mood.       Increased stress as outlined.         Objective:    Physical Exam  Constitutional: She appears well-developed and well-nourished. No distress.  HENT:  Nose: Nose normal.  Mouth/Throat: Oropharynx is clear and moist.  Neck: Neck supple. No thyromegaly present.  Cardiovascular: Normal rate and regular rhythm.   Pulmonary/Chest: Breath sounds normal. No respiratory distress. She has no wheezes.  Abdominal: Soft. Bowel sounds are normal. There is no tenderness.  Musculoskeletal: She exhibits no edema or tenderness.  Lymphadenopathy:    She has no cervical adenopathy.  Skin: No rash noted. No erythema.  Psychiatric: She has a normal mood and affect. Her behavior is normal.    BP 112/74 (BP Location: Left Arm, Patient Position: Sitting, Cuff Size: Normal)   Pulse 85   Temp 99 F (37.2 C) (Oral)   Resp 12   Ht 5\' 6"  (1.676 m)   Wt 152 lb 6.4 oz (69.1 kg)   LMP 02/24/2011   SpO2 98%   BMI 24.60 kg/m  Wt Readings from Last 3 Encounters:  10/01/16 152 lb 6.4 oz (69.1 kg)  07/14/16 147 lb (66.7 kg)  06/18/16 148 lb (67.1 kg)     Lab Results  Component Value Date   WBC 8.4 06/18/2016   HGB 12.7 06/18/2016   HCT 37.9 06/18/2016   PLT 329 06/18/2016   GLUCOSE 86 10/29/2015   CHOL 177 10/29/2015   TRIG 101.0 10/29/2015   HDL 59.90 10/29/2015   LDLCALC 97 10/29/2015   ALT 17 06/18/2016   AST 11 06/18/2016   NA 141 10/29/2015   K 4.6 10/29/2015   CL 107 10/29/2015   CREATININE 0.71 10/29/2015   BUN 13 10/29/2015   CO2 29 10/29/2015   TSH 2.98 10/29/2015    Dg Bone Density (dxa)  Result Date: 09/20/2016 EXAM: DUAL X-RAY ABSORPTIOMETRY (DXA) FOR BONE MINERAL DENSITY IMPRESSION: Dear Dr Vladimir Crofts, Your patient Michela Herst  completed a BMD test on 09/20/2016 using the [place] (analysis version: 14.10) manufactured by [employer]. The following summarizes the results of our evaluation. PATIENT BIOGRAPHICAL: Name: Hind, Chesler Patient ID: [id number] Birth Date: 03/18/1971 Height: 66.0 in. Gender: Female Exam Date: 09/20/2016 Weight: 147.0 lbs. Indications: Hysterectomy, Postmenopausal Fractures: Left foot Treatments: Calcium, dilantin, Vitamin D ASSESSMENT: The BMD measured at Femur Neck Right is 1.023 g/cm2 with a T-score of -0.1. This patient is considered normal according to World  Health Organization [facility]) criteria. Site Region Measured Measured WHO Young Adult BMD Date       Age      Classification T-score AP Spine L1-L4 09/20/2016 43.6 Normal 1.0 1.316 g/cm2 DualFemur Neck Right 09/20/2016 43.6 Normal -0.1 1.023 g/cm2 World Health Organization [facility]) criteria for post-menopausal, Caucasian Women: Normal:       T-score at or above -1 SD Osteopenia:   T-score between -1 and -2.5 SD Osteoporosis: T-score at or below -2.5 SD RECOMMENDATIONS: [place] recommends that FDA-approved medical therapies be considered in postmenopausal women and men age 110 or older with a: 1. Hip or vertebral (clinical or morphometric) fracture. 2. T-score of < -2.5 at the spine or hip. 3. Ten-year fracture probability by FRAX of 3% or greater for hip fracture or 20% or greater for major osteoporotic fracture. All treatment decisions require clinical judgment and consideration of individual patient factors, including patient preferences, co-morbidities, previous drug use, risk factors not captured in the FRAX model (e.g. falls, vitamin D deficiency, increased bone turnover, interval significant decline in bone density) and possible under - or over-estimation of fracture risk by FRAX. All patients should ensure an adequate intake of dietary calcium (1200 mg/d) and vitamin D (800 IU daily) unless contraindicated.  FOLLOW-UP: People with diagnosed cases of osteoporosis or at high risk for fracture should have regular bone mineral density tests. For patients eligible for Medicare, routine testing is allowed once every 2 years. The testing frequency can be increased to one year for patients who have rapidly progressing disease, those who are receiving or discontinuing medical therapy to restore bone mass, or have additional risk factors. I have reviewed this report, and agree with the above findings. [facility] Radiology Electronically Signed   By: Nolon Nations M.D.   On: 09/20/2016 15:30       Assessment & Plan:   Problem List Items Addressed This Visit    Depression    Overall feels things are stable.  On lexapro.        Relevant Medications   LORazepam (ATIVAN) 0.5 MG tablet   Lumbar radicular pain    S/p injections.  Helped some.  Persistent pain.  Has f/u planned with pain MD.        Relevant Medications   LYRICA 75 MG capsule   LORazepam (ATIVAN) 0.5 MG tablet   Migraine headache    Improved.  Follow.        Relevant Medications   LYRICA 75 MG capsule   MVP (mitral valve prolapse)    ECHO with moderate MR.  Currently asymptomatic.  Planning for f/u echo yearly.        Seizure disorder ([place])    Stable.  Follow.        Stress    On lexapro.  Overall feels she is handling things relatively well.  Follow.            Einar Pheasant, MD

## 2016-10-04 ENCOUNTER — Encounter: Payer: Self-pay | Admitting: Internal Medicine

## 2016-10-04 NOTE — Assessment & Plan Note (Signed)
Overall feels things are stable.  On lexapro.

## 2016-10-04 NOTE — Assessment & Plan Note (Signed)
ECHO with moderate MR.  Currently asymptomatic.  Planning for f/u echo yearly.

## 2016-10-04 NOTE — Assessment & Plan Note (Signed)
S/p injections.  Helped some.  Persistent pain.  Has f/u planned with pain MD.

## 2016-10-04 NOTE — Assessment & Plan Note (Signed)
Stable.  Follow.   

## 2016-10-04 NOTE — Assessment & Plan Note (Signed)
Improved.  Follow.  

## 2016-10-04 NOTE — Assessment & Plan Note (Signed)
On lexapro.  Overall feels she is handling things relatively well.  Follow.

## 2016-10-19 DIAGNOSIS — R030 Elevated blood-pressure reading, without diagnosis of hypertension: Secondary | ICD-10-CM | POA: Diagnosis not present

## 2016-10-19 DIAGNOSIS — M533 Sacrococcygeal disorders, not elsewhere classified: Secondary | ICD-10-CM | POA: Diagnosis not present

## 2016-12-03 ENCOUNTER — Encounter: Payer: Self-pay | Admitting: Internal Medicine

## 2016-12-06 ENCOUNTER — Encounter: Payer: Self-pay | Admitting: Internal Medicine

## 2016-12-06 ENCOUNTER — Other Ambulatory Visit: Payer: Self-pay | Admitting: Internal Medicine

## 2016-12-06 MED ORDER — LORAZEPAM 0.5 MG PO TABS
ORAL_TABLET | ORAL | 0 refills | Status: DC
Start: 2016-12-06 — End: 2017-03-04

## 2016-12-06 NOTE — Telephone Encounter (Signed)
Called patient she would like faxed to [facility]. I have sent it. She will call if needs anything further.

## 2016-12-06 NOTE — Telephone Encounter (Signed)
Please call pt and let her know that I have ok'd rx for lorazepam.  rx ok'd.  Let us know if she needs anything.

## 2016-12-07 DIAGNOSIS — J3089 Other allergic rhinitis: Secondary | ICD-10-CM | POA: Diagnosis not present

## 2016-12-07 DIAGNOSIS — H1045 Other chronic allergic conjunctivitis: Secondary | ICD-10-CM | POA: Diagnosis not present

## 2016-12-07 DIAGNOSIS — J3081 Allergic rhinitis due to animal (cat) (dog) hair and dander: Secondary | ICD-10-CM | POA: Diagnosis not present

## 2016-12-07 DIAGNOSIS — J301 Allergic rhinitis due to pollen: Secondary | ICD-10-CM | POA: Diagnosis not present

## 2016-12-15 DIAGNOSIS — J301 Allergic rhinitis due to pollen: Secondary | ICD-10-CM | POA: Diagnosis not present

## 2016-12-16 DIAGNOSIS — J3081 Allergic rhinitis due to animal (cat) (dog) hair and dander: Secondary | ICD-10-CM | POA: Diagnosis not present

## 2016-12-16 DIAGNOSIS — J3089 Other allergic rhinitis: Secondary | ICD-10-CM | POA: Diagnosis not present

## 2016-12-24 ENCOUNTER — Encounter: Payer: Self-pay | Admitting: Internal Medicine

## 2016-12-24 MED ORDER — ESCITALOPRAM OXALATE 10 MG PO TABS: 15.0000 mg | ORAL_TABLET | Freq: Every day | ORAL | 2 refills | Status: DC

## 2016-12-24 NOTE — Telephone Encounter (Signed)
rx ok'd for lexapro #45 with 2 refills.

## 2016-12-24 NOTE — Telephone Encounter (Signed)
Pt returned office phone call. Please call her at [phone].

## 2016-12-29 NOTE — Telephone Encounter (Signed)
Patient called and given information

## 2017-03-04 ENCOUNTER — Ambulatory Visit (INDEPENDENT_AMBULATORY_CARE_PROVIDER_SITE_OTHER): Payer: Medicaid Other | Admitting: Internal Medicine

## 2017-03-04 ENCOUNTER — Encounter: Payer: Self-pay | Admitting: Internal Medicine

## 2017-03-04 VITALS — BP 114/78 | HR 98 | Temp 98.9°F | Resp 14 | Ht 66.0 in | Wt 153.6 lb

## 2017-03-04 DIAGNOSIS — Z9109 Other allergy status, other than to drugs and biological substances: Secondary | ICD-10-CM | POA: Diagnosis not present

## 2017-03-04 DIAGNOSIS — F439 Reaction to severe stress, unspecified: Secondary | ICD-10-CM | POA: Diagnosis not present

## 2017-03-04 DIAGNOSIS — G40909 Epilepsy, unspecified, not intractable, without status epilepticus: Secondary | ICD-10-CM | POA: Diagnosis not present

## 2017-03-04 DIAGNOSIS — E559 Vitamin D deficiency, unspecified: Secondary | ICD-10-CM

## 2017-03-04 DIAGNOSIS — Z13220 Encounter for screening for lipoid disorders: Secondary | ICD-10-CM | POA: Diagnosis not present

## 2017-03-04 DIAGNOSIS — R221 Localized swelling, mass and lump, neck: Secondary | ICD-10-CM | POA: Diagnosis not present

## 2017-03-04 DIAGNOSIS — G43809 Other migraine, not intractable, without status migrainosus: Secondary | ICD-10-CM | POA: Diagnosis not present

## 2017-03-04 MED ORDER — LORAZEPAM 0.5 MG PO TABS: ORAL_TABLET | ORAL | 0 refills | Status: DC

## 2017-03-04 MED ORDER — ESCITALOPRAM OXALATE 10 MG PO TABS: 15.0000 mg | ORAL_TABLET | Freq: Every day | ORAL | 3 refills | Status: DC

## 2017-03-04 NOTE — Progress Notes (Signed)
Patient ID: Alyssa Bautista, female   DOB: 1971/04/21, 46 y.o.   MRN: [id number]   Subjective:    Patient ID: Alyssa Bautista, female    DOB: January 28, 1971, 46 y.o.   MRN: [id number]  HPI  Patient here for a scheduled follow up.  She reports she is doing relatively well.  Lost her job.  Some decreased stress.  Overall she feels she is handling things relatively well.  Back is better.  Saw chiropractor (dry needles).  Improved.  Able to move better.  Tries to stay active.  No chest pain.  No sob.  No acid reflux.  No abdominal pain.  Bowels moving.  Does report some increased nasal congestion.  Has allergy issues.  Has stopped her allergy shots.  She is taking singulair and xyzal.  Plans to restart dymista and saline on a regular basis.     Past Medical History:  Diagnosis Date  . Anxiety   . Arthritis   . Depression   . Environmental allergies   . Gestational diabetes   . History of chicken pox   . Migraines   . MVP (mitral valve prolapse)   . Seizures ([place])    Past Surgical History:  Procedure Laterality Date  . ABDOMINAL HYSTERECTOMY  02/2011   abnormal pap (stage III dysplasia).   ovaries not removed.  Marland Kitchen abscess removal  2007   cervix   . DILATION AND CURETTAGE OF UTERUS  2002, 2005  . LEEP  2008  . WISDOM TOOTH EXTRACTION  2003   Family History  Problem Relation Age of Onset  . Hypercholesterolemia Mother   . Hypercholesterolemia Father   . Diabetes Paternal Grandfather   . Prostate cancer Paternal Grandfather   . Breast cancer Maternal Aunt        50's  . Colon cancer Neg Hx    Social History   Social History  . Marital status: Divorced    Spouse name: N/A  . Number of children: 2  . Years of education: N/A   Social History Main Topics  . Smoking status: Never Smoker  . Smokeless tobacco: Never Used  . Alcohol use No     Comment: rarely  . Drug use: No  . Sexual activity: Not Asked   Other Topics Concern  . None   Social History Narrative  .  None    Outpatient Encounter Prescriptions as of 03/04/2017  Medication Sig  . acetaminophen (TYLENOL) 500 MG tablet Take 500 mg by mouth as needed.  Marland Kitchen aspirin 325 MG EC tablet Take 325 mg by mouth every 6 (six) hours as needed for pain.  Marland Kitchen escitalopram (LEXAPRO) 10 MG tablet Take 1.5 tablets (15 mg total) by mouth daily.  Marland Kitchen levocetirizine (XYZAL) 5 MG tablet Take 5 mg by mouth every evening.  Marland Kitchen LORazepam (ATIVAN) 0.5 MG tablet 1/2 tablet q day prn  . montelukast (SINGULAIR) 10 MG tablet Take 10 mg by mouth at bedtime.  . phenytoin (DILANTIN) 100 MG ER capsule Take 1 capsule (100 mg total) by mouth 3 (three) times daily.  . SUMAtriptan (IMITREX) 100 MG tablet Take 100 mg by mouth daily as needed for migraine. May repeat in 2 hours if headache persists or recurs.  . [DISCONTINUED] escitalopram (LEXAPRO) 10 MG tablet Take 1.5 tablets (15 mg total) by mouth daily.  . [DISCONTINUED] LORazepam (ATIVAN) 0.5 MG tablet 1/2 tablet q day prn  . [DISCONTINUED] LYRICA 75 MG capsule Take 1 tablet by mouth daily.   No  facility-administered encounter medications on file as of 03/04/2017.     Review of Systems  Constitutional: Negative for appetite change and unexpected weight change.  HENT: Positive for congestion and postnasal drip.   Respiratory: Negative for cough, chest tightness and shortness of breath.   Cardiovascular: Negative for chest pain, palpitations and leg swelling.  Gastrointestinal: Negative for abdominal pain, diarrhea, nausea and vomiting.  Genitourinary: Negative for difficulty urinating and dysuria.  Musculoskeletal: Negative for myalgias.       Back pain is better.    Skin: Negative for color change and rash.  Neurological: Negative for dizziness, light-headedness and headaches.  Psychiatric/Behavioral: Negative for agitation and dysphoric mood.       Handling stress.         Objective:    Physical Exam  Constitutional: She appears well-developed and well-nourished. No  distress.  HENT:  Nose: Nose normal.  Mouth/Throat: Oropharynx is clear and moist.  Neck: Neck supple. No thyromegaly present.  Left neck - anterior cervical fullness (node).    Cardiovascular: Normal rate and regular rhythm.   Pulmonary/Chest: Breath sounds normal. No respiratory distress. She has no wheezes.  Abdominal: Soft. Bowel sounds are normal. There is no tenderness.  Musculoskeletal: She exhibits no edema or tenderness.  Lymphadenopathy:    She has no cervical adenopathy.  Skin: No rash noted. No erythema.  Psychiatric: She has a normal mood and affect. Her behavior is normal.    BP 114/78 (BP Location: Left Arm, Patient Position: Sitting, Cuff Size: Normal)   Pulse 98   Temp 98.9 F (37.2 C) (Oral)   Resp 14   Ht 5\' 6"  (1.676 m)   Wt 153 lb 9.6 oz (69.7 kg)   LMP 02/24/2011   SpO2 98%   BMI 24.79 kg/m  Wt Readings from Last 3 Encounters:  03/04/17 153 lb 9.6 oz (69.7 kg)  10/01/16 152 lb 6.4 oz (69.1 kg)  07/14/16 147 lb (66.7 kg)     Lab Results  Component Value Date   WBC 8.4 06/18/2016   HGB 12.7 06/18/2016   HCT 37.9 06/18/2016   PLT 329 06/18/2016   GLUCOSE 86 10/29/2015   CHOL 177 10/29/2015   TRIG 101.0 10/29/2015   HDL 59.90 10/29/2015   LDLCALC 97 10/29/2015   ALT 17 06/18/2016   AST 11 06/18/2016   NA 141 10/29/2015   K 4.6 10/29/2015   CL 107 10/29/2015   CREATININE 0.71 10/29/2015   BUN 13 10/29/2015   CO2 29 10/29/2015   TSH 2.98 10/29/2015    Dg Bone Density (dxa)  Result Date: 09/20/2016 EXAM: DUAL X-RAY ABSORPTIOMETRY (DXA) FOR BONE MINERAL DENSITY IMPRESSION: Dear Dr Alyssa Bautista, Your patient Alyssa Bautista completed a BMD test on 09/20/2016 using the [place] (analysis version: 14.10) manufactured by [employer]. The following summarizes the results of our evaluation. PATIENT BIOGRAPHICAL: Name: Alyssa, Bautista Patient ID: [id number] Birth Date: 11-16-1970 Height: 66.0 in. Gender: Female Exam Date: 09/20/2016  Weight: 147.0 lbs. Indications: Hysterectomy, Postmenopausal Fractures: Left foot Treatments: Calcium, dilantin, Vitamin D ASSESSMENT: The BMD measured at Femur Neck Right is 1.023 g/cm2 with a T-score of -0.1. This patient is considered normal according to [facility]) criteria. Site Region Measured Measured WHO Young Adult BMD Date       Age      Classification T-score AP Spine L1-L4 09/20/2016 43.6 Normal 1.0 1.316 g/cm2 DualFemur Neck Right 09/20/2016 43.6 Normal -0.1 1.023 g/cm2 World Health Organization [facility])  criteria for post-menopausal, Caucasian Women: Normal:       T-score at or above -1 SD Osteopenia:   T-score between -1 and -2.5 SD Osteoporosis: T-score at or below -2.5 SD RECOMMENDATIONS: [place] recommends that FDA-approved medical therapies be considered in postmenopausal women and men age 61 or older with a: 1. Hip or vertebral (clinical or morphometric) fracture. 2. T-score of < -2.5 at the spine or hip. 3. Ten-year fracture probability by FRAX of 3% or greater for hip fracture or 20% or greater for major osteoporotic fracture. All treatment decisions require clinical judgment and consideration of individual patient factors, including patient preferences, co-morbidities, previous drug use, risk factors not captured in the FRAX model (e.g. falls, vitamin D deficiency, increased bone turnover, interval significant decline in bone density) and possible under - or over-estimation of fracture risk by FRAX. All patients should ensure an adequate intake of dietary calcium (1200 mg/d) and vitamin D (800 IU daily) unless contraindicated. FOLLOW-UP: People with diagnosed cases of osteoporosis or at high risk for fracture should have regular bone mineral density tests. For patients eligible for Medicare, routine testing is allowed once every 2 years. The testing frequency can be increased to one year for patients who have rapidly progressing disease, those who are  receiving or discontinuing medical therapy to restore bone mass, or have additional risk factors. I have reviewed this report, and agree with the above findings. [facility] Radiology Electronically Signed   By: Nolon Nations M.D.   On: 09/20/2016 15:30       Assessment & Plan:   Problem List Items Addressed This Visit    Environmental allergies    Continue singulair and xyzal.  Restart saline nasal spray and dymista as directed.  She plans to call her allergist and restart allergy shots.  Follow.        Migraine headache    Improved.  Follow.        Relevant Medications   escitalopram (LEXAPRO) 10 MG tablet   Neck nodule    Left neck nodule.  May be related to increased allergies/sinus issues.  She has not noticed.  Will follow.  Call with update over the next 1-2 weeks.  Treat allergies.        Relevant Orders   CBC with Differential/Platelet   TSH   Seizure disorder ([place])    Stable. Follow.        Relevant Orders   Hepatic function panel   Basic metabolic panel   Dilantin (Phenytoin) level, total   Stress    On lexapro.  Overall stable.  Feels she is handling things well.  Follow.        Vitamin D deficiency    Follow vitamin D level.        Relevant Orders   VITAMIN D 25 Hydroxy (Vit-D Deficiency, Fractures)    Other Visit Diagnoses    Screening cholesterol level    -  Primary   Relevant Orders   Lipid panel       Einar Pheasant, MD

## 2017-03-06 ENCOUNTER — Encounter: Payer: Self-pay | Admitting: Internal Medicine

## 2017-03-06 DIAGNOSIS — R221 Localized swelling, mass and lump, neck: Secondary | ICD-10-CM | POA: Insufficient documentation

## 2017-03-06 DIAGNOSIS — Z9109 Other allergy status, other than to drugs and biological substances: Secondary | ICD-10-CM | POA: Insufficient documentation

## 2017-03-06 NOTE — Assessment & Plan Note (Signed)
Continue singulair and xyzal.  Restart saline nasal spray and dymista as directed.  She plans to call her allergist and restart allergy shots.  Follow.

## 2017-03-06 NOTE — Assessment & Plan Note (Signed)
Stable.  Follow.   

## 2017-03-06 NOTE — Assessment & Plan Note (Signed)
Follow vitamin D level.  

## 2017-03-06 NOTE — Assessment & Plan Note (Signed)
Improved.  Follow.  

## 2017-03-06 NOTE — Assessment & Plan Note (Signed)
On lexapro.  Overall stable.  Feels she is handling things well.  Follow.

## 2017-03-06 NOTE — Assessment & Plan Note (Signed)
Left neck nodule.  May be related to increased allergies/sinus issues.  She has not noticed.  Will follow.  Call with update over the next 1-2 weeks.  Treat allergies.

## 2017-03-10 ENCOUNTER — Encounter: Payer: Self-pay | Admitting: Internal Medicine

## 2017-03-11 NOTE — Telephone Encounter (Signed)
Called patient states that she will [facility] to [facility]. If any problems she will give our office a call.

## 2017-03-11 NOTE — Telephone Encounter (Signed)
Please call pt and notify her that I do not mind calling in something for cough (i.e., tessalon perles), but given her symptoms - I feel she needs to be reevaluated.  She had some allergy symptoms when I saw her, but it appears that symptoms are progressing and changing and I feel she needs to be seen.  If no openings here, then acute care and we can f/u after.

## 2017-06-14 ENCOUNTER — Other Ambulatory Visit: Payer: Self-pay | Admitting: Neurology

## 2017-06-14 DIAGNOSIS — G40109 Localization-related (focal) (partial) symptomatic epilepsy and epileptic syndromes with simple partial seizures, not intractable, without status epilepticus: Secondary | ICD-10-CM

## 2017-06-15 ENCOUNTER — Other Ambulatory Visit: Payer: Self-pay

## 2017-06-15 ENCOUNTER — Telehealth: Payer: Self-pay | Admitting: Cardiovascular Disease

## 2017-06-15 DIAGNOSIS — R55 Syncope and collapse: Secondary | ICD-10-CM

## 2017-06-15 NOTE — Telephone Encounter (Signed)
Patient called back and s/w scheduling.  She will have zio placed tomorrow at 9:30am

## 2017-06-15 NOTE — Telephone Encounter (Signed)
Patient was told by Dr. Manuella Ghazi to be seen sooner for pre sncope 2 weeks ago.  Patient declines appt with APP staff.  Patient scheduled in April Please advise

## 2017-06-15 NOTE — Telephone Encounter (Signed)
Let's do a 2 week Zio patch.

## 2017-06-15 NOTE — Telephone Encounter (Signed)
Left message on machine for patient to contact the office.  Order placed.

## 2017-06-15 NOTE — Telephone Encounter (Signed)
Per Dr. Trena Platt notes:  "2. Episode of presyncope - -Will order MRI Brain with and without contrast. - Will not order EEG as she has had one in 2017. -Recommended to contact cardiologist, Dr. Fletcher Anon, concerning this episode and ask to bee seen sooner. Would recommend to have Holter monitor performed. "  Added to schedule 2/22, 3:20. Pt aware and agreeable. Will route to Dr. Fletcher Anon for monitor consideration before her appt

## 2017-06-16 ENCOUNTER — Ambulatory Visit (INDEPENDENT_AMBULATORY_CARE_PROVIDER_SITE_OTHER): Payer: Medicaid Other

## 2017-06-16 DIAGNOSIS — R55 Syncope and collapse: Secondary | ICD-10-CM | POA: Diagnosis not present

## 2017-06-16 NOTE — Telephone Encounter (Addendum)
Zio patch [id number] placed on patient Registered at [email or web address]

## 2017-06-21 ENCOUNTER — Other Ambulatory Visit: Payer: Self-pay

## 2017-06-21 ENCOUNTER — Telehealth: Payer: Self-pay | Admitting: Cardiovascular Disease

## 2017-06-21 ENCOUNTER — Ambulatory Visit: Payer: Medicaid Other

## 2017-06-21 ENCOUNTER — Ambulatory Visit
Admission: RE | Admit: 2017-06-21 | Discharge: 2017-06-21 | Disposition: A | Discharge status: Home / Self Care | Payer: Medicaid Other | Source: Ambulatory Visit | Attending: Neurology | Admitting: Neurology

## 2017-06-21 DIAGNOSIS — R55 Syncope and collapse: Principal | ICD-10-CM

## 2017-06-21 DIAGNOSIS — G40209 Localization-related (focal) (partial) symptomatic epilepsy and epileptic syndromes with complex partial seizures, not intractable, without status epilepticus: Secondary | ICD-10-CM | POA: Insufficient documentation

## 2017-06-21 DIAGNOSIS — R42 Dizziness and giddiness: Secondary | ICD-10-CM

## 2017-06-21 DIAGNOSIS — G40109 Localization-related (focal) (partial) symptomatic epilepsy and epileptic syndromes with simple partial seizures, not intractable, without status epilepticus: Secondary | ICD-10-CM

## 2017-06-21 MED ORDER — GADOBENATE DIMEGLUMINE 529 MG/ML IV SOLN
15.0000 mL | Freq: Once | INTRAVENOUS | Status: AC | PRN
  Administered 2017-06-21: 14 mL via INTRAVENOUS

## 2017-06-21 NOTE — Telephone Encounter (Signed)
Pt scheduled to have another monitor placed today and will mail back one placed on 06/16/17. Added to nurse visit.  Zio monitor [id number]

## 2017-06-21 NOTE — Telephone Encounter (Signed)
Pt states she had a ZIO monitor placed last week, states she had an MRI yesterday and had to take it off. Please call to discuss putting it back on.

## 2017-06-28 ENCOUNTER — Ambulatory Visit (INDEPENDENT_AMBULATORY_CARE_PROVIDER_SITE_OTHER): Payer: Medicaid Other | Admitting: Cardiovascular Disease

## 2017-06-28 VITALS — BP 122/66 | HR 87 | Ht 66.0 in | Wt 154.0 lb

## 2017-06-28 DIAGNOSIS — I059 Rheumatic mitral valve disease, unspecified: Secondary | ICD-10-CM | POA: Diagnosis not present

## 2017-06-28 DIAGNOSIS — R55 Syncope and collapse: Secondary | ICD-10-CM

## 2017-06-28 NOTE — Progress Notes (Signed)
Cardiology Office Note  Date:  06/28/2017   ID:  Alyssa Bautista, DOB 16-Aug-1970, MRN [id number]  PCP:  Einar Pheasant, MD  Cardiologist:   Kathlyn Sacramento, MD   Chief Complaint  Patient presents with  . Other    12 month follow up. Patient states sometimes she has a funny feeling in her chest but would not call it pain.  Patient is currently wearing the ZIO. Meds reviewed verbally with patient.       History of Present Illness: Alyssa Bautista is a 47 y.o. female who presents for a follow-up visit regarding mitral valve prolapse with moderate mitral regurgitation. She has chronic medical conditions that include migraine headaches, seizures, depression and abnormal pap smear requiring hysterectomy.  She was told about a cardiac murmur and she was 47 years old.  Most recent echocardiogram in 2018 showed normal LV systolic function, mild to moderate anterior leaflet prolapse with moderate mitral regurgitation, mildly dilated left atrium with normal pulmonary pressure.   Recently, she had an episode of presyncope while she was in the car.  She had a hot feeling followed by seeing dark spots.  She did not have full loss of consciousness and the episode lasted only for a few seconds.  She saw Dr. Manuella Ghazi who did not think it was a seizure.  MRI of the brain was unremarkable. Due to this episode, outpatient monitor was recommended and she is currently wearing a Zio patch.   No recent worsening of shortness of breath.  She has occasional sharp left-sided chest pain but not very frequent.  Past Medical History:  Diagnosis Date  . Anxiety   . Arthritis   . Depression   . Environmental allergies   . Gestational diabetes   . History of chicken pox   . Migraines   . MVP (mitral valve prolapse)   . Seizures ([place])     Past Surgical History:  Procedure Laterality Date  . ABDOMINAL HYSTERECTOMY  02/2011   abnormal pap (stage III dysplasia).   ovaries not removed.  Marland Kitchen abscess  removal  2007   cervix   . DILATION AND CURETTAGE OF UTERUS  2002, 2005  . LEEP  2008  . WISDOM TOOTH EXTRACTION  2003     Current Outpatient Medications  Medication Sig Dispense Refill  . acetaminophen (TYLENOL) 500 MG tablet Take 500 mg by mouth as needed.    Marland Kitchen aspirin 325 MG EC tablet Take 325 mg by mouth every 6 (six) hours as needed for pain.    Marland Kitchen escitalopram (LEXAPRO) 10 MG tablet Take 1.5 tablets (15 mg total) by mouth daily. 45 tablet 3  . levocetirizine (XYZAL) 5 MG tablet Take 5 mg by mouth every evening.    Marland Kitchen LORazepam (ATIVAN) 0.5 MG tablet 1/2 tablet q day prn 30 tablet 0  . montelukast (SINGULAIR) 10 MG tablet Take 10 mg by mouth at bedtime.    . phenytoin (DILANTIN) 100 MG ER capsule Take 1 capsule (100 mg total) by mouth 3 (three) times daily. 90 capsule 0  . SUMAtriptan (IMITREX) 100 MG tablet Take 100 mg by mouth daily as needed for migraine. May repeat in 2 hours if headache persists or recurs.     No current facility-administered medications for this visit.     Allergies:   Penicillins; Azithromycin; Dilantin [phenytoin]; and Clindamycin/lincomycin    Social History:  The patient  reports that  has never smoked. she has never used smokeless tobacco. She reports that  she does not drink alcohol or use drugs.   Family History:  The patient's family history includes Breast cancer in her maternal aunt; Diabetes in her paternal grandfather; Hypercholesterolemia in her father and mother; Prostate cancer in her paternal grandfather.    ROS:  Please see the history of present illness.   Otherwise, review of systems are positive for none.   All other systems are reviewed and negative.    PHYSICAL EXAM: VS:  BP 122/66 (BP Location: Left Arm, Patient Position: Sitting, Cuff Size: Normal)   Pulse 87   Ht 5\' 6"  (1.676 m)   Wt 154 lb (69.9 kg)   LMP 02/24/2011   BMI 24.86 kg/m  , BMI Body mass index is 24.86 kg/m. GEN: Well nourished, well developed, in no acute  distress  HEENT: normal  Neck: no JVD, carotid bruits, or masses Cardiac: RRR; no  rubs, or gallops,no edema . 3/6 holosystolic murmur at the apex radiating to the left sternal border. Respiratory:  clear to auscultation bilaterally, normal work of breathing GI: soft, nontender, nondistended, + BS MS: no deformity or atrophy  Skin: warm and dry, no rash Neuro:  Strength and sensation are intact Psych: euthymic mood, full affect   EKG:  EKG is ordered today. The ekg ordered today demonstrates  normal sinus rhythm with nonspecific T wave changes.   Recent Labs: No results found for requested labs within last 8760 hours.    Lipid Panel    Component Value Date/Time   CHOL 177 10/29/2015 0802   TRIG 101.0 10/29/2015 0802   HDL 59.90 10/29/2015 0802   CHOLHDL 3 10/29/2015 0802   VLDL 20.2 10/29/2015 0802   LDLCALC 97 10/29/2015 0802      Wt Readings from Last 3 Encounters:  06/28/17 154 lb (69.9 kg)  03/04/17 153 lb 9.6 oz (69.7 kg)  10/01/16 152 lb 6.4 oz (69.1 kg)       No flowsheet data found.    ASSESSMENT AND PLAN:  1.  Mitral valve prolapse with moderate mitral regurgitation: I requested a follow-up echocardiogram.  I do not think she is symptomatic at the present time.  2.  Recent presyncope: Based on the description, I suspect that the episode was vasovagal in nature.  She is wearing a 2-week outpatient monitor to exclude arrhythmia given known history of mitral valve disease.   Disposition:   FU with me in 1 year  Signed,  Kathlyn Sacramento, MD  06/28/2017 4:48 PM    [place]

## 2017-06-28 NOTE — Patient Instructions (Signed)
Medication Instructions: - Your physician recommends that you continue on your current medications as directed. Please refer to the Current Medication list given to you today.  Labwork: - none ordered  Procedures/Testing: - Your physician has requested that you have an echocardiogram. Echocardiography is a painless test that uses sound waves to create images of your heart. It provides your doctor with information about the size and shape of your heart and how well your heart's chambers and valves are working. This procedure takes approximately one hour. There are no restrictions for this procedure.  Follow-Up: - Your physician wants you to follow-up in: 1 year with Dr. Fletcher Anon.  You will receive a reminder letter in the mail two months in advance. If you don't receive a letter, please call our office to schedule the follow-up appointment.   Any Additional Special Instructions Will Be Listed Below (If Applicable).     If you need a refill on your cardiac medications before your next appointment, please call your pharmacy.

## 2017-06-30 ENCOUNTER — Telehealth: Payer: Self-pay | Admitting: Cardiovascular Disease

## 2017-06-30 NOTE — Telephone Encounter (Signed)
Patient called back. Advised patient to call ZIO to let them know about the irritation and redness. ZIO wants this reported to them as well as they will offer advice as to whether patient should go ahead and send monitor back or reattach. Patient appreciative and verbalized understanding.

## 2017-06-30 NOTE — Telephone Encounter (Signed)
Left message to call back  

## 2017-06-30 NOTE — Telephone Encounter (Signed)
Pt states she is wearing a 14 day ZIO , and tomorrow is the last day, she states one of the leads has came off and her skin is red and irritated and itchy. Please call to discuss.

## 2017-06-30 NOTE — Telephone Encounter (Signed)
Patient returning call.

## 2017-06-30 NOTE — Telephone Encounter (Signed)
No answer. Left message to call back.   

## 2017-07-01 ENCOUNTER — Ambulatory Visit: Payer: Medicaid Other | Admitting: Cardiovascular Disease

## 2017-07-07 ENCOUNTER — Other Ambulatory Visit: Payer: Medicaid Other

## 2017-07-13 ENCOUNTER — Ambulatory Visit (INDEPENDENT_AMBULATORY_CARE_PROVIDER_SITE_OTHER): Payer: Medicaid Other

## 2017-07-13 ENCOUNTER — Other Ambulatory Visit: Payer: Self-pay

## 2017-07-13 DIAGNOSIS — I059 Rheumatic mitral valve disease, unspecified: Secondary | ICD-10-CM

## 2017-07-15 ENCOUNTER — Other Ambulatory Visit (HOSPITAL_COMMUNITY)
Admission: RE | Admit: 2017-07-15 | Discharge: 2017-07-15 | Disposition: A | Discharge status: Home / Self Care | Payer: Medicaid Other | Source: Ambulatory Visit | Attending: Internal Medicine | Admitting: Internal Medicine

## 2017-07-15 ENCOUNTER — Ambulatory Visit (INDEPENDENT_AMBULATORY_CARE_PROVIDER_SITE_OTHER): Payer: Medicaid Other | Admitting: Internal Medicine

## 2017-07-15 VITALS — BP 124/78 | HR 100 | Temp 98.7°F | Resp 18 | Wt 153.2 lb

## 2017-07-15 DIAGNOSIS — F329 Major depressive disorder, single episode, unspecified: Secondary | ICD-10-CM

## 2017-07-15 DIAGNOSIS — F32A Depression, unspecified: Secondary | ICD-10-CM

## 2017-07-15 DIAGNOSIS — F439 Reaction to severe stress, unspecified: Secondary | ICD-10-CM

## 2017-07-15 DIAGNOSIS — Z124 Encounter for screening for malignant neoplasm of cervix: Secondary | ICD-10-CM | POA: Insufficient documentation

## 2017-07-15 DIAGNOSIS — G40909 Epilepsy, unspecified, not intractable, without status epilepticus: Secondary | ICD-10-CM | POA: Diagnosis not present

## 2017-07-15 DIAGNOSIS — Z0000 Encounter for general adult medical examination without abnormal findings: Secondary | ICD-10-CM

## 2017-07-15 DIAGNOSIS — Z1239 Encounter for other screening for malignant neoplasm of breast: Secondary | ICD-10-CM

## 2017-07-15 DIAGNOSIS — G43809 Other migraine, not intractable, without status migrainosus: Secondary | ICD-10-CM | POA: Diagnosis not present

## 2017-07-15 DIAGNOSIS — Z1231 Encounter for screening mammogram for malignant neoplasm of breast: Secondary | ICD-10-CM | POA: Diagnosis not present

## 2017-07-15 MED ORDER — ESCITALOPRAM OXALATE 10 MG PO TABS: 15.0000 mg | ORAL_TABLET | Freq: Every day | ORAL | 3 refills | Status: DC

## 2017-07-15 MED ORDER — LORAZEPAM 0.5 MG PO TABS: ORAL_TABLET | ORAL | 0 refills | Status: DC

## 2017-07-15 NOTE — Progress Notes (Signed)
Patient ID: Alyssa Bautista, female   DOB: Sep 22, 1970, 47 y.o.   MRN: [id number]   Subjective:    Patient ID: Alyssa Bautista, female    DOB: March 14, 1971, 47 y.o.   MRN: [id number]  HPI  Patient here for her physical exam.  She reports increased stress.  Discussed with her today.  Stress mostly related to her mother.  Also with increased stress regarding some oer her health issues, specifically macular degeneration ,etc.  She is planning to start a new job next week.  Discussed with her today.  Only taking one citalopram daily now.  Discussed increasing the dose.  She is still seeing her counselor.  Eating and drinking.  No nausea or vomiting.  Bowels moving.  Recently evaluated for pre syncope.  Occurred while in the car.  Described a hot felling and then started seeing dark spots.  No LOC.  Last a few seconds.  Saw Dr Manuella Ghazi.  Did not feel was a seizure.  MRI brain - unremarkable.  Wore zio patch.  Saw cardiology.  Felt to be vasovagal response.   Has not had any further episodes.  Does report some decreased energy.     Past Medical History:  Diagnosis Date  . Anxiety   . Arthritis   . Depression   . Environmental allergies   . Gestational diabetes   . History of chicken pox   . Migraines   . MVP (mitral valve prolapse)   . Seizures ([place])    Past Surgical History:  Procedure Laterality Date  . ABDOMINAL HYSTERECTOMY  02/2011   abnormal pap (stage III dysplasia).   ovaries not removed.  Marland Kitchen abscess removal  2007   cervix   . DILATION AND CURETTAGE OF UTERUS  2002, 2005  . LEEP  2008  . WISDOM TOOTH EXTRACTION  2003   Family History  Problem Relation Age of Onset  . Hypercholesterolemia Mother   . Hypercholesterolemia Father   . Diabetes Paternal Grandfather   . Prostate cancer Paternal Grandfather   . Breast cancer Maternal Aunt        50's  . Colon cancer Neg Hx    Social History   Socioeconomic History  . Marital status: Divorced    Spouse name: None  . Number  of children: 2  . Years of education: None  . Highest education level: None  Social Needs  . Financial resource strain: None  . Food insecurity - worry: None  . Food insecurity - inability: None  . Transportation needs - medical: None  . Transportation needs - non-medical: None  Occupational History  . None  Tobacco Use  . Smoking status: Never Smoker  . Smokeless tobacco: Never Used  Substance and Sexual Activity  . Alcohol use: No    Alcohol/week: 0.0 oz    Comment: rarely  . Drug use: No  . Sexual activity: None  Other Topics Concern  . None  Social History Narrative  . None    Outpatient Encounter Medications as of 07/15/2017  Medication Sig  . acetaminophen (TYLENOL) 500 MG tablet Take 500 mg by mouth as needed.  Marland Kitchen aspirin 325 MG EC tablet Take 325 mg by mouth every 6 (six) hours as needed for pain.  Marland Kitchen escitalopram (LEXAPRO) 10 MG tablet Take 1.5 tablets (15 mg total) by mouth daily.  Marland Kitchen levocetirizine (XYZAL) 5 MG tablet Take 5 mg by mouth every evening.  Marland Kitchen LORazepam (ATIVAN) 0.5 MG tablet 1/2 tablet q day prn  .  montelukast (SINGULAIR) 10 MG tablet Take 10 mg by mouth at bedtime.  . phenytoin (DILANTIN) 100 MG ER capsule Take 1 capsule (100 mg total) by mouth 3 (three) times daily.  . SUMAtriptan (IMITREX) 100 MG tablet Take 100 mg by mouth daily as needed for migraine. May repeat in 2 hours if headache persists or recurs.  . [DISCONTINUED] escitalopram (LEXAPRO) 10 MG tablet Take 1.5 tablets (15 mg total) by mouth daily.  . [DISCONTINUED] LORazepam (ATIVAN) 0.5 MG tablet 1/2 tablet q day prn   No facility-administered encounter medications on file as of 07/15/2017.     Review of Systems  Constitutional: Positive for fatigue. Negative for appetite change.  HENT: Negative for congestion and sinus pressure.   Eyes: Negative for pain and visual disturbance.  Respiratory: Negative for cough, chest tightness and shortness of breath.   Cardiovascular: Negative for chest  pain, palpitations and leg swelling.  Gastrointestinal: Negative for abdominal pain, diarrhea, nausea and vomiting.  Genitourinary: Negative for difficulty urinating and dysuria.  Musculoskeletal: Negative for joint swelling and myalgias.  Skin: Negative for color change and rash.  Neurological: Negative for dizziness, light-headedness and headaches.  Hematological: Negative for adenopathy. Does not bruise/bleed easily.  Psychiatric/Behavioral: Negative for agitation and dysphoric mood.       Objective:    Physical Exam  Constitutional: She is oriented to person, place, and time. She appears well-developed and well-nourished. No distress.  HENT:  Nose: Nose normal.  Mouth/Throat: Oropharynx is clear and moist.  Eyes: Right eye exhibits no discharge. Left eye exhibits no discharge. No scleral icterus.  Neck: Neck supple. No thyromegaly present.  Cardiovascular: Normal rate and regular rhythm.  Pulmonary/Chest: Breath sounds normal. No accessory muscle usage. No tachypnea. No respiratory distress. She has no decreased breath sounds. She has no wheezes. She has no rhonchi. Right breast exhibits no inverted nipple, no mass, no nipple discharge and no tenderness (no axillary adenopathy). Left breast exhibits no inverted nipple, no mass, no nipple discharge and no tenderness (no axilarry adenopathy).  Abdominal: Soft. Bowel sounds are normal. There is no tenderness.  Genitourinary:  Genitourinary Comments: Normal external genitalia.  Vaginal vault without lesions.  Cervix identified.  Pap smear performed.  Could not appreciate any adnexal masses or tenderness.    Musculoskeletal: She exhibits no edema or tenderness.  Lymphadenopathy:    She has no cervical adenopathy.  Neurological: She is alert and oriented to person, place, and time.  Skin: Skin is warm. No rash noted. No erythema.  Psychiatric: She has a normal mood and affect. Her behavior is normal.    BP 124/78 (BP Location: Left  Arm, Patient Position: Sitting, Cuff Size: Normal)   Pulse 100   Temp 98.7 F (37.1 C) (Oral)   Resp 18   Wt 153 lb 3.2 oz (69.5 kg)   LMP 02/24/2011   SpO2 97%   BMI 24.73 kg/m  Wt Readings from Last 3 Encounters:  07/15/17 153 lb 3.2 oz (69.5 kg)  06/28/17 154 lb (69.9 kg)  03/04/17 153 lb 9.6 oz (69.7 kg)     Lab Results  Component Value Date   WBC 8.4 06/18/2016   HGB 12.7 06/18/2016   HCT 37.9 06/18/2016   PLT 329 06/18/2016   GLUCOSE 86 10/29/2015   CHOL 177 10/29/2015   TRIG 101.0 10/29/2015   HDL 59.90 10/29/2015   LDLCALC 97 10/29/2015   ALT 17 06/18/2016   AST 11 06/18/2016   NA 141 10/29/2015   K 4.6  10/29/2015   CL 107 10/29/2015   CREATININE 0.71 10/29/2015   BUN 13 10/29/2015   CO2 29 10/29/2015   TSH 2.98 10/29/2015    Mr Brain W ZS Contrast  Result Date: 06/21/2017 CLINICAL DATA:  Temporal lobe seizure. Presyncopal episode 3 weeks ago. Subjective right eyelid weakness. EXAM: MRI HEAD WITHOUT AND WITH CONTRAST TECHNIQUE: Multiplanar, multiecho pulse sequences of the brain and surrounding structures were obtained without and with intravenous contrast. CONTRAST:  47mL MULTIHANCE GADOBENATE DIMEGLUMINE 529 MG/ML IV SOLN COMPARISON:  None. FINDINGS: Brain: Dedicated thin section imaging through the temporal lobes demonstrates normal volume and signal of the hippocampi. There is no evidence of heterotopia or cortical dysplasia. There is no evidence of acute infarct, intracranial hemorrhage, mass, midline shift, or extra-axial fluid collection. The ventricles and sulci are normal. The brain is normal in signal. No abnormal enhancement is identified. Vascular: Major intracranial vascular flow voids are preserved. Skull and upper cervical spine: Unremarkable bone marrow signal. Sinuses/Orbits: Unremarkable orbits. Single opacified posterior right ethmoid air cell. Clear mastoid air cells. Other: Partially visualized, mildly prominent bilateral upper anterior cervical  lymph nodes measuring up to 10 mm in short axis, nonspecific. IMPRESSION: Unremarkable appearance of the brain. Electronically Signed   By: Logan Bores M.D.   On: 06/21/2017 10:28       Assessment & Plan:   Problem List Items Addressed This Visit    Depression    Increased stress as outlined.  Discussed with her today.  She is seeing her counselor.  Increase lexapro back up to 15mg  q day.  Follow.  Get her back in soon to reassess.        Relevant Medications   escitalopram (LEXAPRO) 10 MG tablet   LORazepam (ATIVAN) 0.5 MG tablet   Health care maintenance    Physical today 07/15/17.  Mammogram 2.27.18 - [facility].  Schedule for f/u mammogram.   She will schedule.  PAP 07/15/17.        Migraine headache    Stable.       Relevant Medications   escitalopram (LEXAPRO) 10 MG tablet   Seizure disorder ([facility])    Stable.  Sees neurology.  On dilantin.        Stress    Increased stress as outlined.  Keep f/u appts with her counselor.  Increase lexapro to 15mg  q day.  Follow.       Other Visit Diagnoses    Routine general medical examination at a health care facility    -  Primary   Screening for breast cancer       Relevant Orders   MM DIGITAL SCREENING BILATERAL   Screening for cervical cancer       Relevant Orders   Cytology - PAP       Einar Pheasant, MD

## 2017-07-17 ENCOUNTER — Encounter: Payer: Self-pay | Admitting: Internal Medicine

## 2017-07-17 NOTE — Assessment & Plan Note (Signed)
Stable

## 2017-07-17 NOTE — Assessment & Plan Note (Signed)
Increased stress as outlined.  Keep f/u appts with her counselor.  Increase lexapro to 15mg  q day.  Follow.

## 2017-07-17 NOTE — Assessment & Plan Note (Signed)
Stable.  Sees neurology.  On dilantin.

## 2017-07-17 NOTE — Assessment & Plan Note (Signed)
Increased stress as outlined.  Discussed with her today.  She is seeing her counselor.  Increase lexapro back up to 15mg  q day.  Follow.  Get her back in soon to reassess.

## 2017-07-17 NOTE — Assessment & Plan Note (Signed)
Physical today 07/15/17.  Mammogram 2.27.18 - [facility].  Schedule for f/u mammogram.   She will schedule.  PAP 07/15/17.

## 2017-07-18 ENCOUNTER — Encounter: Payer: Self-pay | Admitting: Internal Medicine

## 2017-07-18 LAB — CYTOLOGY - PAP
Diagnosis: NEGATIVE
HPV: NOT DETECTED

## 2017-08-09 ENCOUNTER — Ambulatory Visit: Payer: Medicaid Other | Admitting: Cardiovascular Disease

## 2017-10-20 ENCOUNTER — Ambulatory Visit (INDEPENDENT_AMBULATORY_CARE_PROVIDER_SITE_OTHER): Payer: Medicaid Other | Admitting: Internal Medicine

## 2017-10-20 ENCOUNTER — Encounter: Payer: Self-pay | Admitting: Internal Medicine

## 2017-10-20 DIAGNOSIS — G43809 Other migraine, not intractable, without status migrainosus: Secondary | ICD-10-CM | POA: Diagnosis not present

## 2017-10-20 DIAGNOSIS — F439 Reaction to severe stress, unspecified: Secondary | ICD-10-CM | POA: Diagnosis not present

## 2017-10-20 DIAGNOSIS — F329 Major depressive disorder, single episode, unspecified: Secondary | ICD-10-CM

## 2017-10-20 DIAGNOSIS — Z13220 Encounter for screening for lipoid disorders: Secondary | ICD-10-CM

## 2017-10-20 DIAGNOSIS — E538 Deficiency of other specified B group vitamins: Secondary | ICD-10-CM | POA: Insufficient documentation

## 2017-10-20 DIAGNOSIS — G40909 Epilepsy, unspecified, not intractable, without status epilepticus: Secondary | ICD-10-CM | POA: Diagnosis not present

## 2017-10-20 DIAGNOSIS — Z9109 Other allergy status, other than to drugs and biological substances: Secondary | ICD-10-CM

## 2017-10-20 DIAGNOSIS — E559 Vitamin D deficiency, unspecified: Secondary | ICD-10-CM | POA: Diagnosis not present

## 2017-10-20 DIAGNOSIS — F32A Depression, unspecified: Secondary | ICD-10-CM

## 2017-10-20 DIAGNOSIS — R221 Localized swelling, mass and lump, neck: Secondary | ICD-10-CM

## 2017-10-20 LAB — CBC WITH DIFFERENTIAL/PLATELET
BASOS ABS: 0 10*3/uL (ref 0.0–0.1)
BASOS PCT: 0.5 % (ref 0.0–3.0)
EOS PCT: 5.7 % — AB (ref 0.0–5.0)
Eosinophils Absolute: 0.3 10*3/uL (ref 0.0–0.7)
HEMATOCRIT: 39.2 % (ref 36.0–46.0)
Hemoglobin: 13.3 g/dL (ref 12.0–15.0)
LYMPHS PCT: 29.4 % (ref 12.0–46.0)
Lymphs Abs: 1.4 10*3/uL (ref 0.7–4.0)
MCHC: 34.1 g/dL (ref 30.0–36.0)
MCV: 97.9 fl (ref 78.0–100.0)
MONOS PCT: 6.3 % (ref 3.0–12.0)
Monocytes Absolute: 0.3 10*3/uL (ref 0.1–1.0)
NEUTROS ABS: 2.8 10*3/uL (ref 1.4–7.7)
Neutrophils Relative %: 58.1 % (ref 43.0–77.0)
PLATELETS: 308 10*3/uL (ref 150.0–400.0)
RBC: 4 Mil/uL (ref 3.87–5.11)
RDW: 13.9 % (ref 11.5–15.5)
WBC: 4.8 10*3/uL (ref 4.0–10.5)

## 2017-10-20 LAB — LIPID PANEL
CHOLESTEROL: 194 mg/dL (ref 0–200)
HDL: 67.4 mg/dL (ref >39.00)
LDL Cholesterol: 107 mg/dL — ABNORMAL HIGH (ref 0–99)
NonHDL: 126.52
Total CHOL/HDL Ratio: 3
Triglycerides: 98 mg/dL (ref 0.0–149.0)
VLDL: 19.6 mg/dL (ref 0.0–40.0)

## 2017-10-20 LAB — VITAMIN D 25 HYDROXY (VIT D DEFICIENCY, FRACTURES): VITD: 9.02 ng/mL — ABNORMAL LOW (ref 30.00–100.00)

## 2017-10-20 LAB — HEPATIC FUNCTION PANEL
ALBUMIN: 4.2 g/dL (ref 3.5–5.2)
ALT: 16 U/L (ref 0–35)
AST: 13 U/L (ref 0–37)
Alkaline Phosphatase: 90 U/L (ref 39–117)
Bilirubin, Direct: 0.1 mg/dL (ref 0.0–0.3)
TOTAL PROTEIN: 7.4 g/dL (ref 6.0–8.3)
Total Bilirubin: 0.4 mg/dL (ref 0.2–1.2)

## 2017-10-20 LAB — BASIC METABOLIC PANEL
BUN: 7 mg/dL (ref 6–23)
CALCIUM: 9 mg/dL (ref 8.4–10.5)
CO2: 31 meq/L (ref 19–32)
CREATININE: 0.8 mg/dL (ref 0.40–1.20)
Chloride: 104 mEq/L (ref 96–112)
GFR: 82.55 mL/min (ref >60.00)
GLUCOSE: 94 mg/dL (ref 70–99)
Potassium: 3.7 mEq/L (ref 3.5–5.1)
Sodium: 141 mEq/L (ref 135–145)

## 2017-10-20 LAB — TSH: TSH: 1.92 u[IU]/mL (ref 0.35–4.50)

## 2017-10-20 LAB — VITAMIN B12: Vitamin B-12: 212 pg/mL (ref 211–911)

## 2017-10-20 NOTE — Progress Notes (Signed)
Patient ID: [name], female   DOB: 1970-07-19, 47 y.o.   MRN: [id number]   Subjective:    Patient ID: [name], female    DOB: 02/09/1971, 47 y.o.   MRN: [id number]  HPI  Patient here for a scheduled follow up. Last evaluated by neurology 06/2017.  MRI negative.  Recommended continuing dilantin.  Overall stable.  No syncope or near syncopal episodes.  No seizures.  Tries to stay active.  No chest pain.  No sob.  No acid reflux reported.  No abdominal pain.  Bowels moving.  Increased stress with her mother's medical issues.  Discussed with her today.  Seeing a Social worker.  This is helping significantly.  Does not feel she needs anything more at this time.  Remains on lexapro.  Does report noticing she gets winded with exertion - at times.  Does not feel this is anything different or acute.  Discussed further w/up and evaluation, including cardiology evaluation, ekg, etc.  She declines.     Past Medical History:  Diagnosis Date  . Anxiety   . Arthritis   . Depression   . Environmental allergies   . Gestational diabetes   . History of chicken pox   . Migraines   . MVP (mitral valve prolapse)   . Seizures ([place])    Past Surgical History:  Procedure Laterality Date  . ABDOMINAL HYSTERECTOMY  02/2011   abnormal pap (stage III dysplasia).   ovaries not removed.  Marland Kitchen abscess removal  2007   cervix   . DILATION AND CURETTAGE OF UTERUS  2002, 2005  . LEEP  2008  . WISDOM TOOTH EXTRACTION  2003   Family History  Problem Relation Age of Onset  . Hypercholesterolemia Mother   . Hypercholesterolemia Father   . Diabetes Paternal Grandfather   . Prostate cancer Paternal Grandfather   . Breast cancer Maternal Aunt        50's  . Colon cancer Neg Hx    Social History   Socioeconomic History  . Marital status: Divorced    Spouse name: Not on file  . Number of children: 2  . Years of education: Not on file  . Highest education level: Not on file  Occupational  History  . Not on file  Social Needs  . Financial resource strain: Not on file  . Food insecurity:    Worry: Not on file    Inability: Not on file  . Transportation needs:    Medical: Not on file    Non-medical: Not on file  Tobacco Use  . Smoking status: Never Smoker  . Smokeless tobacco: Never Used  Substance and Sexual Activity  . Alcohol use: No    Alcohol/week: 0.0 oz    Comment: rarely  . Drug use: No  . Sexual activity: Not on file  Lifestyle  . Physical activity:    Days per week: Not on file    Minutes per session: Not on file  . Stress: Not on file  Relationships  . Social connections:    Talks on phone: Not on file    Gets together: Not on file    Attends religious service: Not on file    Active member of club or organization: Not on file    Attends meetings of clubs or organizations: Not on file    Relationship status: Not on file  Other Topics Concern  . Not on file  Social History Narrative  . Not on file  Outpatient Encounter Medications as of 10/20/2017  Medication Sig  . acetaminophen (TYLENOL) 500 MG tablet Take 500 mg by mouth as needed.  Marland Kitchen aspirin 325 MG EC tablet Take 325 mg by mouth every 6 (six) hours as needed for pain.  Marland Kitchen escitalopram (LEXAPRO) 10 MG tablet Take 1.5 tablets (15 mg total) by mouth daily.  Marland Kitchen levocetirizine (XYZAL) 5 MG tablet Take 5 mg by mouth every evening.  Marland Kitchen LORazepam (ATIVAN) 0.5 MG tablet 1/2 tablet q day prn  . montelukast (SINGULAIR) 10 MG tablet Take 10 mg by mouth at bedtime.  . phenytoin (DILANTIN) 100 MG ER capsule Take 1 capsule (100 mg total) by mouth 3 (three) times daily.  . SUMAtriptan (IMITREX) 100 MG tablet Take 100 mg by mouth daily as needed for migraine. May repeat in 2 hours if headache persists or recurs.   No facility-administered encounter medications on file as of 10/20/2017.     Review of Systems  Constitutional: Negative for appetite change and unexpected weight change.  HENT: Negative for  congestion and sinus pressure.   Respiratory: Negative for cough, chest tightness and shortness of breath.   Cardiovascular: Negative for chest pain, palpitations and leg swelling.  Gastrointestinal: Negative for abdominal pain, diarrhea, nausea and vomiting.  Genitourinary: Negative for difficulty urinating and dysuria.  Musculoskeletal: Negative for joint swelling and myalgias.  Skin: Negative for color change and rash.  Neurological: Negative for dizziness.       Headaches stable.   Psychiatric/Behavioral: Negative for agitation.       Increased stress as outlined.         Objective:    Physical Exam  Constitutional: She appears well-developed and well-nourished. No distress.  HENT:  Nose: Nose normal.  Mouth/Throat: Oropharynx is clear and moist.  Neck: Neck supple. No thyromegaly present.  Cardiovascular: Normal rate and regular rhythm.  Pulmonary/Chest: Breath sounds normal. No respiratory distress. She has no wheezes.  Abdominal: Soft. Bowel sounds are normal. There is no tenderness.  Musculoskeletal: She exhibits no edema or tenderness.  Lymphadenopathy:    She has no cervical adenopathy.  Skin: No rash noted. No erythema.  Psychiatric: She has a normal mood and affect. Her behavior is normal.    BP 100/68 (BP Location: Left Arm, Patient Position: Sitting, Cuff Size: Normal)   Pulse 88   Temp 98.2 F (36.8 C) (Oral)   Resp 18   Ht 5\' 6"  (1.676 m)   Wt 154 lb 8 oz (70.1 kg)   LMP 02/24/2011   SpO2 95%   BMI 24.94 kg/m  Wt Readings from Last 3 Encounters:  10/20/17 154 lb 8 oz (70.1 kg)  07/15/17 153 lb 3.2 oz (69.5 kg)  06/28/17 154 lb (69.9 kg)     Lab Results  Component Value Date   WBC 4.8 10/20/2017   HGB 13.3 10/20/2017   HCT 39.2 10/20/2017   PLT 308.0 10/20/2017   GLUCOSE 94 10/20/2017   CHOL 194 10/20/2017   TRIG 98.0 10/20/2017   HDL 67.40 10/20/2017   LDLCALC 107 (H) 10/20/2017   ALT 16 10/20/2017   AST 13 10/20/2017   NA 141 10/20/2017     K 3.7 10/20/2017   CL 104 10/20/2017   CREATININE 0.80 10/20/2017   BUN 7 10/20/2017   CO2 31 10/20/2017   TSH 1.92 10/20/2017       Assessment & Plan:   Problem List Items Addressed This Visit    B12 deficiency    Check B12 level.  Relevant Orders   Vitamin B12 (Completed)   Depression    Increased stress as outlined.  Discussed with her today.  Seeing a Social worker.  On lexapro.  Doing well with this.  Follow.  Does not feel needs any further intervention.        Environmental allergies    Controlled on current regimen.  Follow.        Migraine headache    Overall stable.  Follow.  Continue f/u with neurology.        Neck nodule    Not documented on exam today.  Follow.       Seizure disorder [facility])    Sees neurology.  On dilantin.  Check dilantin level.  No seizures.  Follow.  Recent MRI negative.        Stress    Increased stress as outlined.  On lexapro.  Seeing a Social worker. Discussed with her today.  She does not feel needs any further intervention.  Follow.        Vitamin D deficiency    Follow vitamin D level.         Other Visit Diagnoses    Screening cholesterol level           Einar Pheasant, MD

## 2017-10-20 NOTE — Progress Notes (Signed)
Pre-visit discussion using our clinic review tool. No additional management support is needed unless otherwise documented below in the visit note.  

## 2017-10-21 LAB — PHENYTOIN LEVEL, TOTAL: Phenytoin, Total: 12.2 mg/L (ref 10.0–20.0)

## 2017-10-23 ENCOUNTER — Encounter: Payer: Self-pay | Admitting: Internal Medicine

## 2017-10-23 NOTE — Assessment & Plan Note (Signed)
Overall stable.  Follow.  Continue f/u with neurology.

## 2017-10-23 NOTE — Assessment & Plan Note (Signed)
Sees neurology.  On dilantin.  Check dilantin level.  No seizures.  Follow.  Recent MRI negative.

## 2017-10-23 NOTE — Assessment & Plan Note (Signed)
Increased stress as outlined.  Discussed with her today.  Seeing a Social worker.  On lexapro.  Doing well with this.  Follow.  Does not feel needs any further intervention.

## 2017-10-23 NOTE — Assessment & Plan Note (Signed)
Check B12 level. 

## 2017-10-23 NOTE — Assessment & Plan Note (Signed)
Increased stress as outlined.  On lexapro.  Seeing a Social worker. Discussed with her today.  She does not feel needs any further intervention.  Follow.

## 2017-10-23 NOTE — Assessment & Plan Note (Signed)
Controlled on current regimen.  Follow.  

## 2017-10-23 NOTE — Assessment & Plan Note (Signed)
Follow vitamin D level.  

## 2017-10-23 NOTE — Assessment & Plan Note (Signed)
Not documented on exam today.  Follow.

## 2017-10-25 ENCOUNTER — Encounter: Payer: Self-pay | Admitting: Internal Medicine

## 2017-10-25 ENCOUNTER — Telehealth: Payer: Medicaid Other | Admitting: Nurse Practitioner

## 2017-10-25 ENCOUNTER — Telehealth: Payer: Self-pay | Admitting: Internal Medicine

## 2017-10-25 ENCOUNTER — Ambulatory Visit: Payer: Self-pay

## 2017-10-25 DIAGNOSIS — N3001 Acute cystitis with hematuria: Secondary | ICD-10-CM

## 2017-10-25 MED ORDER — NITROFURANTOIN MONOHYD MACRO 100 MG PO CAPS: 100.0000 mg | ORAL_CAPSULE | Freq: Two times a day (BID) | ORAL | 0 refills | Status: DC

## 2017-10-25 NOTE — Telephone Encounter (Signed)
Left message to call back and schedule appt with another provider.

## 2017-10-25 NOTE — Telephone Encounter (Signed)
Please advise 

## 2017-10-25 NOTE — Telephone Encounter (Signed)
Copied from [place] [id number]. Topic: Quick Communication - Lab Results >> Oct 24, 2017  3:05 PM Lars Masson, LPN wrote: Called patient to inform them of lab results. When patient returns call, triage nurse may disclose results.

## 2017-10-25 NOTE — Telephone Encounter (Signed)
Telephone call from Client requesting lab results. Provided results per Einar Pheasant, MD. Client voiced understanding.  Client voiced understanding, would like a call form Dr. Nicki Reaper.   To verify if  She has called in the prescriptions to [facility] on [address]. Client would like to know if her husband can continue with administering the injections since he is a [employer] scientist. Client will be anticipating call from provider.      Marland Kitchen

## 2017-10-25 NOTE — Progress Notes (Signed)

## 2017-10-25 NOTE — Telephone Encounter (Signed)
Left message on voicemail for pt to return call to office for results. See result note   

## 2017-10-26 ENCOUNTER — Other Ambulatory Visit: Payer: Self-pay

## 2017-10-26 MED ORDER — VITAMIN D (ERGOCALCIFEROL) 1.25 MG (50000 UNIT) PO CAPS: 50000.0000 [IU] | ORAL_CAPSULE | ORAL | 0 refills | Status: DC

## 2017-10-26 NOTE — Telephone Encounter (Signed)
I have sent in the Vit D. OK for me to send in B12 inj for her to get at home?

## 2017-10-27 ENCOUNTER — Other Ambulatory Visit: Payer: Self-pay | Admitting: *Deleted

## 2017-10-27 ENCOUNTER — Telehealth: Payer: Self-pay | Admitting: Internal Medicine

## 2017-10-27 MED ORDER — CYANOCOBALAMIN 1000 MCG/ML IJ SOLN: 1000.0000 ug | Freq: Once | INTRAMUSCULAR | 15 refills | Status: AC

## 2017-10-27 MED ORDER — "SYRINGE/NEEDLE (DISP) 25G X 1"" 3 ML MISC": 0 refills | Status: DC

## 2017-10-27 NOTE — Telephone Encounter (Signed)
Please let her know that I am not in the office this week.  If her husband has been giving her these (previously), then ok to send in rx to continue at home.

## 2017-10-27 NOTE — Progress Notes (Signed)
Returned call to patient and discussed labs, and called in B 12 and syringes for patient .

## 2017-10-27 NOTE — Telephone Encounter (Signed)
Pt had e visit

## 2017-10-27 NOTE — Telephone Encounter (Signed)
Please advise 

## 2017-10-27 NOTE — Telephone Encounter (Signed)
Pt requesting call back from [place] to go over results. She didn't feel the nurse who gave her results was confident in what she was told after she reviewed in mychart.   Copied from [place] [phone]. Topic: Quick Communication - Lab Results >> Oct 24, 2017  3:05 PM Lars Masson, LPN wrote: Called patient to inform them of lab results. When patient returns call, triage nurse may disclose results.

## 2017-12-16 ENCOUNTER — Encounter: Payer: Self-pay | Admitting: Internal Medicine

## 2017-12-16 MED ORDER — MONTELUKAST SODIUM 10 MG PO TABS: 10.0000 mg | ORAL_TABLET | Freq: Every day | ORAL | 2 refills | Status: DC

## 2017-12-16 MED ORDER — LEVOCETIRIZINE DIHYDROCHLORIDE 5 MG PO TABS: 5.0000 mg | ORAL_TABLET | Freq: Every evening | ORAL | 2 refills | Status: DC

## 2017-12-16 NOTE — Telephone Encounter (Signed)
rx sent in for singulair and xyzal per my chart request.

## 2018-01-10 ENCOUNTER — Other Ambulatory Visit: Payer: Self-pay | Admitting: Internal Medicine

## 2018-01-20 IMAGING — RF DG MYELOGRAPHY LUMBAR INJ LUMBOSACRAL
1 series · 6 of 6 positions shown · IV contrast (agent unspecified)
Comparison: None

CLINICAL DATA: Low back pain, bilateral leg pain for a month.
TECHNIQUE: Contiguous axial images were obtained through the Lumbar spine after
the intrathecal infusion of infusion. Coronal and sagittal
reconstructions were obtained of the axial image sets. The findings
are reported on the CT of the lumbar spine with contrast.

[Series 1: cp_standard · 0.27mm/px · 6 of 6 slices shown]
[im 1/6]
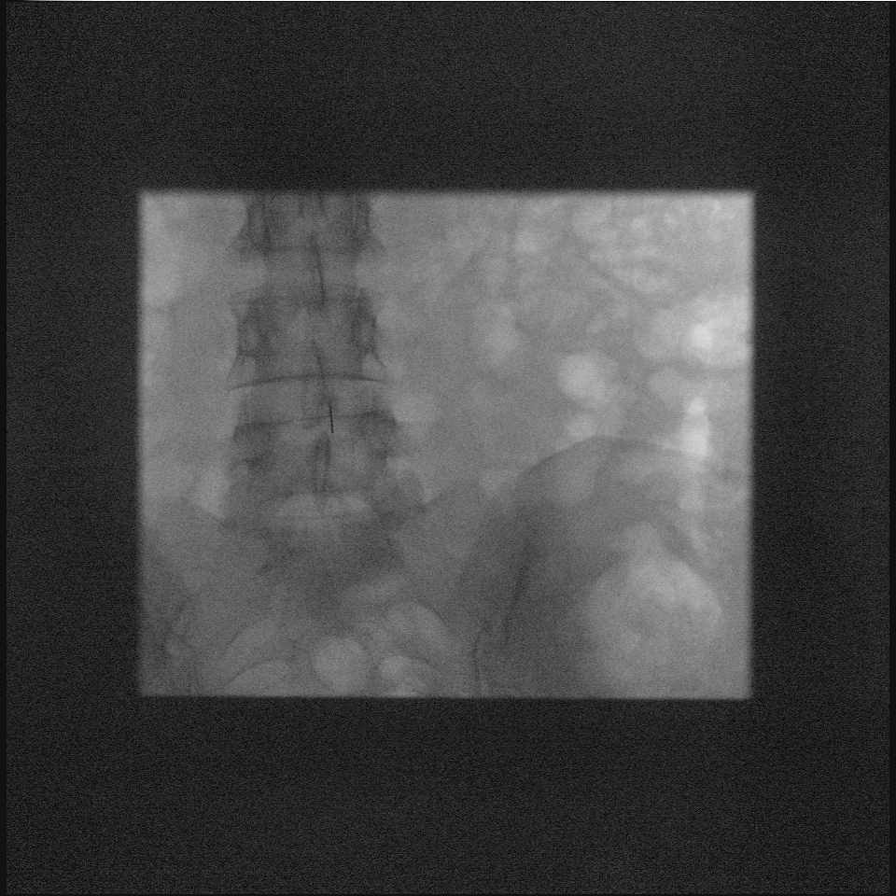
[im 2/6]
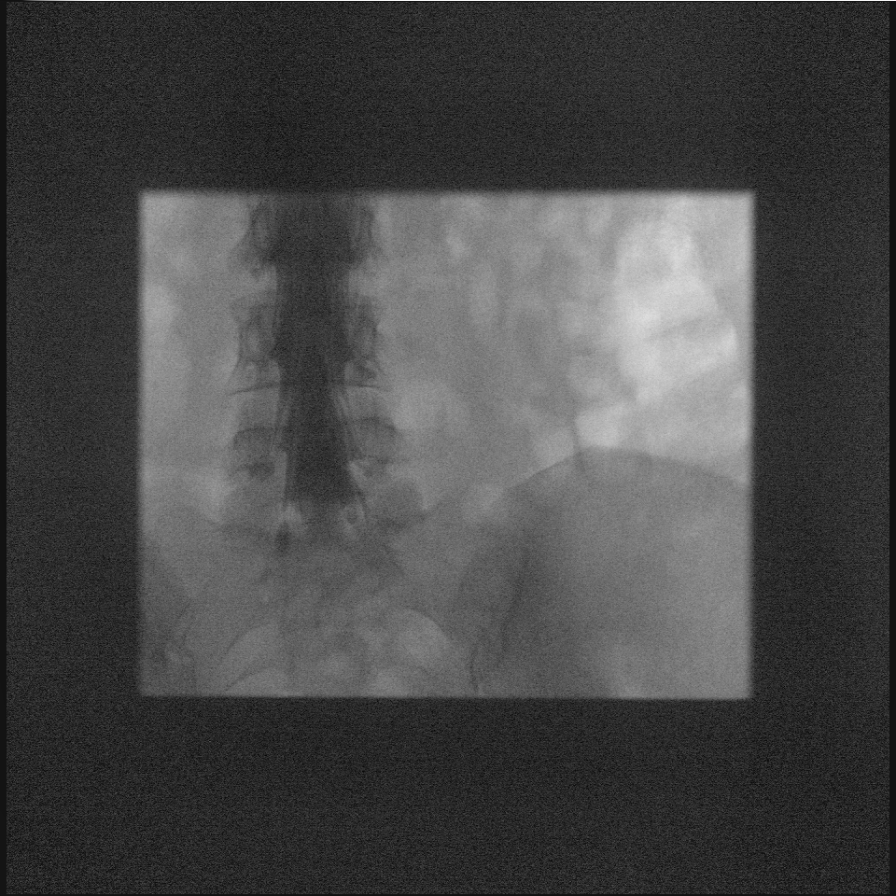
[im 3/6]
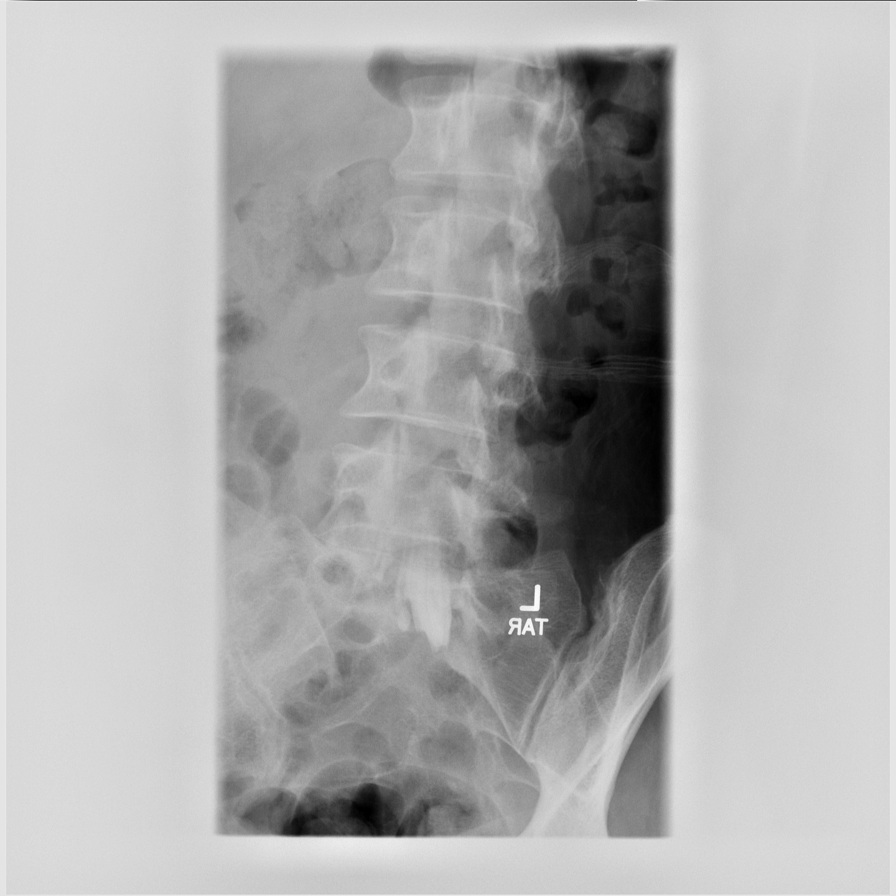
[im 4/6]
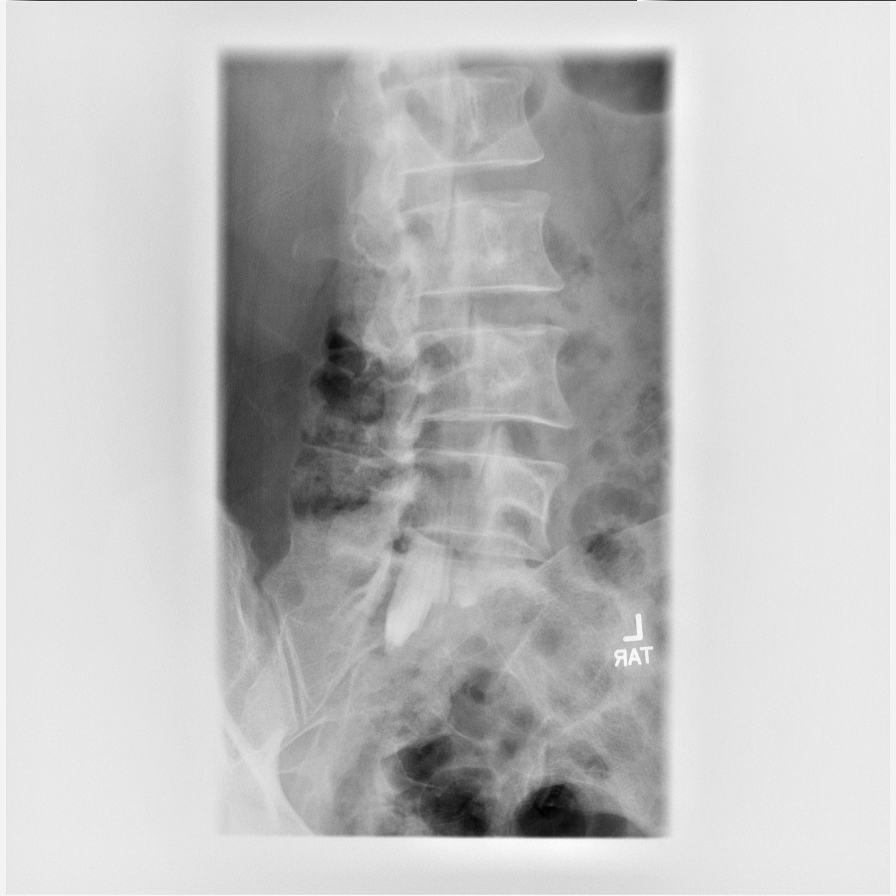
[im 5/6]
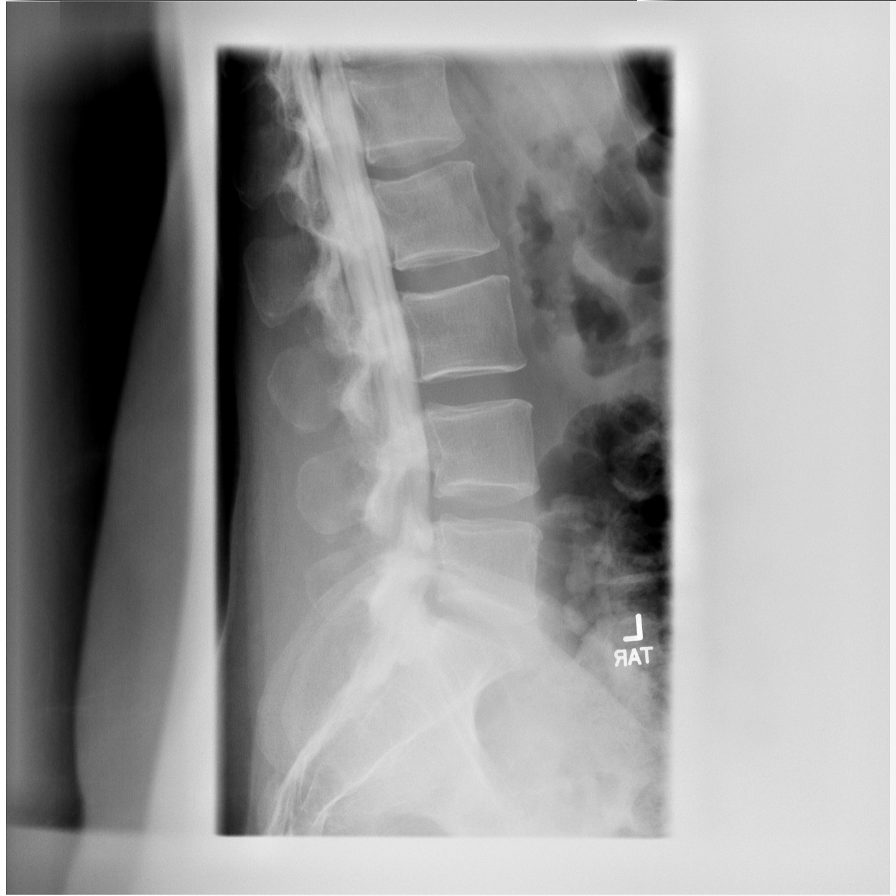
[im 6/6]
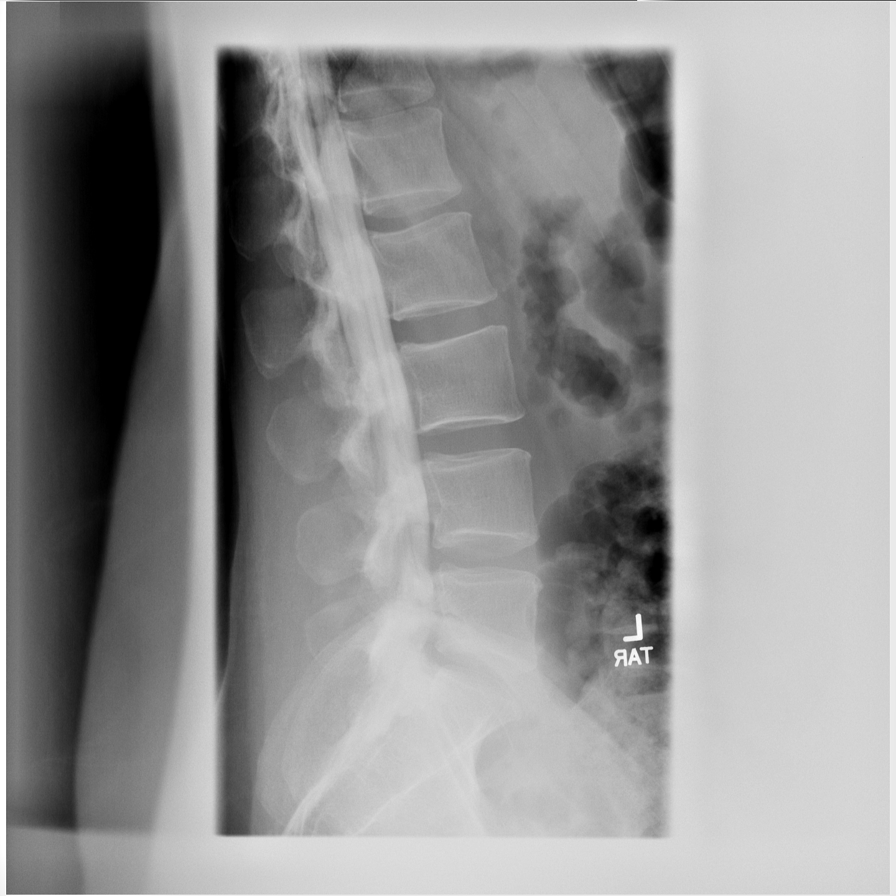

[6 of 6 positions shown; findings below may reference images not displayed]

EXAM:
LUMBAR MYELOGRAM

FLUOROSCOPY TIME:  0.9 minutes

PROCEDURE:
After thorough discussion of risks and benefits of the procedure
including bleeding, infection, injury to nerves, blood vessels,
adjacent structures as well as headache and CSF leak, written and
oral informed consent was obtained. Consent was obtained by Dr.
Moje Ime Bagic. Time out form was completed.

Patient was positioned prone on the fluoroscopy table. Local
anesthesia was provided with 1% lidocaine without epinephrine after
prepped and draped in the usual sterile fashion. Puncture was
performed at L4-5 using a 3 1/2 inch 22-gauge spinal needle via
right paramedian approach. Using a single pass through the dura, the
needle was placed within the thecal sac, with return of clear CSF.
15 mL of Isovue M 200 was injected into the thecal sac, with normal
opacification of the nerve roots and cauda equina consistent with
free flow within the subarachnoid space.

I personally performed the lumbar puncture and administered the
intrathecal contrast. I also personally supervised acquisition of
the myelogram images.
FINDINGS: Intrathecal contrast is present.
IMPRESSION: Successful fluoroscopic guided lumbar myelogram prior to CT.

## 2018-01-20 IMAGING — CT CT L SPINE W/ CM
3 of 4 series · 13 of 33 positions shown, 16 images · non-contrast
Comparison: MRI lumbar spine 04/30/2016.

CLINICAL DATA: BILATERAL lower extremity pain, worse on the LEFT.
Symptoms on the RIGHT were initially described, but improved after
the lumbar epidural steroid injection.

EXAM:
CT MYELOGRAPHY LUMBAR SPINE
TECHNIQUE: CT imaging of the lumbar spine was performed after intrathecal
contrast administration. Multiplanar CT image reconstructions were
also generated.

[Series 7: sagittal bone · sagittal · 0.38mm/px · 5 of 65 slices shown, 6 images]
[im 22/65  bone]
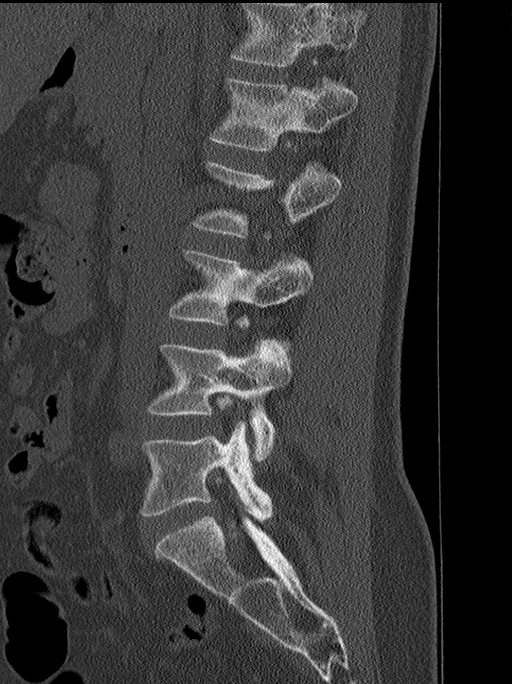
[im 27/65  bone]
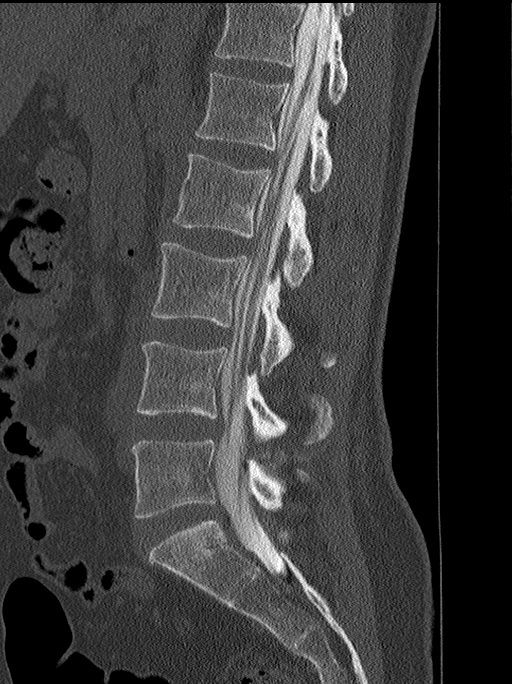
[im 33/65  soft-tissue]
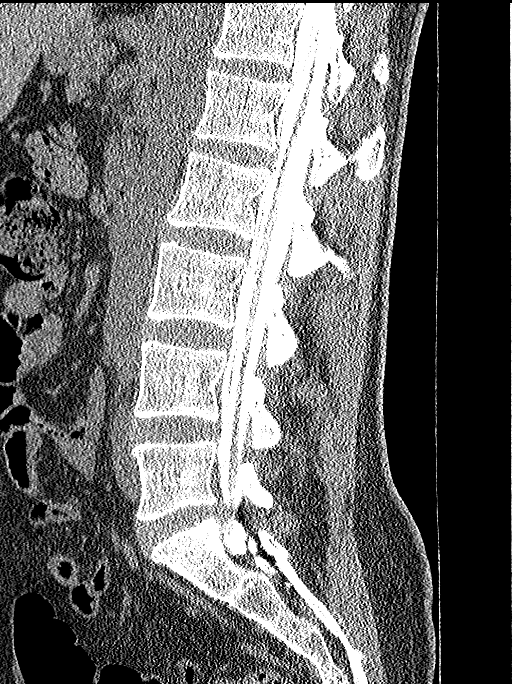
[im 33/65  bone]
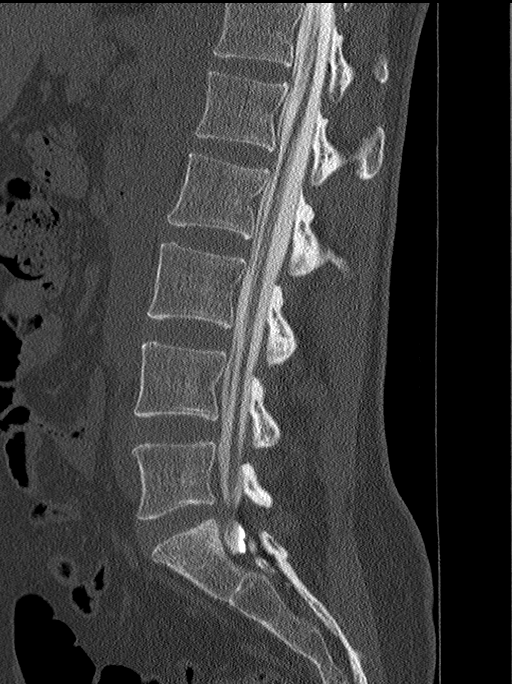
[im 38/65  bone]
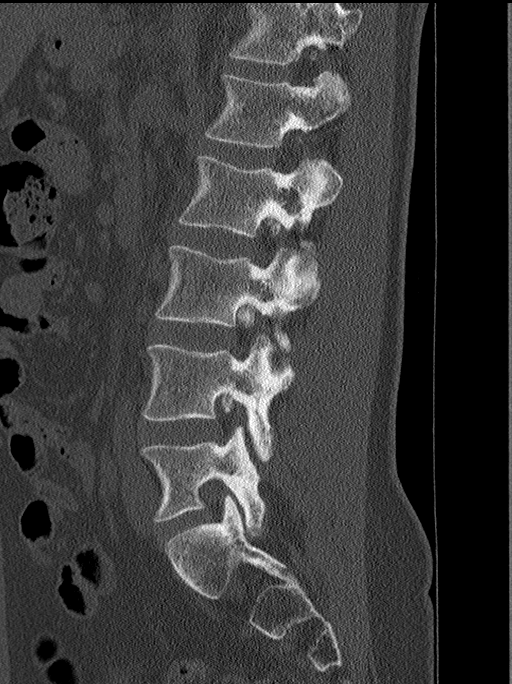
[im 43/65  bone]
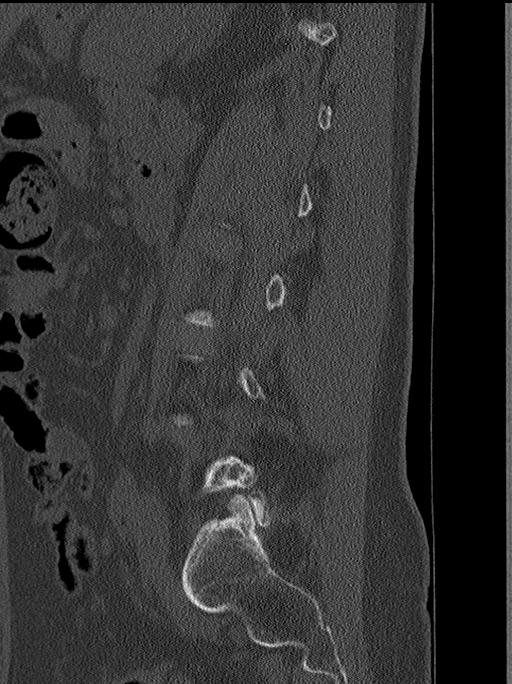

[Series 8: coronal bone · coronal · 0.38mm/px · 3 of 74 slices shown]
[im 15/74  bone]
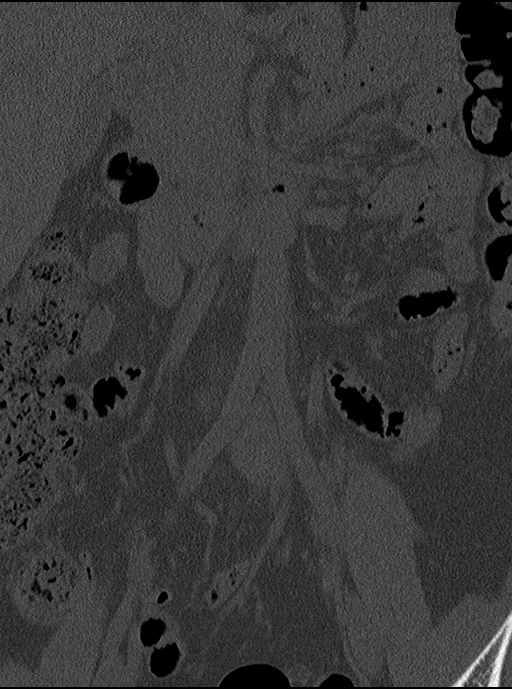
[im 30/74  bone]
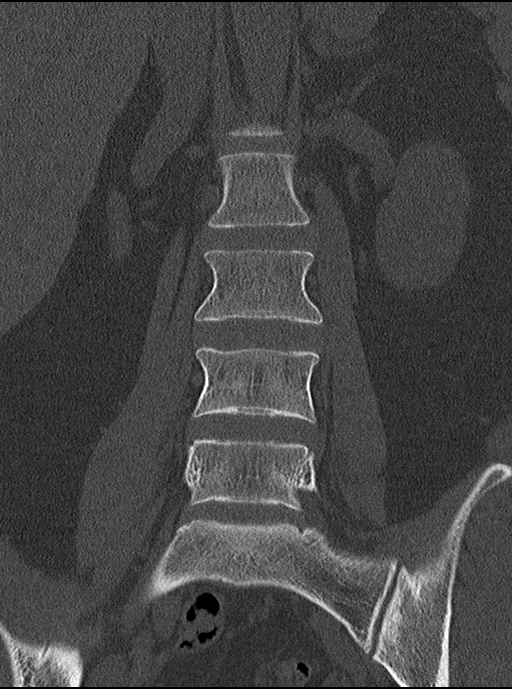
[im 44/74  bone]
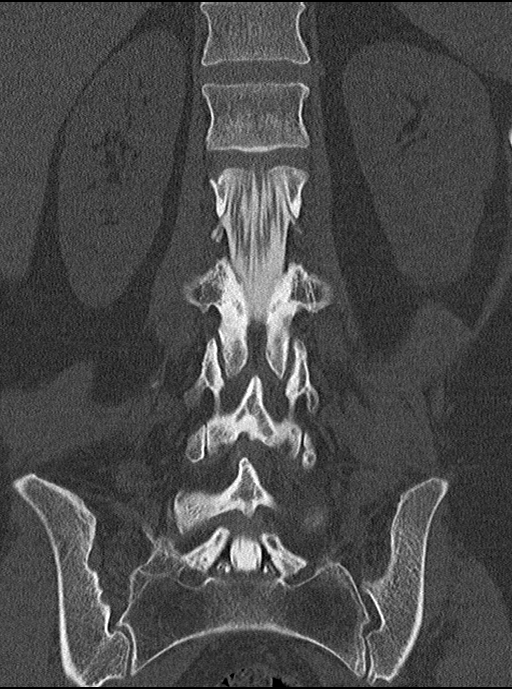

[Series 9: multi disc · axial · 0.21mm/px · z∈[-389,-221]mm · 5 of 123 slices shown, 7 images]
[im 21/123  soft-tissue]
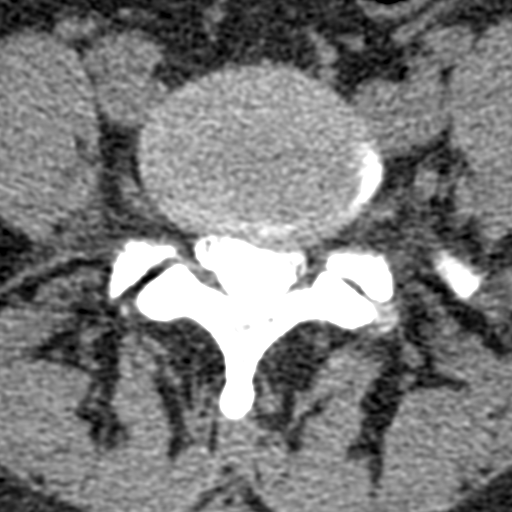
[im 21/123  bone]
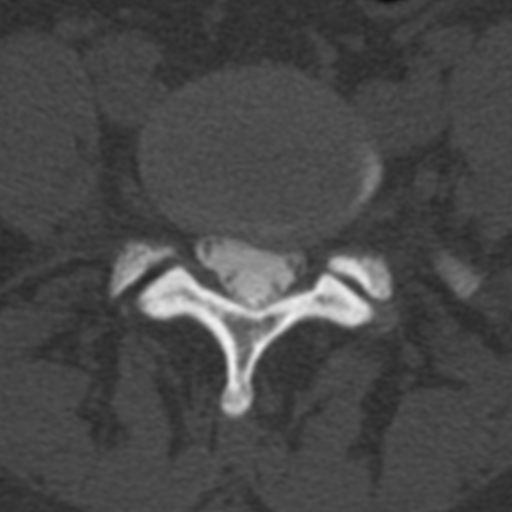
[im 41/123  bone]
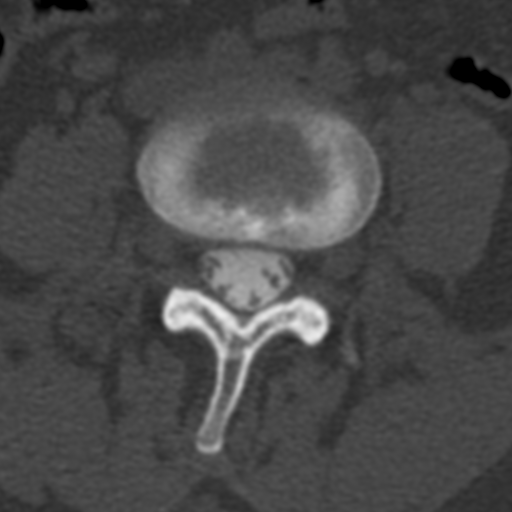
[im 62/123  bone]
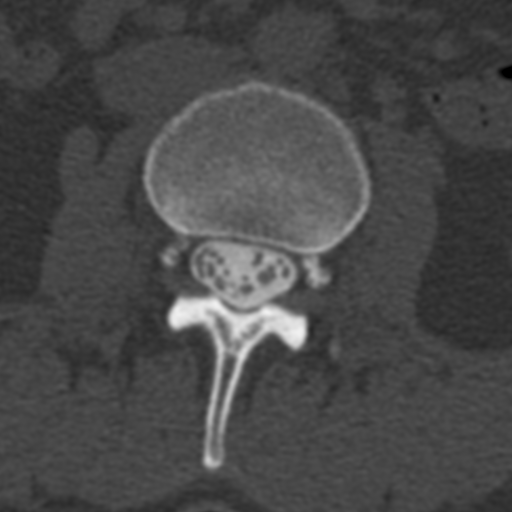
[im 82/123  bone]
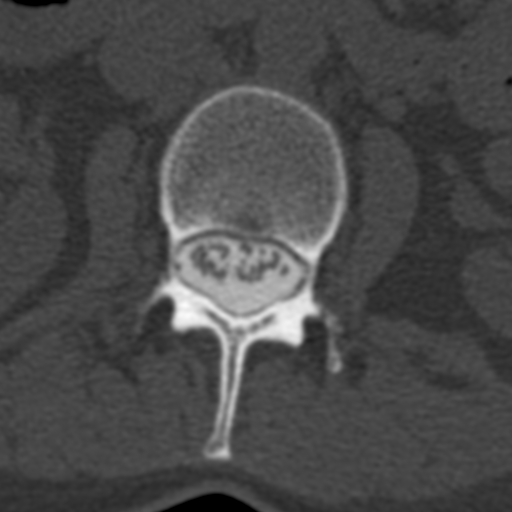
[im 102/123  soft-tissue]
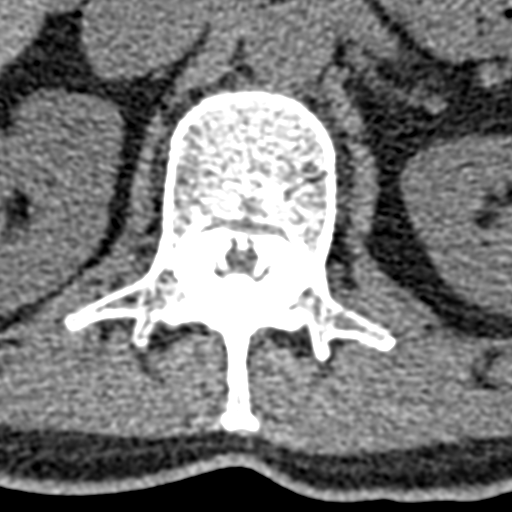
[im 102/123  bone]
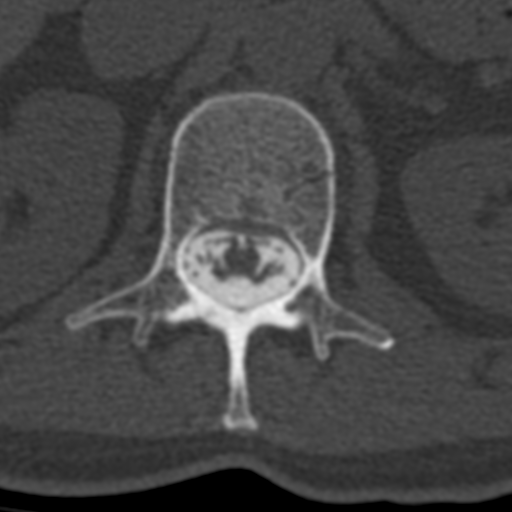

[13 of 33 positions shown; findings below may reference images not displayed]

FINDINGS: Lumbar myelogram imaging reported separately.

Segmentation:  5 lumbar type vertebral bodies.

Alignment: Normal.

Vertebrae: No fracture, evidence of discitis, or bone lesion.

Conus medullaris: Extends to the upper L2 level and appears normal.

Paraspinal and other soft tissues: Unremarkable.

Disc levels:

L1-L2:  Normal.

L2-L3:  Normal.

L3-L4:  Normal.

L4-L5: Small protrusion on the RIGHT. Mild effacement of the RIGHT
L5 nerve root, without significant subarticular zone narrowing or
compression. Mild facet arthropathy. No stenosis.

L5-S1: Central and leftward protrusion. Mild effacement LEFT S1
nerve root, without significant subarticular zone narrowing or
compression. Mild facet arthropathy. No stenosis.

Compared with prior MR, similar appearance.
IMPRESSION: Small protrusions at L4-5 on the RIGHT and L5-S1 on the LEFT,
without significant subarticular zone narrowing or RIGHT L5/LEFT S1
nerve root encroachment, respectively.

## 2018-02-14 ENCOUNTER — Encounter: Payer: Self-pay | Admitting: Internal Medicine

## 2018-02-14 ENCOUNTER — Ambulatory Visit (INDEPENDENT_AMBULATORY_CARE_PROVIDER_SITE_OTHER): Payer: Medicaid Other | Admitting: Internal Medicine

## 2018-02-14 DIAGNOSIS — G43809 Other migraine, not intractable, without status migrainosus: Secondary | ICD-10-CM | POA: Diagnosis not present

## 2018-02-14 DIAGNOSIS — F329 Major depressive disorder, single episode, unspecified: Secondary | ICD-10-CM

## 2018-02-14 DIAGNOSIS — E559 Vitamin D deficiency, unspecified: Secondary | ICD-10-CM

## 2018-02-14 DIAGNOSIS — G40909 Epilepsy, unspecified, not intractable, without status epilepticus: Secondary | ICD-10-CM | POA: Diagnosis not present

## 2018-02-14 DIAGNOSIS — I341 Nonrheumatic mitral (valve) prolapse: Secondary | ICD-10-CM

## 2018-02-14 DIAGNOSIS — F439 Reaction to severe stress, unspecified: Secondary | ICD-10-CM

## 2018-02-14 DIAGNOSIS — F32A Depression, unspecified: Secondary | ICD-10-CM

## 2018-02-14 MED ORDER — LORAZEPAM 0.5 MG PO TABS: ORAL_TABLET | ORAL | 0 refills | Status: DC

## 2018-02-14 MED ORDER — LEVOCETIRIZINE DIHYDROCHLORIDE 5 MG PO TABS: 5.0000 mg | ORAL_TABLET | Freq: Every evening | ORAL | 4 refills | Status: DC

## 2018-02-14 MED ORDER — MONTELUKAST SODIUM 10 MG PO TABS: 10.0000 mg | ORAL_TABLET | Freq: Every day | ORAL | 4 refills | Status: DC

## 2018-02-14 NOTE — Progress Notes (Signed)
Patient ID: [name], female   DOB: [birth year], 47 y.o.   MRN: [id number]   Subjective:    Patient ID: [name], female    DOB: 06-21-1970, 47 y.o.   MRN: [id number]  HPI  Patient here for a scheduled follow up.  Still with increased stress.  Discussed with her today.  Seeing a Social worker.  On lexapro.   She does not feel she needs anything more at this time.  Tries to stay active.  Still with headaches.  May have 2-3/month.  One "bad" headache per month.  Does not use the imitrex often.  Does not like the way it makes her feel.  She sees neurology.  Discussed earlier appt.  She is in agreement.  Breathing stable.  Reports no bowel issues.  Due to f/u with Dr Fletcher Anon soon.     Past Medical History:  Diagnosis Date  . Anxiety   . Arthritis   . Depression   . Environmental allergies   . Gestational diabetes   . History of chicken pox   . Migraines   . MVP (mitral valve prolapse)   . Seizures ([place])    Past Surgical History:  Procedure Laterality Date  . ABDOMINAL HYSTERECTOMY  02/2011   abnormal pap (stage III dysplasia).   ovaries not removed.  Marland Kitchen abscess removal  2007   cervix   . DILATION AND CURETTAGE OF UTERUS  2002, 2005  . LEEP  2008  . WISDOM TOOTH EXTRACTION  2003   Family History  Problem Relation Age of Onset  . Hypercholesterolemia Mother   . Hypercholesterolemia Father   . Diabetes Paternal Grandfather   . Prostate cancer Paternal Grandfather   . Breast cancer Maternal Aunt        50's  . Colon cancer Neg Hx    Social History   Socioeconomic History  . Marital status: Divorced    Spouse name: Not on file  . Number of children: 2  . Years of education: Not on file  . Highest education level: Not on file  Occupational History  . Not on file  Social Needs  . Financial resource strain: Not on file  . Food insecurity:    Worry: Not on file    Inability: Not on file  . Transportation needs:    Medical: Not on file    Non-medical:  Not on file  Tobacco Use  . Smoking status: Never Smoker  . Smokeless tobacco: Never Used  Substance and Sexual Activity  . Alcohol use: No    Alcohol/week: 0.0 standard drinks    Comment: rarely  . Drug use: No  . Sexual activity: Not on file  Lifestyle  . Physical activity:    Days per week: Not on file    Minutes per session: Not on file  . Stress: Not on file  Relationships  . Social connections:    Talks on phone: Not on file    Gets together: Not on file    Attends religious service: Not on file    Active member of club or organization: Not on file    Attends meetings of clubs or organizations: Not on file    Relationship status: Not on file  Other Topics Concern  . Not on file  Social History Narrative  . Not on file    Outpatient Encounter Medications as of 02/14/2018  Medication Sig  . acetaminophen (TYLENOL) 500 MG tablet Take 500 mg by mouth as needed.  Marland Kitchen  aspirin 325 MG EC tablet Take 325 mg by mouth every 6 (six) hours as needed for pain.  Marland Kitchen escitalopram (LEXAPRO) 10 MG tablet TAKE 1 AND 1/2 TABLETS(15 MG) BY MOUTH DAILY  . levocetirizine (XYZAL) 5 MG tablet Take 1 tablet (5 mg total) by mouth every evening.  Marland Kitchen LORazepam (ATIVAN) 0.5 MG tablet 1/2 tablet q day prn  . montelukast (SINGULAIR) 10 MG tablet Take 1 tablet (10 mg total) by mouth at bedtime.  . nitrofurantoin, macrocrystal-monohydrate, (MACROBID) 100 MG capsule Take 1 capsule (100 mg total) by mouth 2 (two) times daily. 1 po BId  . phenytoin (DILANTIN) 100 MG ER capsule Take 1 capsule (100 mg total) by mouth 3 (three) times daily.  . SUMAtriptan (IMITREX) 100 MG tablet Take 100 mg by mouth daily as needed for migraine. May repeat in 2 hours if headache persists or recurs.  Marland Kitchen SYRINGE-NEEDLE, DISP, 3 ML 25G X 1" 3 ML MISC Use syringe/needle for each dose of B-12. Per B-12 orders.  . Vitamin D, Ergocalciferol, (DRISDOL) 50000 units CAPS capsule Take 1 capsule (50,000 Units total) by mouth every 7 (seven)  days.  . [DISCONTINUED] levocetirizine (XYZAL) 5 MG tablet Take 1 tablet (5 mg total) by mouth every evening.  . [DISCONTINUED] LORazepam (ATIVAN) 0.5 MG tablet 1/2 tablet q day prn  . [DISCONTINUED] montelukast (SINGULAIR) 10 MG tablet Take 1 tablet (10 mg total) by mouth at bedtime.   No facility-administered encounter medications on file as of 02/14/2018.     Review of Systems  Constitutional: Negative for appetite change and unexpected weight change.  HENT: Negative for congestion and sinus pressure.   Respiratory: Negative for cough, chest tightness and shortness of breath.   Cardiovascular: Negative for chest pain, palpitations and leg swelling.  Gastrointestinal: Negative for abdominal pain, diarrhea, nausea and vomiting.  Genitourinary: Negative for difficulty urinating and dysuria.  Musculoskeletal: Negative for joint swelling and myalgias.  Skin: Negative for color change and rash.  Neurological: Negative for dizziness and light-headedness.       Migraines - 2-3/month.  Overall stable.   Psychiatric/Behavioral: Negative for agitation and dysphoric mood.       Objective:    Physical Exam  Constitutional: She appears well-developed and well-nourished. No distress.  HENT:  Nose: Nose normal.  Mouth/Throat: Oropharynx is clear and moist.  Neck: Neck supple. No thyromegaly present.  Cardiovascular: Normal rate and regular rhythm.  Pulmonary/Chest: Breath sounds normal. No respiratory distress. She has no wheezes.  Abdominal: Soft. Bowel sounds are normal. There is no tenderness.  Musculoskeletal: She exhibits no edema or tenderness.  Lymphadenopathy:    She has no cervical adenopathy.  Skin: No rash noted. No erythema.  Psychiatric: She has a normal mood and affect. Her behavior is normal.    BP 100/70 (BP Location: Right Arm, Patient Position: Sitting, Cuff Size: Normal)   Pulse 87   Temp 98.2 F (36.8 C) (Oral)   Resp 15   Ht 5\' 6"  (1.676 m)   Wt 157 lb 6 oz (71.4  kg)   LMP 02/24/2011   SpO2 97%   BMI 25.40 kg/m  Wt Readings from Last 3 Encounters:  02/14/18 157 lb 6 oz (71.4 kg)  10/20/17 154 lb 8 oz (70.1 kg)  07/15/17 153 lb 3.2 oz (69.5 kg)     Lab Results  Component Value Date   WBC 4.8 10/20/2017   HGB 13.3 10/20/2017   HCT 39.2 10/20/2017   PLT 308.0 10/20/2017   GLUCOSE 94  10/20/2017   CHOL 194 10/20/2017   TRIG 98.0 10/20/2017   HDL 67.40 10/20/2017   LDLCALC 107 (H) 10/20/2017   ALT 16 10/20/2017   AST 13 10/20/2017   NA 141 10/20/2017   K 3.7 10/20/2017   CL 104 10/20/2017   CREATININE 0.80 10/20/2017   BUN 7 10/20/2017   CO2 31 10/20/2017   TSH 1.92 10/20/2017       Assessment & Plan:   Problem List Items Addressed This Visit    Depression    On lexapro.  Feels she is doing well on this medication.  Does not feel needs any further intervention.  Follow.        Relevant Medications   LORazepam (ATIVAN) 0.5 MG tablet   Migraine headache    Followed by neurology.  Has imitrex, but does not like the way it makes her feel.  Schedule earlier f/u with neurology.        MVP (mitral valve prolapse)    ECHO with moderate MR.  Sees Dr Fletcher Anon.  Recommended echo yearly.        Seizure disorder ([place])    On dilantin.  Followed by neurology.  Stable.       Stress    Increased stress.  Discussed with her today.  On lexapro.  Doe not feel needs any further intervention.  Follow.        Vitamin D deficiency    Follow vitamin D level.            Einar Pheasant, MD

## 2018-02-19 ENCOUNTER — Encounter: Payer: Self-pay | Admitting: Internal Medicine

## 2018-02-19 NOTE — Assessment & Plan Note (Signed)
On lexapro.  Feels she is doing well on this medication.  Does not feel needs any further intervention.  Follow.

## 2018-02-19 NOTE — Assessment & Plan Note (Signed)
ECHO with moderate MR.  Sees Dr Fletcher Anon.  Recommended echo yearly.

## 2018-02-19 NOTE — Assessment & Plan Note (Signed)
On dilantin.  Followed by neurology.  Stable.

## 2018-02-19 NOTE — Assessment & Plan Note (Signed)
Follow vitamin D level.  

## 2018-02-19 NOTE — Assessment & Plan Note (Signed)
Followed by neurology.  Has imitrex, but does not like the way it makes her feel.  Schedule earlier f/u with neurology.

## 2018-02-19 NOTE — Assessment & Plan Note (Signed)
Increased stress.  Discussed with her today.  On lexapro.  Doe not feel needs any further intervention.  Follow.

## 2018-02-20 NOTE — Telephone Encounter (Signed)
-----   Message from Eustace Pen sent at 02/20/2018  8:56 AM EDT ----- Regarding: RE: [place] neurology appt She has been scheduled for Nov. 1 at 9:00. I've sent her a mychart message with her appt info. Thanks Melissa ----- Message ----- From: Einar Pheasant, MD Sent: 02/19/2018   1:02 AM EDT To: Eustace Pen Subject: [place] [facility] appt                          brinlee has h/o migraines.  Has imitrex, but does not tolerate.  Given persistent problems with headaches, needs earlier appt with Dr Manuella Ghazi.  Has seen Dr Manuella Ghazi.  Let me know if I need to place order for referral.    Thanks   Dr Nicki Reaper

## 2018-03-19 ENCOUNTER — Other Ambulatory Visit: Payer: Self-pay | Admitting: Internal Medicine

## 2018-03-22 ENCOUNTER — Other Ambulatory Visit: Payer: Self-pay | Admitting: Internal Medicine

## 2018-04-02 DIAGNOSIS — G40109 Localization-related (focal) (partial) symptomatic epilepsy and epileptic syndromes with simple partial seizures, not intractable, without status epilepticus: Secondary | ICD-10-CM | POA: Insufficient documentation

## 2018-04-02 DIAGNOSIS — G43719 Chronic migraine without aura, intractable, without status migrainosus: Secondary | ICD-10-CM | POA: Insufficient documentation

## 2018-04-21 ENCOUNTER — Other Ambulatory Visit: Payer: Self-pay | Admitting: Internal Medicine

## 2018-05-19 ENCOUNTER — Encounter: Payer: Self-pay | Admitting: Internal Medicine

## 2018-06-05 ENCOUNTER — Ambulatory Visit: Payer: Medicaid Other | Admitting: Internal Medicine

## 2018-06-06 ENCOUNTER — Other Ambulatory Visit: Payer: Self-pay | Admitting: Internal Medicine

## 2018-06-06 MED ORDER — ESCITALOPRAM OXALATE 10 MG PO TABS: ORAL_TABLET | ORAL | 0 refills | Status: DC

## 2018-07-11 ENCOUNTER — Encounter: Payer: Self-pay | Admitting: Internal Medicine

## 2018-07-11 ENCOUNTER — Other Ambulatory Visit: Payer: Self-pay | Admitting: Internal Medicine

## 2018-07-12 ENCOUNTER — Other Ambulatory Visit: Payer: Self-pay

## 2018-07-12 MED ORDER — LORAZEPAM 0.5 MG PO TABS: ORAL_TABLET | ORAL | 0 refills | Status: DC

## 2018-07-12 MED ORDER — ESCITALOPRAM OXALATE 10 MG PO TABS: ORAL_TABLET | ORAL | 0 refills | Status: DC

## 2018-07-12 MED ORDER — ESCITALOPRAM OXALATE 10 MG PO TABS: ORAL_TABLET | ORAL | 2 refills | Status: DC

## 2018-07-12 NOTE — Progress Notes (Signed)
Last OV 02/14/18 Next OV none Last refill 02/14/18  Pt says she is going to call and schedule a follow up. She still has a few of the ativan left but will need refill soon.

## 2018-07-12 NOTE — Progress Notes (Signed)
rx sent in for lorazepam #30 with no refills.   

## 2018-07-12 NOTE — Telephone Encounter (Signed)
Last OV 02/14/2018   Ativan last refilled 02/14/2018 disp 30 with no refills   lexapro last refilled 06/06/2018 disp 45 with no refills   Sent to PCP for approval

## 2018-08-25 ENCOUNTER — Other Ambulatory Visit: Payer: Self-pay | Admitting: Internal Medicine

## 2018-09-12 ENCOUNTER — Other Ambulatory Visit: Payer: Self-pay | Admitting: Internal Medicine

## 2018-10-13 ENCOUNTER — Encounter: Payer: Self-pay | Admitting: Internal Medicine

## 2018-10-13 ENCOUNTER — Other Ambulatory Visit: Payer: Self-pay | Admitting: Internal Medicine

## 2018-10-15 NOTE — Telephone Encounter (Signed)
See me before calling.  Do you mind calling Alyssa Bautista and informing her that I do not mind seeing her husband, but I am not sure where we can work him in.  Sounds like needs labs, etc.  See me.  Thanks.

## 2018-10-15 NOTE — Telephone Encounter (Signed)
See me before calling.  See other my chart message for specifics.

## 2018-10-25 ENCOUNTER — Telehealth: Payer: Self-pay

## 2018-10-25 NOTE — Telephone Encounter (Signed)
Virtual Visit Pre-Appointment Phone Call  "Alyssa Bautista, I am calling you today to discuss your upcoming appointment. We are currently trying to limit exposure to the virus that causes COVID-19 by seeing patients at home rather than in the office."  1. "What is the BEST phone number to call the day of the visit?" - include this in appointment notes  2. "Do you have or have access to (through a family member/friend) a smartphone with video capability that we can use for your visit?" a. If yes - list this number in appt notes as "cell" (if different from BEST phone #) and list the appointment type as a VIDEO visit in appointment notes b. If no - list the appointment type as a PHONE visit in appointment notes  3. Confirm consent - "In the setting of the current Covid19 crisis, you are scheduled for a video visit with your provider on 11/07/2018 at 3:00PM.  Just as we do with many in-office visits, in order for you to participate in this visit, we must obtain consent.  If you'd like, I can send this to your mychart (if signed up) or email for you to review.  Otherwise, I can obtain your verbal consent now.  All virtual visits are billed to your insurance company just like a normal visit would be.  By agreeing to a virtual visit, we'd like you to understand that the technology does not allow for your provider to perform an examination, and thus may limit your provider's ability to fully assess your condition. If your provider identifies any concerns that need to be evaluated in person, we will make arrangements to do so.  Finally, though the technology is pretty good, we cannot assure that it will always work on either your or our end, and in the setting of a video visit, we may have to convert it to a phone-only visit.  In either situation, we cannot ensure that we have a secure connection.  Are you willing to proceed?" STAFF: Did the patient verbally acknowledge consent to telehealth visit? Document YES/NO  here: YES  4. Advise patient to be prepared - "Two hours prior to your appointment, go ahead and check your blood pressure, pulse, oxygen saturation, and your weight (if you have the equipment to check those) and write them all down. When your visit starts, your provider will ask you for this information. If you have an Apple Watch or Kardia device, please plan to have heart rate information ready on the day of your appointment. Please have a pen and paper handy nearby the day of the visit as well."  5. Give patient instructions for MyChart download to smartphone OR Doximity/Doxy.me as below if video visit (depending on what platform provider is using)  6. Inform patient they will receive a phone call 15 minutes prior to their appointment time (may be from unknown caller ID) so they should be prepared to answer    TELEPHONE CALL NOTE  Alyssa Bautista has been deemed a candidate for a follow-up tele-health visit to limit community exposure during the Covid-19 pandemic. I spoke with the patient via phone to ensure availability of phone/video source, confirm preferred email & phone number, and discuss instructions and expectations.  I reminded Alyssa Bautista to be prepared with any vital sign and/or heart rhythm information that could potentially be obtained via home monitoring, at the time of her visit. I reminded Alyssa Bautista to expect a phone call prior to her visit.  Alyssa Bautista 10/25/2018 9:40 AM   INSTRUCTIONS FOR DOWNLOADING THE MYCHART APP TO SMARTPHONE  - The patient must first make sure to have activated MyChart and know their login information - If Apple, go to [employer] and type in MyChart in the search bar and download the app. If Android, ask patient to go to [employer] and type in [place] in the search bar and download the app. The app is free but as with any other app downloads, their phone may require them to verify saved payment  information or Apple/Android password.  - The patient will need to then log into the app with their MyChart username and password, and select [facility] as their healthcare provider to link the account. When it is time for your visit, go to the MyChart app, find appointments, and click Begin Video Visit. Be sure to Select Allow for your device to access the Microphone and Camera for your visit. You will then be connected, and your provider will be with you shortly.  **If they have any issues connecting, or need assistance please contact MyChart service desk [phone])**  **If using a computer, in order to ensure the best quality for their visit they will need to use either of the following Internet Browsers: [employer], or Google Chrome**  IF USING DOXIMITY or DOXY.ME - The patient will receive a link just prior to their visit by text.     FULL LENGTH CONSENT FOR TELE-HEALTH VISIT   I hereby voluntarily request, consent and authorize [facility] and its employed or contracted physicians, physician assistants, nurse practitioners or other licensed health care professionals (the Practitioner), to provide me with telemedicine health care services (the "Services") as deemed necessary by the treating Practitioner. I acknowledge and consent to receive the Services by the Practitioner via telemedicine. I understand that the telemedicine visit will involve communicating with the Practitioner through live audiovisual communication technology and the disclosure of certain medical information by electronic transmission. I acknowledge that I have been given the opportunity to request an in-person assessment or other available alternative prior to the telemedicine visit and am voluntarily participating in the telemedicine visit.  I understand that I have the right to withhold or withdraw my consent to the use of telemedicine in the course of my care at any time, without affecting my right  to future care or treatment, and that the Practitioner or I may terminate the telemedicine visit at any time. I understand that I have the right to inspect all information obtained and/or recorded in the course of the telemedicine visit and may receive copies of available information for a reasonable fee.  I understand that some of the potential risks of receiving the Services via telemedicine include:  Marland Kitchen Delay or interruption in medical evaluation due to technological equipment failure or disruption; . Information transmitted may not be sufficient (e.g. poor resolution of images) to allow for appropriate medical decision making by the Practitioner; and/or  . In rare instances, security protocols could fail, causing a breach of personal health information.  Furthermore, I acknowledge that it is my responsibility to provide information about my medical history, conditions and care that is complete and accurate to the best of my ability. I acknowledge that Practitioner's advice, recommendations, and/or decision may be based on factors not within their control, such as incomplete or inaccurate data provided by me or distortions of diagnostic images or specimens that may result from electronic transmissions. I understand that  the practice of medicine is not an exact science and that Practitioner makes no warranties or guarantees regarding treatment outcomes. I acknowledge that I will receive a copy of this consent concurrently upon execution via email to the email address I last provided but may also request a printed copy by calling the office of [facility].    I understand that my insurance will be billed for this visit.   I have read or had this consent read to me. . I understand the contents of this consent, which adequately explains the benefits and risks of the Services being provided via telemedicine.  . I have been provided ample opportunity to ask questions regarding this consent and the Services  and have had my questions answered to my satisfaction. . I give my informed consent for the services to be provided through the use of telemedicine in my medical care  By participating in this telemedicine visit I agree to the above.

## 2018-10-25 NOTE — Telephone Encounter (Signed)
Left message to return call  To office.

## 2018-11-07 ENCOUNTER — Telehealth (INDEPENDENT_AMBULATORY_CARE_PROVIDER_SITE_OTHER): Payer: Medicaid Other | Admitting: Cardiovascular Disease

## 2018-11-07 ENCOUNTER — Encounter: Payer: Self-pay | Admitting: Cardiovascular Disease

## 2018-11-07 ENCOUNTER — Other Ambulatory Visit: Payer: Self-pay

## 2018-11-07 VITALS — BP 126/84 | HR 78 | Ht 66.0 in | Wt 158.0 lb

## 2018-11-07 DIAGNOSIS — I059 Rheumatic mitral valve disease, unspecified: Secondary | ICD-10-CM

## 2018-11-07 NOTE — Addendum Note (Signed)
Addended by: Lamar Laundry on: 11/07/2018 03:27 PM   Modules accepted: Orders

## 2018-11-07 NOTE — Patient Instructions (Signed)
Medication Instructions:  No new medications If you need a refill on your cardiac medications before your next appointment, please call your pharmacy.   Lab work: None If you have labs (blood work) drawn today and your tests are completely normal, you will receive your results only by: Marland Kitchen MyChart Message (if you have MyChart) OR . A paper copy in the mail If you have any lab test that is abnormal or we need to change your treatment, we will call you to review the results.  Testing/Procedures: Schedule echocardiogram for mitral regurgitation  Follow-Up: At [facility], you and your health needs are our priority.  As part of our continuing mission to provide you with exceptional heart care, we have created designated Provider Care Teams.  These Care Teams include your primary Cardiologist (physician) and Advanced Practice Providers (APPs -  Physician Assistants and Nurse Practitioners) who all work together to provide you with the care you need, when you need it. You will need a follow up appointment in 1 years.  Please call our office 2 months in advance to schedule this appointment.  You may see Kathlyn Sacramento, MD or one of the following Advanced Practice Providers on your designated Care Team:   Murray Hodgkins, NP Christell Faith, PA-C . Marrianne Mood, PA-C

## 2018-11-07 NOTE — Progress Notes (Signed)
Virtual Visit via Video Note   This visit type was conducted due to national recommendations for restrictions regarding the COVID-19 Pandemic (e.g. social distancing) in an effort to limit this patient's exposure and mitigate transmission in our community.  Due to her co-morbid illnesses, this patient is at least at moderate risk for complications without adequate follow up.  This format is felt to be most appropriate for this patient at this time.  All issues noted in this document were discussed and addressed.  A limited physical exam was performed with this format.  Please refer to the patient's chart for her consent to telehealth for [facility].   Date:  11/07/2018   ID:  Alyssa Bautista, DOB 1970/07/13, MRN [id number]  Patient Location: Home Provider Location: Office  PCP:  Einar Pheasant, MD  Cardiologist:  Kathlyn Sacramento, MD  Electrophysiologist:  None   Evaluation Performed:  Follow-Up Visit  Chief Complaint: Follow-up regarding mitral regurgitation  History of Present Illness:    Alyssa Bautista is a 48 y.o. female who was seen via telemedicine for follow-up regarding mitral valve prolapse with moderate mitral regurgitation.  We initially started with video that the connection was optimal and thus switched to a phone.  She has chronic medical conditions that include migraine headaches, seizures, depression and abnormal pap smear requiring hysterectomy.  Most recent echocardiogram in March 2019 showed normal LV systolic function, prolapse of both leaflets with moderate mitral regurgitation .  Left atrium was mildly dilated with normal pulmonary pressure.  She had a presyncopal episode last year that was thought to be vasovagal.  2-week outpatient monitor showed no evidence of arrhythmia.   She has been doing well with no recent chest pain, shortness of breath or palpitations. 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 The patient does not have  symptoms concerning for COVID-19 infection (fever, chills, cough, or new shortness of breath).    Past Medical History:  Diagnosis Date  . Anxiety   . Arthritis   . Depression   . Environmental allergies   . Gestational diabetes   . History of chicken pox   . Migraines   . MVP (mitral valve prolapse)   . Seizures ([place])    Past Surgical History:  Procedure Laterality Date  . ABDOMINAL HYSTERECTOMY  02/2011   abnormal pap (stage III dysplasia).   ovaries not removed.  Marland Kitchen abscess removal  2007   cervix   . DILATION AND CURETTAGE OF UTERUS  2002, 2005  . LEEP  2008  . WISDOM TOOTH EXTRACTION  2003     Current Meds  Medication Sig  . aspirin 325 MG EC tablet Take 325 mg by mouth every 6 (six) hours as needed for pain.  . cyanocobalamin (,VITAMIN B-12,) 1000 MCG/ML injection Inject into the muscle every 30 (thirty) days.   Marland Kitchen escitalopram (LEXAPRO) 10 MG tablet Take one and one half tablets by mouth daily.  Marland Kitchen escitalopram (LEXAPRO) 10 MG tablet Take one and one half tablets by mouth daily.  Marland Kitchen levocetirizine (XYZAL) 5 MG tablet TAKE 1 TABLET(5 MG) BY MOUTH EVERY EVENING  . LORazepam (ATIVAN) 0.5 MG tablet 1/2 tablet q day prn  . montelukast (SINGULAIR) 10 MG tablet TAKE 1 TABLET(10 MG) BY MOUTH AT BEDTIME  . phenytoin (DILANTIN) 100 MG ER capsule Take 1 capsule (100 mg total) by mouth 3 (three) times daily.  . SUMAtriptan (IMITREX) 100 MG tablet Take 100 mg by mouth daily as needed for migraine. May  repeat in 2 hours if headache persists or recurs.  Marland Kitchen SYRINGE-NEEDLE, DISP, 3 ML (B-D 3CC LUER-LOK SYR 25GX1") 25G X 1" 3 ML MISC USE AS DIRECTED  . Vitamin D, Ergocalciferol, (DRISDOL) 50000 units CAPS capsule Take 1 capsule (50,000 Units total) by mouth every 7 (seven) days.     Allergies:   Penicillins, Azithromycin, Dilantin [phenytoin], and Clindamycin/lincomycin   Social History   Tobacco Use  . Smoking status: Never Smoker  . Smokeless tobacco: Never Used  Substance Use Topics   . Alcohol use: No    Alcohol/week: 0.0 standard drinks    Comment: rarely  . Drug use: No     Family Hx: The patient's family history includes Breast cancer in her maternal aunt; Diabetes in her paternal grandfather; Hypercholesterolemia in her father and mother; Prostate cancer in her paternal grandfather. There is no history of Colon cancer.  ROS:   Please see the history of present illness.     All other systems reviewed and are negative.   Prior CV studies:   The following studies were reviewed today:  Reviewed most recent echocardiogram from 2019  Labs/Other Tests and Data Reviewed:    EKG:  No ECG reviewed.  Recent Labs: No results found for requested labs within last 8760 hours.   Recent Lipid Panel Lab Results  Component Value Date/Time   CHOL 194 10/20/2017 08:44 AM   TRIG 98.0 10/20/2017 08:44 AM   HDL 67.40 10/20/2017 08:44 AM   CHOLHDL 3 10/20/2017 08:44 AM   LDLCALC 107 (H) 10/20/2017 08:44 AM    Wt Readings from Last 3 Encounters:  11/07/18 158 lb (71.7 kg)  02/14/18 157 lb 6 oz (71.4 kg)  10/20/17 154 lb 8 oz (70.1 kg)     Objective:    Vital Signs:  BP 126/84   Pulse 78   Ht 5\' 6"  (1.676 m)   Wt 158 lb (71.7 kg)   LMP 02/24/2011   BMI 25.50 kg/m    VITAL SIGNS:  reviewed  ASSESSMENT & PLAN:    1.  Mitral valve prolapse with moderate mitral regurgitation: I requested a follow-up echocardiogram.    She continues to be asymptomatic  2.  Previous presyncope: Thought to be vasovagal.  No recurrent episodes.  COVID-19 Education: The signs and symptoms of COVID-19 were discussed with the patient and how to seek care for testing (follow up with PCP or arrange E-visit).  The importance of social distancing was discussed today.  Time:   Today, I have spent 10 minutes with the patient with telehealth technology discussing the above problems.     Medication Adjustments/Labs and Tests Ordered: Current medicines are reviewed at length with the  patient today.  Concerns regarding medicines are outlined above.   Tests Ordered: No orders of the defined types were placed in this encounter.   Medication Changes: No orders of the defined types were placed in this encounter.   Follow Up:  In Person in 1 year(s)  Signed, Kathlyn Sacramento, MD  11/07/2018 3:08 PM    [facility]

## 2018-11-09 DIAGNOSIS — Z20828 Contact with and (suspected) exposure to other viral communicable diseases: Secondary | ICD-10-CM | POA: Diagnosis not present

## 2018-11-20 ENCOUNTER — Other Ambulatory Visit: Payer: Self-pay | Admitting: Internal Medicine

## 2018-11-20 MED ORDER — ESCITALOPRAM OXALATE 10 MG PO TABS: ORAL_TABLET | ORAL | 5 refills | Status: DC

## 2018-11-30 ENCOUNTER — Ambulatory Visit (INDEPENDENT_AMBULATORY_CARE_PROVIDER_SITE_OTHER): Payer: Medicaid Other

## 2018-11-30 ENCOUNTER — Other Ambulatory Visit: Payer: Self-pay

## 2018-11-30 DIAGNOSIS — I059 Rheumatic mitral valve disease, unspecified: Secondary | ICD-10-CM | POA: Diagnosis not present

## 2018-12-04 ENCOUNTER — Encounter: Payer: Medicaid Other | Admitting: Internal Medicine

## 2018-12-14 ENCOUNTER — Other Ambulatory Visit: Payer: Self-pay

## 2018-12-14 DIAGNOSIS — I341 Nonrheumatic mitral (valve) prolapse: Secondary | ICD-10-CM

## 2019-01-27 ENCOUNTER — Telehealth: Payer: 59 | Admitting: Family

## 2019-01-27 DIAGNOSIS — M545 Low back pain, unspecified: Secondary | ICD-10-CM

## 2019-01-27 MED ORDER — BACLOFEN 10 MG PO TABS: 10.0000 mg | ORAL_TABLET | Freq: Three times a day (TID) | ORAL | 0 refills | Status: DC

## 2019-01-27 MED ORDER — NAPROXEN 500 MG PO TABS: 500.0000 mg | ORAL_TABLET | Freq: Two times a day (BID) | ORAL | 0 refills | Status: DC

## 2019-01-27 NOTE — Progress Notes (Signed)
We are sorry that you are not feeling well.  Here is how we plan to help!  Based on what you have shared with me it looks like you mostly have acute back pain.  Acute back pain is defined as musculoskeletal pain that can resolve in 1-3 weeks with conservative treatment.  I have prescribed Naprosyn 500 mg twice a day non-steroid anti-inflammatory (NSAID) as well as Baclofen 10 mg every eight hours as needed which is a muscle relaxer  Some patients experience stomach irritation or in increased heartburn with anti-inflammatory drugs.  Please keep in mind that muscle relaxer's can cause fatigue and should not be taken while at work or driving.  Back pain is very common.  The pain often gets better over time.  The cause of back pain is usually not dangerous.  Most people can learn to manage their back pain on their own.   If your pain does not improve or worsens, you need to be seen face to face.    Home Care  Stay active.  Start with short walks on flat ground if you can.  Try to walk farther each day.  Do not sit, drive or stand in one place for more than 30 minutes.  Do not stay in bed.  Do not avoid exercise or work.  Activity can help your back heal faster.  Be careful when you bend or lift an object.  Bend at your knees, keep the object close to you, and do not twist.  Sleep on a firm mattress.  Lie on your side, and bend your knees.  If you lie on your back, put a pillow under your knees.  Only take medicines as told by your doctor.  Put ice on the injured area.  Put ice in a plastic bag  Place a towel between your skin and the bag  Leave the ice on for 15-20 minutes, 3-4 times a day for the first 2-3 days. 210 After that, you can switch between ice and heat packs.  Ask your doctor about back exercises or massage.  Avoid feeling anxious or stressed.  Find good ways to deal with stress, such as exercise.  Get Help Right Way If:  Your pain does not go away with rest or  medicine.  Your pain does not go away in 1 week.  You have new problems.  You do not feel well.  The pain spreads into your legs.  You cannot control when you poop (bowel movement) or pee (urinate)  You feel sick to your stomach (nauseous) or throw up (vomit)  You have belly (abdominal) pain.  You feel like you may pass out (faint).  If you develop a fever.  Make Sure you:  Understand these instructions.  Will watch your condition  Will get help right away if you are not doing well or get worse.  Your e-visit answers were reviewed by a board certified advanced clinical practitioner to complete your personal care plan.  Depending on the condition, your plan could have included both over the counter or prescription medications.  If there is a problem please reply  once you have received a response from your provider.  Your safety is important to [place].  If you have drug allergies check your prescription carefully.    You can use MyChart to ask questions about today's visit, request a non-urgent call back, or ask for a work or school excuse for 24 hours related to this e-Visit. If it has been  greater than 24 hours you will need to follow up with your provider, or enter a new e-Visit to address those concerns.  You will get an e-mail in the next two days asking about your experience.  I hope that your e-visit has been valuable and will speed your recovery. Thank you for using e-visits.

## 2019-02-14 DIAGNOSIS — Z23 Encounter for immunization: Secondary | ICD-10-CM | POA: Diagnosis not present

## 2019-03-14 ENCOUNTER — Other Ambulatory Visit: Payer: Self-pay | Admitting: Family

## 2019-03-14 NOTE — Progress Notes (Signed)
Approximately 5 minutes was spent documenting and reviewing patient's chart.   

## 2019-04-17 ENCOUNTER — Other Ambulatory Visit: Payer: Self-pay | Admitting: Internal Medicine

## 2019-04-17 DIAGNOSIS — Z20822 Contact with and (suspected) exposure to covid-19: Secondary | ICD-10-CM

## 2019-04-18 LAB — NOVEL CORONAVIRUS, NAA: SARS-CoV-2, NAA: NOT DETECTED

## 2019-07-17 ENCOUNTER — Ambulatory Visit: Payer: 59 | Attending: Internal Medicine

## 2019-07-17 DIAGNOSIS — Z23 Encounter for immunization: Secondary | ICD-10-CM | POA: Insufficient documentation

## 2019-07-17 NOTE — Progress Notes (Signed)
   Covid-19 Vaccination Clinic  Name:  Alyssa Bautista    MRN: [id number] DOB: 25-Dec-1970  07/17/2019  [name] was observed post Covid-19 immunization for 15 minutes without incident. She was provided with Vaccine Information Sheet and instruction to access the V-Safe system.   [name] was instructed to call 911 with any severe reactions post vaccine: Marland Kitchen Difficulty breathing  . Swelling of face and throat  . A fast heartbeat  . A bad rash all over body  . Dizziness and weakness   Immunizations Administered    Name Date Dose VIS Date Route   Pfizer COVID-19 Vaccine 07/17/2019  5:17 PM 0.3 mL 04/20/2019 Intramuscular   Manufacturer: [employer]   Lot: [id number]   [place]: [id number]

## 2019-07-19 DIAGNOSIS — G43719 Chronic migraine without aura, intractable, without status migrainosus: Secondary | ICD-10-CM | POA: Diagnosis not present

## 2019-07-19 DIAGNOSIS — E531 Pyridoxine deficiency: Secondary | ICD-10-CM | POA: Diagnosis not present

## 2019-07-19 DIAGNOSIS — E559 Vitamin D deficiency, unspecified: Secondary | ICD-10-CM | POA: Diagnosis not present

## 2019-07-19 DIAGNOSIS — Z79899 Other long term (current) drug therapy: Secondary | ICD-10-CM | POA: Diagnosis not present

## 2019-07-19 DIAGNOSIS — G40109 Localization-related (focal) (partial) symptomatic epilepsy and epileptic syndromes with simple partial seizures, not intractable, without status epilepticus: Secondary | ICD-10-CM | POA: Diagnosis not present

## 2019-07-19 DIAGNOSIS — E538 Deficiency of other specified B group vitamins: Secondary | ICD-10-CM | POA: Diagnosis not present

## 2019-07-20 ENCOUNTER — Ambulatory Visit: Payer: Medicaid Other

## 2019-08-08 ENCOUNTER — Ambulatory Visit: Payer: 59 | Attending: Internal Medicine

## 2019-08-08 DIAGNOSIS — Z23 Encounter for immunization: Secondary | ICD-10-CM

## 2019-08-08 NOTE — Progress Notes (Signed)
   Covid-19 Vaccination Clinic  Name:  Alyssa Bautista    MRN: [id number] DOB: 07-Sep-1970  08/08/2019  [name] was observed post Covid-19 immunization for 15 minutes without incident. She was provided with Vaccine Information Sheet and instruction to access the V-Safe system.   [name] was instructed to call 911 with any severe reactions post vaccine: Marland Kitchen Difficulty breathing  . Swelling of face and throat  . A fast heartbeat  . A bad rash all over body  . Dizziness and weakness   Immunizations Administered    Name Date Dose VIS Date Route   Pfizer COVID-19 Vaccine 08/08/2019  1:37 PM 0.3 mL 04/20/2019 Intramuscular   Manufacturer: [place]   Lot: [id number]   [place]: [id number]

## 2019-08-13 ENCOUNTER — Encounter: Payer: Self-pay | Admitting: Internal Medicine

## 2019-08-13 ENCOUNTER — Other Ambulatory Visit (HOSPITAL_COMMUNITY)
Admission: RE | Admit: 2019-08-13 | Discharge: 2019-08-13 | Disposition: A | Discharge status: Home / Self Care | Payer: 59 | Source: Ambulatory Visit | Attending: Internal Medicine | Admitting: Internal Medicine

## 2019-08-13 ENCOUNTER — Ambulatory Visit (INDEPENDENT_AMBULATORY_CARE_PROVIDER_SITE_OTHER): Payer: 59 | Admitting: Internal Medicine

## 2019-08-13 ENCOUNTER — Other Ambulatory Visit: Payer: Self-pay

## 2019-08-13 VITALS — BP 112/70 | HR 63 | Temp 97.3°F | Resp 16 | Ht 66.0 in | Wt 160.0 lb

## 2019-08-13 DIAGNOSIS — F32A Depression, unspecified: Secondary | ICD-10-CM

## 2019-08-13 DIAGNOSIS — F439 Reaction to severe stress, unspecified: Secondary | ICD-10-CM | POA: Diagnosis not present

## 2019-08-13 DIAGNOSIS — Z0000 Encounter for general adult medical examination without abnormal findings: Secondary | ICD-10-CM

## 2019-08-13 DIAGNOSIS — R69 Illness, unspecified: Secondary | ICD-10-CM | POA: Diagnosis not present

## 2019-08-13 DIAGNOSIS — G43809 Other migraine, not intractable, without status migrainosus: Secondary | ICD-10-CM

## 2019-08-13 DIAGNOSIS — G40909 Epilepsy, unspecified, not intractable, without status epilepticus: Secondary | ICD-10-CM

## 2019-08-13 DIAGNOSIS — Z124 Encounter for screening for malignant neoplasm of cervix: Secondary | ICD-10-CM | POA: Insufficient documentation

## 2019-08-13 DIAGNOSIS — Z13220 Encounter for screening for lipoid disorders: Secondary | ICD-10-CM

## 2019-08-13 DIAGNOSIS — F329 Major depressive disorder, single episode, unspecified: Secondary | ICD-10-CM

## 2019-08-13 DIAGNOSIS — E559 Vitamin D deficiency, unspecified: Secondary | ICD-10-CM

## 2019-08-13 DIAGNOSIS — E538 Deficiency of other specified B group vitamins: Secondary | ICD-10-CM | POA: Diagnosis not present

## 2019-08-13 LAB — COMPREHENSIVE METABOLIC PANEL
ALT: 21 U/L (ref 0–35)
AST: 15 U/L (ref 0–37)
Albumin: 4.5 g/dL (ref 3.5–5.2)
Alkaline Phosphatase: 114 U/L (ref 39–117)
BUN: 8 mg/dL (ref 6–23)
CO2: 30 mEq/L (ref 19–32)
Calcium: 9.1 mg/dL (ref 8.4–10.5)
Chloride: 102 mEq/L (ref 96–112)
Creatinine, Ser: 0.78 mg/dL (ref 0.40–1.20)
GFR: 79.33 mL/min (ref >60.00)
Glucose, Bld: 85 mg/dL (ref 70–99)
Potassium: 4.1 mEq/L (ref 3.5–5.1)
Sodium: 139 mEq/L (ref 135–145)
Total Bilirubin: 0.3 mg/dL (ref 0.2–1.2)
Total Protein: 7.6 g/dL (ref 6.0–8.3)

## 2019-08-13 LAB — LIPID PANEL
Cholesterol: 204 mg/dL — ABNORMAL HIGH (ref 0–200)
HDL: 62.4 mg/dL (ref >39.00)
LDL Cholesterol: 119 mg/dL — ABNORMAL HIGH (ref 0–99)
NonHDL: 141.97
Total CHOL/HDL Ratio: 3
Triglycerides: 116 mg/dL (ref 0.0–149.0)
VLDL: 23.2 mg/dL (ref 0.0–40.0)

## 2019-08-13 LAB — TSH: TSH: 1.87 u[IU]/mL (ref 0.35–4.50)

## 2019-08-13 NOTE — Assessment & Plan Note (Addendum)
Physical today 08/13/19.  PAP 08/13/19.  Mammogram - will schedule.  Guidelines changed - colonoscopy 47.

## 2019-08-13 NOTE — Progress Notes (Signed)
Patient ID: Alyssa Bautista, female   DOB: 01-28-1971, 49 y.o.   MRN: [id number]   Subjective:    Patient ID: Alyssa Bautista, female    DOB: 08-Aug-1970, 49 y.o.   MRN: [id number]  HPI This visit occurred during the SARS-CoV-2 public health emergency.  Safety protocols were in place, including screening questions prior to the visit, additional usage of staff PPE, and extensive cleaning of exam room while observing appropriate contact time as indicated for disinfecting solutions.  Patient here for her physical exam.  She reports she is doing relatively well.  Seeing Dr Manuella Ghazi for f/u seizures and migraine headaches.  On dilantin.  Headaches controlled.  Has imitrex to take as needed.  Increased stress.  Overall she feels she is handling things relatively well.  On lexapro.  Does not feel needs any further intervention.  Has adjusted her diet.  Lost weight.  No soda.  No chest pain or sob reported.  No abdominal papin.  Bowels moving.     Past Medical History:  Diagnosis Date  . Anxiety   . Arthritis   . Depression   . Environmental allergies   . Gestational diabetes   . History of chicken pox   . Migraines   . MVP (mitral valve prolapse)   . Seizures ([place])    Past Surgical History:  Procedure Laterality Date  . ABDOMINAL HYSTERECTOMY  02/2011   abnormal pap (stage III dysplasia).   ovaries not removed.  Marland Kitchen abscess removal  2007   cervix   . DILATION AND CURETTAGE OF UTERUS  2002, 2005  . LEEP  2008  . WISDOM TOOTH EXTRACTION  2003   Family History  Problem Relation Age of Onset  . Hypercholesterolemia Mother   . Hypercholesterolemia Father   . Diabetes Paternal Grandfather   . Prostate cancer Paternal Grandfather   . Breast cancer Maternal Aunt        50's  . Colon cancer Neg Hx    Social History   Socioeconomic History  . Marital status: Divorced    Spouse name: Not on file  . Number of children: 2  . Years of education: Not on file  . Highest education  level: Not on file  Occupational History  . Not on file  Tobacco Use  . Smoking status: Never Smoker  . Smokeless tobacco: Never Used  Substance and Sexual Activity  . Alcohol use: No    Alcohol/week: 0.0 standard drinks    Comment: rarely  . Drug use: No  . Sexual activity: Not on file  Other Topics Concern  . Not on file  Social History Narrative  . Not on file   Social Determinants of Health   Financial Resource Strain:   . Difficulty of Paying Living Expenses:   Food Insecurity:   . Worried About Charity fundraiser in the Last Year:   . Arboriculturist in the Last Year:   Transportation Needs:   . Film/video editor (Medical):   Marland Kitchen Lack of Transportation (Non-Medical):   Physical Activity:   . Days of Exercise per Week:   . Minutes of Exercise per Session:   Stress:   . Feeling of Stress :   Social Connections:   . Frequency of Communication with Friends and Family:   . Frequency of Social Gatherings with Friends and Family:   . Attends Religious Services:   . Active Member of Clubs or Organizations:   . Attends Archivist  Meetings:   Marland Kitchen Marital Status:     Outpatient Encounter Medications as of 08/13/2019  Medication Sig  . SUMAtriptan (IMITREX) 50 MG tablet Take by mouth.  Marland Kitchen acetaminophen (TYLENOL) 500 MG tablet Take 500 mg by mouth as needed.  Marland Kitchen aspirin 325 MG EC tablet Take 325 mg by mouth every 6 (six) hours as needed for pain.  . cyanocobalamin (,VITAMIN B-12,) 1000 MCG/ML injection Inject into the muscle every 30 (thirty) days.   Marland Kitchen escitalopram (LEXAPRO) 10 MG tablet Take one and one half tablets by mouth daily.  Marland Kitchen levocetirizine (XYZAL) 5 MG tablet TAKE 1 TABLET(5 MG) BY MOUTH EVERY EVENING  . LORazepam (ATIVAN) 0.5 MG tablet 1/2 tablet q day prn  . montelukast (SINGULAIR) 10 MG tablet TAKE 1 TABLET(10 MG) BY MOUTH AT BEDTIME (Patient not taking: Reported on 11/07/2018)  . phenytoin (DILANTIN) 100 MG ER capsule Take 1 capsule (100 mg total) by  mouth 3 (three) times daily.  . promethazine (PHENERGAN) 25 MG tablet Take by mouth.  . SYRINGE-NEEDLE, DISP, 3 ML (B-D 3CC LUER-LOK SYR 25GX1") 25G X 1" 3 ML MISC USE AS DIRECTED  . [DISCONTINUED] baclofen (LIORESAL) 10 MG tablet Take 1 tablet (10 mg total) by mouth 3 (three) times daily.  . [DISCONTINUED] escitalopram (LEXAPRO) 10 MG tablet Take one and one half tablets by mouth daily.  . [DISCONTINUED] montelukast (SINGULAIR) 10 MG tablet TAKE 1 TABLET(10 MG) BY MOUTH AT BEDTIME  . [DISCONTINUED] naproxen (NAPROSYN) 500 MG tablet Take 1 tablet (500 mg total) by mouth 2 (two) times daily with a meal.  . [DISCONTINUED] nitrofurantoin, macrocrystal-monohydrate, (MACROBID) 100 MG capsule Take 1 capsule (100 mg total) by mouth 2 (two) times daily. 1 po BId  . [DISCONTINUED] SUMAtriptan (IMITREX) 100 MG tablet Take 100 mg by mouth daily as needed for migraine. May repeat in 2 hours if headache persists or recurs.  . [DISCONTINUED] Vitamin D, Ergocalciferol, (DRISDOL) 50000 units CAPS capsule Take 1 capsule (50,000 Units total) by mouth every 7 (seven) days.   No facility-administered encounter medications on file as of 08/13/2019.    Review of Systems  Constitutional: Negative for appetite change and unexpected weight change.  HENT: Negative for congestion and sinus pressure.   Eyes: Negative for pain and visual disturbance.  Respiratory: Negative for cough, chest tightness and shortness of breath.   Cardiovascular: Negative for chest pain, palpitations and leg swelling.  Gastrointestinal: Negative for abdominal pain, diarrhea, nausea and vomiting.  Genitourinary: Negative for difficulty urinating and dysuria.  Musculoskeletal: Negative for joint swelling and myalgias.  Skin: Negative for color change and rash.  Neurological: Negative for dizziness, light-headedness and headaches.  Hematological: Negative for adenopathy. Does not bruise/bleed easily.  Psychiatric/Behavioral: Negative for  agitation and dysphoric mood.       Objective:    Physical Exam Vitals reviewed.  Constitutional:      General: She is not in acute distress.    Appearance: She is well-developed.  HENT:     Head: Normocephalic and atraumatic.     Right Ear: External ear normal.     Left Ear: External ear normal.  Eyes:     General: No scleral icterus.       Right eye: No discharge.        Left eye: No discharge.     Conjunctiva/sclera: Conjunctivae normal.  Neck:     Thyroid: No thyromegaly.  Cardiovascular:     Rate and Rhythm: Normal rate and regular rhythm.  Pulmonary:  Effort: No tachypnea, accessory muscle usage or respiratory distress.     Breath sounds: Normal breath sounds. No decreased breath sounds or wheezing.  Chest:     Breasts:        Right: No inverted nipple, mass, nipple discharge or tenderness (no axillary adenopathy).        Left: No inverted nipple, mass, nipple discharge or tenderness (no axilarry adenopathy).  Abdominal:     General: Bowel sounds are normal.     Palpations: Abdomen is soft.     Tenderness: There is no abdominal tenderness.  Genitourinary:    Comments: Normal external genitalia.  Vaginal vault without lesions.  Cervix identified.  Pap smear performed.  Could not appreciate any adnexal masses or tenderness.   Musculoskeletal:        General: No swelling or tenderness.     Cervical back: Neck supple. No tenderness.  Lymphadenopathy:     Cervical: No cervical adenopathy.  Skin:    Findings: No erythema or rash.  Neurological:     Mental Status: She is alert and oriented to person, place, and time.  Psychiatric:        Mood and Affect: Mood normal.        Behavior: Behavior normal.     BP 112/70   Pulse 63   Temp (!) 97.3 F (36.3 C)   Resp 16   Ht 5\' 6"  (1.676 m)   Wt 160 lb (72.6 kg)   LMP 02/24/2011   SpO2 98%   BMI 25.82 kg/m  Wt Readings from Last 3 Encounters:  08/13/19 160 lb (72.6 kg)  11/07/18 158 lb (71.7 kg)  04/15/16  146 lb 2 oz (66.3 kg)     Lab Results  Component Value Date   WBC 4.8 10/20/2017   HGB 13.3 10/20/2017   HCT 39.2 10/20/2017   PLT 308.0 10/20/2017   GLUCOSE 85 08/13/2019   CHOL 204 (H) 08/13/2019   TRIG 116.0 08/13/2019   HDL 62.40 08/13/2019   LDLCALC 119 (H) 08/13/2019   ALT 21 08/13/2019   AST 15 08/13/2019   NA 139 08/13/2019   K 4.1 08/13/2019   CL 102 08/13/2019   CREATININE 0.78 08/13/2019   BUN 8 08/13/2019   CO2 30 08/13/2019   TSH 1.87 08/13/2019       Assessment & Plan:   Problem List Items Addressed This Visit    B12 deficiency    Continues on B12 injections.  Recheck B12 level with next labs.       Depression    On lexapro.  Stable.       Health care maintenance    Physical today 08/13/19.  PAP 08/13/19.  Mammogram - will schedule.  Guidelines changed - colonoscopy 47.       Migraine headache - Primary    Followed by neurology.  On imitrex and phenergan prn.  Follow.  Stable.       Relevant Medications   SUMAtriptan (IMITREX) 50 MG tablet   Seizure disorder (HCC)    On dilantin.  Stable.  Followed by neurology.        Relevant Medications   SUMAtriptan (IMITREX) 50 MG tablet   Other Relevant Orders   Comprehensive metabolic panel (Completed)   TSH (Completed)   Stress    Increased stress as outlined.  Overall handling things relatively well.  On lexapro..        Vitamin D deficiency    Follow vitamin D level.  Other Visit Diagnoses    Screening cholesterol level       Relevant Orders   Lipid panel (Completed)   Cervical cancer screening       Relevant Orders   Cytology - PAP( [place]) (Completed)       Einar Pheasant, MD

## 2019-08-14 LAB — CYTOLOGY - PAP
Adequacy: ABSENT
Comment: NEGATIVE
Diagnosis: NEGATIVE
High risk HPV: NEGATIVE

## 2019-08-15 ENCOUNTER — Encounter: Payer: Self-pay | Admitting: Internal Medicine

## 2019-08-19 ENCOUNTER — Encounter: Payer: Self-pay | Admitting: Internal Medicine

## 2019-08-19 NOTE — Assessment & Plan Note (Signed)
On lexapro.  Stable.  ?

## 2019-08-19 NOTE — Assessment & Plan Note (Signed)
Continues on B12 injections.  Recheck B12 level with next labs.

## 2019-08-19 NOTE — Assessment & Plan Note (Signed)
On dilantin.  Stable.  Followed by neurology.

## 2019-08-19 NOTE — Assessment & Plan Note (Signed)
Followed by neurology.  On imitrex and phenergan prn.  Follow.  Stable.

## 2019-08-19 NOTE — Assessment & Plan Note (Signed)
Increased stress as outlined.  Overall handling things relatively well.  On lexapro.Marland Kitchen

## 2019-08-19 NOTE — Assessment & Plan Note (Signed)
Follow vitamin D level.  

## 2019-09-13 ENCOUNTER — Encounter: Payer: Self-pay | Admitting: Internal Medicine

## 2019-09-13 ENCOUNTER — Other Ambulatory Visit: Payer: Self-pay | Admitting: Internal Medicine

## 2019-09-15 MED ORDER — LORAZEPAM 0.5 MG PO TABS: ORAL_TABLET | ORAL | 0 refills | Status: DC

## 2019-09-15 MED ORDER — CYANOCOBALAMIN 1000 MCG/ML IJ SOLN: 1000.0000 ug | INTRAMUSCULAR | 0 refills | Status: DC

## 2019-09-15 MED ORDER — "BD LUER-LOK SYRINGE 25G X 1"" 3 ML MISC": 0 refills | Status: DC

## 2019-09-15 NOTE — Telephone Encounter (Signed)
rx sent in for lorazepam and B12.

## 2019-11-16 ENCOUNTER — Other Ambulatory Visit: Payer: Self-pay | Admitting: Internal Medicine

## 2019-12-06 ENCOUNTER — Ambulatory Visit: Payer: 59 | Admitting: Cardiovascular Disease

## 2019-12-06 ENCOUNTER — Other Ambulatory Visit: Payer: 59

## 2019-12-07 ENCOUNTER — Encounter: Payer: Self-pay | Admitting: Cardiovascular Disease

## 2019-12-13 ENCOUNTER — Ambulatory Visit: Payer: 59 | Admitting: Internal Medicine

## 2020-05-13 ENCOUNTER — Other Ambulatory Visit: Payer: Self-pay | Admitting: Internal Medicine

## 2020-06-04 ENCOUNTER — Encounter: Payer: Self-pay | Admitting: *Deleted

## 2020-07-02 ENCOUNTER — Encounter: Payer: Self-pay | Admitting: Internal Medicine

## 2020-07-03 NOTE — Telephone Encounter (Signed)
Pt is going to urgent care to be tested. Home test was negative.

## 2020-07-15 ENCOUNTER — Telehealth (INDEPENDENT_AMBULATORY_CARE_PROVIDER_SITE_OTHER): Payer: 59 | Admitting: Internal Medicine

## 2020-07-15 ENCOUNTER — Encounter: Payer: Self-pay | Admitting: Internal Medicine

## 2020-07-15 VITALS — Ht 66.0 in | Wt 164.0 lb

## 2020-07-15 DIAGNOSIS — F439 Reaction to severe stress, unspecified: Secondary | ICD-10-CM

## 2020-07-15 DIAGNOSIS — Z1231 Encounter for screening mammogram for malignant neoplasm of breast: Secondary | ICD-10-CM

## 2020-07-15 DIAGNOSIS — G40909 Epilepsy, unspecified, not intractable, without status epilepticus: Secondary | ICD-10-CM

## 2020-07-15 DIAGNOSIS — R69 Illness, unspecified: Secondary | ICD-10-CM | POA: Diagnosis not present

## 2020-07-15 DIAGNOSIS — G43809 Other migraine, not intractable, without status migrainosus: Secondary | ICD-10-CM

## 2020-07-15 DIAGNOSIS — Z9109 Other allergy status, other than to drugs and biological substances: Secondary | ICD-10-CM | POA: Diagnosis not present

## 2020-07-15 MED ORDER — LORAZEPAM 0.5 MG PO TABS: ORAL_TABLET | ORAL | 0 refills | Status: DC

## 2020-07-15 MED ORDER — MONTELUKAST SODIUM 10 MG PO TABS: ORAL_TABLET | ORAL | 4 refills | Status: DC

## 2020-07-15 MED ORDER — ESCITALOPRAM OXALATE 10 MG PO TABS: 15.0000 mg | ORAL_TABLET | Freq: Every day | ORAL | 2 refills | Status: DC

## 2020-07-15 MED ORDER — LEVOCETIRIZINE DIHYDROCHLORIDE 5 MG PO TABS: ORAL_TABLET | ORAL | 4 refills | Status: DC

## 2020-07-15 NOTE — Progress Notes (Signed)
Patient ID: Alyssa Bautista, female   DOB: July 05, 1970, 50 y.o.   MRN: [id number]   Virtual Visit via video Note  This visit type was conducted due to national recommendations for restrictions regarding the COVID-19 pandemic (e.g. social distancing).  This format is felt to be most appropriate for this patient at this time.  All issues noted in this document were discussed and addressed.  No physical exam was performed (except for noted visual exam findings with Video Visits).   I connected with Anette Riedel by a video enabled telemedicine application and verified that I am speaking with the correct person using two identifiers. Location patient: home Location provider: work  Persons participating in the virtual visit: patient, provider  The limitations, risks, security and privacy concerns of performing an evaluation and management service by video and the availability of in person appointments have been discussed.  It has also been discussed with the patient that there may be a patient responsible charge related to this service. The patient expressed understanding and agreed to proceed.   Reason for visit:  Follow up appt  HPI: Follow up regarding increased stress and headaches.  She reports she is doing relatively well.  Still with increased stress.  Busy at work.  She has been able to see her father.  Seeing a therapist - virtual visits.  Headaches overall appear to be stable.  Seeing neurology.  Has imitrex/ubrelvy if needed.  Has migraine APP.  No seizures.  On dilantin.  Has had itchy watery eyes and sneezing.  Using nasal spray.  Continue xyzal and singulair.  Tries to stay active.  No chest pain or sob reported.  No abdominal pain or bowel change reported.    ROS: See pertinent positives and negatives per HPI.  Past Medical History:  Diagnosis Date  . Anxiety   . Arthritis   . Depression   . Environmental allergies   . Gestational diabetes   . History of chicken pox    . Migraines   . MVP (mitral valve prolapse)   . Seizures ([place])     Past Surgical History:  Procedure Laterality Date  . ABDOMINAL HYSTERECTOMY  02/2011   abnormal pap (stage III dysplasia).   ovaries not removed.  Marland Kitchen abscess removal  2007   cervix   . DILATION AND CURETTAGE OF UTERUS  2002, 2005  . LEEP  2008  . WISDOM TOOTH EXTRACTION  2003    Family History  Problem Relation Age of Onset  . Hypercholesterolemia Mother   . Hypercholesterolemia Father   . Diabetes Paternal Grandfather   . Prostate cancer Paternal Grandfather   . Breast cancer Maternal Aunt        50's  . Colon cancer Neg Hx     SOCIAL HX: reviewed.    Current Outpatient Medications:  .  acetaminophen (TYLENOL) 500 MG tablet, Take 500 mg by mouth as needed., Disp: , Rfl:  .  aspirin 325 MG EC tablet, Take 325 mg by mouth every 6 (six) hours as needed for pain., Disp: , Rfl:  .  cyanocobalamin (,VITAMIN B-12,) 1000 MCG/ML injection, Inject 1 mL (1,000 mcg total) into the muscle every 30 (thirty) days., Disp: 10 mL, Rfl: 0 .  phenytoin (DILANTIN) 100 MG ER capsule, Take 1 capsule (100 mg total) by mouth 3 (three) times daily., Disp: 90 capsule, Rfl: 0 .  promethazine (PHENERGAN) 25 MG tablet, Take by mouth., Disp: , Rfl:  .  SUMAtriptan (IMITREX) 50 MG tablet, Take  by mouth., Disp: , Rfl:  .  SYRINGE-NEEDLE, DISP, 3 ML (B-D 3CC LUER-LOK SYR 25GX1") 25G X 1" 3 ML MISC, USE AS DIRECTED, Disp: 20 each, Rfl: 0 .  escitalopram (LEXAPRO) 10 MG tablet, Take 1.5 tablets (15 mg total) by mouth daily., Disp: 45 tablet, Rfl: 2 .  levocetirizine (XYZAL) 5 MG tablet, TAKE 1 TABLET(5 MG) BY MOUTH EVERY EVENING, Disp: 30 tablet, Rfl: 4 .  LORazepam (ATIVAN) 0.5 MG tablet, 1/2 tablet q day prn, Disp: 30 tablet, Rfl: 0 .  montelukast (SINGULAIR) 10 MG tablet, TAKE 1 TABLET(10 MG) BY MOUTH AT BEDTIME, Disp: 30 tablet, Rfl: 4  EXAM:  GENERAL: alert, oriented, appears well and in no acute distress  HEENT: atraumatic,  conjunttiva clear, no obvious abnormalities on inspection of external nose and ears  NECK: normal movements of the head and neck  LUNGS: on inspection no signs of respiratory distress, breathing rate appears normal, no obvious gross SOB, gasping or wheezing  CV: no obvious cyanosis  PSYCH/NEURO: pleasant and cooperative, no obvious depression or anxiety, speech and thought processing grossly intact  ASSESSMENT AND PLAN:  Discussed the following assessment and plan:  Problem List Items Addressed This Visit    Environmental allergies    Continue singulair and xyzal.  Continue nasal spray.  Follow.       Migraine headache    Followed by neurology.  Has imitrex if needed.  Stable.       Relevant Medications   escitalopram (LEXAPRO) 10 MG tablet   Seizure disorder ([place])    Followed by neurology.  Continue dilantin.       Stress    Increased stress as outlined.  On lexapro.  Seeing a therapist.  Does not feel needs any further intervention at this time.  Follow.         Other Visit Diagnoses    Encounter for screening mammogram for malignant neoplasm of breast    -  Primary   Relevant Orders   MM 3D SCREEN BREAST BILATERAL       I discussed the assessment and treatment plan with the patient. The patient was provided an opportunity to ask questions and all were answered. The patient agreed with the plan and demonstrated an understanding of the instructions.   The patient was advised to call back or seek an in-person evaluation if the symptoms worsen or if the condition fails to improve as anticipated.   Einar Pheasant, MD

## 2020-07-27 ENCOUNTER — Encounter: Payer: Self-pay | Admitting: Internal Medicine

## 2020-07-27 ENCOUNTER — Telehealth: Payer: Self-pay | Admitting: Internal Medicine

## 2020-07-27 NOTE — Assessment & Plan Note (Signed)
Continue singulair and xyzal.  Continue nasal spray.  Follow.

## 2020-07-27 NOTE — Assessment & Plan Note (Signed)
Increased stress as outlined.  On lexapro.  Seeing a therapist.  Does not feel needs any further intervention at this time.  Follow.

## 2020-07-27 NOTE — Telephone Encounter (Signed)
Per our chart, she has not had mammogram since 2018.  Needs mammogram.  Order placed for [facility].

## 2020-07-27 NOTE — Assessment & Plan Note (Signed)
Followed by neurology.  Continue dilantin.

## 2020-07-27 NOTE — Assessment & Plan Note (Signed)
Followed by neurology.  Has imitrex if needed.  Stable.

## 2020-07-28 NOTE — Telephone Encounter (Signed)
Patient is aware and will schedule her own mammogram

## 2020-09-18 ENCOUNTER — Other Ambulatory Visit: Payer: Self-pay

## 2020-09-18 ENCOUNTER — Ambulatory Visit (INDEPENDENT_AMBULATORY_CARE_PROVIDER_SITE_OTHER): Payer: Managed Care, Other (non HMO) | Admitting: Internal Medicine

## 2020-09-18 VITALS — BP 118/72 | HR 88 | Temp 96.4°F | Resp 16 | Ht 66.0 in | Wt 165.8 lb

## 2020-09-18 DIAGNOSIS — G43809 Other migraine, not intractable, without status migrainosus: Secondary | ICD-10-CM | POA: Diagnosis not present

## 2020-09-18 DIAGNOSIS — F439 Reaction to severe stress, unspecified: Secondary | ICD-10-CM

## 2020-09-18 DIAGNOSIS — G40909 Epilepsy, unspecified, not intractable, without status epilepticus: Secondary | ICD-10-CM

## 2020-09-18 DIAGNOSIS — Z13220 Encounter for screening for lipoid disorders: Secondary | ICD-10-CM

## 2020-09-18 DIAGNOSIS — E538 Deficiency of other specified B group vitamins: Secondary | ICD-10-CM

## 2020-09-18 DIAGNOSIS — Z0000 Encounter for general adult medical examination without abnormal findings: Secondary | ICD-10-CM

## 2020-09-18 DIAGNOSIS — Z9109 Other allergy status, other than to drugs and biological substances: Secondary | ICD-10-CM

## 2020-09-18 NOTE — Progress Notes (Signed)
Patient ID: Alyssa Bautista, female   DOB: 12/05/1970, 50 y.o.   MRN: [id number]   Subjective:    Patient ID: Alyssa Bautista, female    DOB: 1970-08-08, 50 y.o.   MRN: [id number]  HPI This visit occurred during the SARS-CoV-2 public health emergency.  Safety protocols were in place, including screening questions prior to the visit, additional usage of staff PPE, and extensive cleaning of exam room while observing appropriate contact time as indicated for disinfecting solutions.  Patient here for her physical exam.  Increased stress recently.  Ex husband just passed.  Discussed.  Overall she feels she is handling things relatively well. On lexapro.  Working.  Tries to stay active.  No chest pain or sob reported.  No abdominal pain or bowel change reported.  Headaches stable.  Sees neurology.    Past Medical History:  Diagnosis Date  . Anxiety   . Arthritis   . Depression   . Environmental allergies   . Gestational diabetes   . History of chicken pox   . Migraines   . MVP (mitral valve prolapse)   . Seizures ([place])    Past Surgical History:  Procedure Laterality Date  . ABDOMINAL HYSTERECTOMY  02/2011   abnormal pap (stage III dysplasia).   ovaries not removed.  Marland Kitchen abscess removal  2007   cervix   . DILATION AND CURETTAGE OF UTERUS  2002, 2005  . LEEP  2008  . WISDOM TOOTH EXTRACTION  2003   Family History  Problem Relation Age of Onset  . Hypercholesterolemia Mother   . Hypercholesterolemia Father   . Diabetes Paternal Grandfather   . Prostate cancer Paternal Grandfather   . Breast cancer Maternal Aunt        50's  . Colon cancer Neg Hx    Social History   Socioeconomic History  . Marital status: Divorced    Spouse name: Not on file  . Number of children: 2  . Years of education: Not on file  . Highest education level: Not on file  Occupational History  . Not on file  Tobacco Use  . Smoking status: Never Smoker  . Smokeless tobacco: Never Used   Substance and Sexual Activity  . Alcohol use: No    Alcohol/week: 0.0 standard drinks    Comment: rarely  . Drug use: No  . Sexual activity: Not on file  Other Topics Concern  . Not on file  Social History Narrative  . Not on file   Social Determinants of Health   Financial Resource Strain: Not on file  Food Insecurity: Not on file  Transportation Needs: Not on file  Physical Activity: Not on file  Stress: Not on file  Social Connections: Not on file    Outpatient Encounter Medications as of 09/18/2020  Medication Sig  . acetaminophen (TYLENOL) 500 MG tablet Take 500 mg by mouth as needed.  Marland Kitchen aspirin 325 MG EC tablet Take 325 mg by mouth every 6 (six) hours as needed for pain.  . cyanocobalamin (,VITAMIN B-12,) 1000 MCG/ML injection Inject 1 mL (1,000 mcg total) into the muscle every 30 (thirty) days.  Marland Kitchen escitalopram (LEXAPRO) 10 MG tablet Take 1.5 tablets (15 mg total) by mouth daily.  Marland Kitchen levocetirizine (XYZAL) 5 MG tablet TAKE 1 TABLET(5 MG) BY MOUTH EVERY EVENING  . LORazepam (ATIVAN) 0.5 MG tablet 1/2 tablet q day prn  . montelukast (SINGULAIR) 10 MG tablet TAKE 1 TABLET(10 MG) BY MOUTH AT BEDTIME  . phenytoin (DILANTIN)  100 MG ER capsule Take 1 capsule (100 mg total) by mouth 3 (three) times daily.  . promethazine (PHENERGAN) 25 MG tablet Take by mouth.  . SUMAtriptan (IMITREX) 50 MG tablet Take by mouth.  . SYRINGE-NEEDLE, DISP, 3 ML (B-D 3CC LUER-LOK SYR 25GX1") 25G X 1" 3 ML MISC USE AS DIRECTED   No facility-administered encounter medications on file as of 09/18/2020.    Review of Systems  Constitutional: Negative for appetite change and unexpected weight change.  HENT: Negative for congestion, sinus pressure and sore throat.   Eyes: Negative for pain and visual disturbance.  Respiratory: Negative for cough, chest tightness and shortness of breath.   Cardiovascular: Negative for chest pain, palpitations and leg swelling.  Gastrointestinal: Negative for abdominal  pain, diarrhea, nausea and vomiting.  Genitourinary: Negative for difficulty urinating and dysuria.  Musculoskeletal: Negative for joint swelling and myalgias.  Skin: Negative for color change and rash.  Neurological: Negative for dizziness.       Headaches stable.  Hematological: Negative for adenopathy. Does not bruise/bleed easily.  Psychiatric/Behavioral: Negative for agitation and dysphoric mood.       Objective:    Physical Exam Vitals reviewed.  Constitutional:      General: She is not in acute distress.    Appearance: Normal appearance. She is well-developed.  HENT:     Head: Normocephalic and atraumatic.     Right Ear: External ear normal.     Left Ear: External ear normal.  Eyes:     General: No scleral icterus.       Right eye: No discharge.        Left eye: No discharge.     Conjunctiva/sclera: Conjunctivae normal.  Neck:     Thyroid: No thyromegaly.  Cardiovascular:     Rate and Rhythm: Normal rate and regular rhythm.  Pulmonary:     Effort: No tachypnea, accessory muscle usage or respiratory distress.     Breath sounds: Normal breath sounds. No decreased breath sounds or wheezing.  Chest:  Breasts:     Right: No inverted nipple, mass, nipple discharge or tenderness (no axillary adenopathy).     Left: No inverted nipple, mass, nipple discharge or tenderness (no axilarry adenopathy).    Abdominal:     General: Bowel sounds are normal.     Palpations: Abdomen is soft.     Tenderness: There is no abdominal tenderness.  Musculoskeletal:        General: No swelling or tenderness.     Cervical back: Neck supple. No tenderness.  Lymphadenopathy:     Cervical: No cervical adenopathy.  Skin:    Findings: No erythema or rash.  Neurological:     Mental Status: She is alert and oriented to person, place, and time.  Psychiatric:        Mood and Affect: Mood normal.        Behavior: Behavior normal.     BP 118/72   Pulse 88   Temp (!) 96.4 F (35.8 C)  (Temporal)   Resp 16   Ht 5\' 6"  (1.676 m)   Wt 165 lb 12.8 oz (75.2 kg)   LMP 02/24/2011   SpO2 98%   BMI 26.76 kg/m  Wt Readings from Last 3 Encounters:  09/18/20 165 lb 12.8 oz (75.2 kg)  07/15/20 164 lb (74.4 kg)  08/13/19 160 lb (72.6 kg)     Lab Results  Component Value Date   WBC 4.8 10/20/2017   HGB 13.3 10/20/2017  HCT 39.2 10/20/2017   PLT 308.0 10/20/2017   GLUCOSE 85 08/13/2019   CHOL 204 (H) 08/13/2019   TRIG 116.0 08/13/2019   HDL 62.40 08/13/2019   LDLCALC 119 (H) 08/13/2019   ALT 21 08/13/2019   AST 15 08/13/2019   NA 139 08/13/2019   K 4.1 08/13/2019   CL 102 08/13/2019   CREATININE 0.78 08/13/2019   BUN 8 08/13/2019   CO2 30 08/13/2019   TSH 1.87 08/13/2019       Assessment & Plan:   Problem List Items Addressed This Visit    B12 deficiency    Has been on B12 injections.  Check B12 level with next labs.        Relevant Orders   Vitamin B12   Environmental allergies    Continue xyzal and singulair.       Health care maintenance    Physical today 09/18/20.  PAP 08/13/19 - negative with negative HPV. S/p hysterectomy.  Mammogram ordered.  Discussed with her regarding scheduling.  Discussed need for colonoscopy.  She will notify me when agreeable.       Migraine headache    Has imitrex if needed.  Followed by neurology.       Seizure disorder (HCC)    Continue dilantin.  Stable.        Relevant Orders   CBC with Differential/Platelet   Comprehensive metabolic panel   TSH   Stress    Increased stress as outlined.  Discussed.  Has seen a therapist.  Overall she feels she is handling things relatively well.  Follow.  Continue lexapro.         Other Visit Diagnoses    Screening cholesterol level    -  Primary   Relevant Orders   Lipid panel       Einar Pheasant, MD

## 2020-09-20 ENCOUNTER — Encounter: Payer: Self-pay | Admitting: Internal Medicine

## 2020-09-20 NOTE — Assessment & Plan Note (Signed)
Increased stress as outlined.  Discussed.  Has seen a therapist.  Overall she feels she is handling things relatively well.  Follow.  Continue lexapro.

## 2020-09-20 NOTE — Assessment & Plan Note (Signed)
Continue xyzal and singulair.

## 2020-09-20 NOTE — Assessment & Plan Note (Signed)
Physical today 09/18/20.  PAP 08/13/19 - negative with negative HPV. S/p hysterectomy.  Mammogram ordered.  Discussed with her regarding scheduling.  Discussed need for colonoscopy.  She will notify me when agreeable.

## 2020-09-20 NOTE — Assessment & Plan Note (Signed)
Continue dilantin.  Stable.   

## 2020-09-20 NOTE — Assessment & Plan Note (Signed)
Has been on B12 injections.  Check B12 level with next labs.   

## 2020-09-20 NOTE — Assessment & Plan Note (Signed)
Has imitrex if needed.  Followed by neurology.

## 2020-10-02 ENCOUNTER — Other Ambulatory Visit (INDEPENDENT_AMBULATORY_CARE_PROVIDER_SITE_OTHER): Payer: Managed Care, Other (non HMO)

## 2020-10-02 ENCOUNTER — Other Ambulatory Visit: Payer: Self-pay

## 2020-10-02 DIAGNOSIS — Z13220 Encounter for screening for lipoid disorders: Secondary | ICD-10-CM | POA: Diagnosis not present

## 2020-10-02 DIAGNOSIS — G40909 Epilepsy, unspecified, not intractable, without status epilepticus: Secondary | ICD-10-CM | POA: Diagnosis not present

## 2020-10-02 DIAGNOSIS — E538 Deficiency of other specified B group vitamins: Secondary | ICD-10-CM | POA: Diagnosis not present

## 2020-10-02 LAB — LIPID PANEL
Cholesterol: 207 mg/dL — ABNORMAL HIGH (ref 0–200)
HDL: 65.5 mg/dL (ref >39.00)
LDL Cholesterol: 123 mg/dL — ABNORMAL HIGH (ref 0–99)
NonHDL: 141.7
Total CHOL/HDL Ratio: 3
Triglycerides: 94 mg/dL (ref 0.0–149.0)
VLDL: 18.8 mg/dL (ref 0.0–40.0)

## 2020-10-02 LAB — CBC WITH DIFFERENTIAL/PLATELET
Basophils Absolute: 0 10*3/uL (ref 0.0–0.1)
Basophils Relative: 0.3 % (ref 0.0–3.0)
Eosinophils Absolute: 0.1 10*3/uL (ref 0.0–0.7)
Eosinophils Relative: 1.5 % (ref 0.0–5.0)
HCT: 37.7 % (ref 36.0–46.0)
Hemoglobin: 12.8 g/dL (ref 12.0–15.0)
Lymphocytes Relative: 31.1 % (ref 12.0–46.0)
Lymphs Abs: 1.9 10*3/uL (ref 0.7–4.0)
MCHC: 34 g/dL (ref 30.0–36.0)
MCV: 98.2 fl (ref 78.0–100.0)
Monocytes Absolute: 0.4 10*3/uL (ref 0.1–1.0)
Monocytes Relative: 6 % (ref 3.0–12.0)
Neutro Abs: 3.6 10*3/uL (ref 1.4–7.7)
Neutrophils Relative %: 61.1 % (ref 43.0–77.0)
Platelets: 296 10*3/uL (ref 150.0–400.0)
RBC: 3.84 Mil/uL — ABNORMAL LOW (ref 3.87–5.11)
RDW: 13.9 % (ref 11.5–15.5)
WBC: 6 10*3/uL (ref 4.0–10.5)

## 2020-10-02 LAB — COMPREHENSIVE METABOLIC PANEL
ALT: 17 U/L (ref 0–35)
AST: 14 U/L (ref 0–37)
Albumin: 4.2 g/dL (ref 3.5–5.2)
Alkaline Phosphatase: 101 U/L (ref 39–117)
BUN: 8 mg/dL (ref 6–23)
CO2: 29 mEq/L (ref 19–32)
Calcium: 8.8 mg/dL (ref 8.4–10.5)
Chloride: 105 mEq/L (ref 96–112)
Creatinine, Ser: 0.72 mg/dL (ref 0.40–1.20)
GFR: 99.4 mL/min (ref >60.00)
Glucose, Bld: 86 mg/dL (ref 70–99)
Potassium: 4.7 mEq/L (ref 3.5–5.1)
Sodium: 139 mEq/L (ref 135–145)
Total Bilirubin: 0.4 mg/dL (ref 0.2–1.2)
Total Protein: 6.9 g/dL (ref 6.0–8.3)

## 2020-10-02 LAB — TSH: TSH: 2.21 u[IU]/mL (ref 0.35–4.50)

## 2020-10-02 LAB — VITAMIN B12: Vitamin B-12: 125 pg/mL — ABNORMAL LOW (ref 211–911)

## 2020-10-03 ENCOUNTER — Telehealth: Payer: Self-pay

## 2020-10-03 NOTE — Telephone Encounter (Signed)
LMTCB in regards to lab results.  

## 2020-10-10 ENCOUNTER — Other Ambulatory Visit: Payer: Self-pay

## 2020-10-10 MED ORDER — CYANOCOBALAMIN 1000 MCG/ML IJ SOLN: INTRAMUSCULAR | 0 refills | Status: DC

## 2020-10-10 MED ORDER — "BD LUER-LOK SYRINGE 25G X 1"" 3 ML MISC": 0 refills | Status: DC

## 2020-11-03 ENCOUNTER — Other Ambulatory Visit: Payer: Self-pay | Admitting: Internal Medicine

## 2020-11-03 DIAGNOSIS — Z1231 Encounter for screening mammogram for malignant neoplasm of breast: Secondary | ICD-10-CM

## 2020-11-05 ENCOUNTER — Encounter: Payer: Self-pay | Admitting: Internal Medicine

## 2020-12-12 ENCOUNTER — Other Ambulatory Visit: Payer: Self-pay | Admitting: Internal Medicine

## 2020-12-17 ENCOUNTER — Encounter: Payer: Self-pay | Admitting: Internal Medicine

## 2020-12-19 ENCOUNTER — Ambulatory Visit: Payer: 59 | Admitting: Internal Medicine

## 2020-12-26 ENCOUNTER — Telehealth: Payer: Self-pay | Admitting: Cardiovascular Disease

## 2020-12-26 DIAGNOSIS — R079 Chest pain, unspecified: Secondary | ICD-10-CM | POA: Insufficient documentation

## 2020-12-26 DIAGNOSIS — I341 Nonrheumatic mitral (valve) prolapse: Secondary | ICD-10-CM

## 2020-12-26 NOTE — Telephone Encounter (Signed)
Spoke with patient and scheduled her to come in on 8/24 at 3 pm for echocardiogram then 8/25 at 10 am with Dr. Fletcher Anon. Patient verbalized understanding and was agreeable with these date and times. Updated Dr. Fletcher Anon via secure chat these appointments were made.

## 2020-12-26 NOTE — Telephone Encounter (Signed)
Received secure chat from Dr. Fletcher Anon requesting the following message:  This is one of my patients . She is having some chest pain and dyspnea . Please arrange for an echo ASAP and follow up next week with me. She has known history of mitral valve prolapse.

## 2020-12-31 ENCOUNTER — Other Ambulatory Visit: Payer: Self-pay

## 2020-12-31 ENCOUNTER — Ambulatory Visit (INDEPENDENT_AMBULATORY_CARE_PROVIDER_SITE_OTHER): Payer: Managed Care, Other (non HMO)

## 2020-12-31 DIAGNOSIS — I341 Nonrheumatic mitral (valve) prolapse: Secondary | ICD-10-CM | POA: Diagnosis not present

## 2021-01-01 ENCOUNTER — Ambulatory Visit: Payer: Managed Care, Other (non HMO) | Admitting: Internal Medicine

## 2021-01-01 ENCOUNTER — Ambulatory Visit: Payer: Managed Care, Other (non HMO) | Admitting: Cardiovascular Disease

## 2021-01-01 ENCOUNTER — Encounter: Payer: Self-pay | Admitting: Cardiovascular Disease

## 2021-01-01 VITALS — BP 134/78 | HR 88 | Ht 66.0 in | Wt 161.5 lb

## 2021-01-01 DIAGNOSIS — R072 Precordial pain: Secondary | ICD-10-CM

## 2021-01-01 DIAGNOSIS — I341 Nonrheumatic mitral (valve) prolapse: Secondary | ICD-10-CM | POA: Diagnosis not present

## 2021-01-01 LAB — ECHOCARDIOGRAM COMPLETE
AR max vel: 3.19 cm2
AV Area VTI: 2.97 cm2
AV Area mean vel: 2.76 cm2
AV Mean grad: 3 mmHg
AV Peak grad: 4.4 mmHg
Ao pk vel: 1.05 m/s
Area-P 1/2: 5.66 cm2
S' Lateral: 3 cm

## 2021-01-01 MED ORDER — METOPROLOL TARTRATE 100 MG PO TABS: ORAL_TABLET | ORAL | 0 refills | Status: DC

## 2021-01-01 NOTE — Progress Notes (Signed)
Cardiology Office Note   Date:  01/01/2021   ID:  Alyssa Bautista, DOB 1970/12/28, MRN [id number]  PCP:  Einar Pheasant, MD  Cardiologist:   Kathlyn Sacramento, MD   Chief Complaint  Patient presents with   Other    F/u echo c/o stress. Meds reviewed verbally with pt.      History of Present Illness: Alyssa Bautista is a 50 y.o. female who presents for an urgent evaluation regarding chest pain.  She has known history of mitral valve prolapse with moderate mitral regurgitation.    She has chronic medical conditions that include migraine headaches, seizures, depression and abnormal pap smear requiring hysterectomy.   She had previous vasovagal syncope.  She had recent episodes of substernal chest pain and discomfort which usually last few minutes to 10 minutes at a time.  This is mostly triggered by stress at work and usually does not happen with exertion.  She has noted significant worsening of exertional dyspnea with no orthopnea or PND.  We repeated her echocardiogram yesterday which was personally reviewed by me and showed an EF of 55 to 60% with myxomatous mitral valve with prolapse and mild to moderate mitral regurgitation.  The left atrium was mildly dilated.  These findings were stable compared to most recent echocardiogram.   Past Medical History:  Diagnosis Date   Anxiety    Arthritis    Depression    Environmental allergies    Gestational diabetes    History of chicken pox    Migraines    MVP (mitral valve prolapse)    Seizures ([place])     Past Surgical History:  Procedure Laterality Date   ABDOMINAL HYSTERECTOMY  02/2011   abnormal pap (stage III dysplasia).   ovaries not removed.   abscess removal  2007   cervix    DILATION AND CURETTAGE OF UTERUS  2002, 2005   LEEP  2008   WISDOM TOOTH EXTRACTION  2003     Current Outpatient Medications  Medication Sig Dispense Refill   acetaminophen (TYLENOL) 500 MG tablet Take 500 mg by mouth as needed.      aspirin 325 MG EC tablet Take 325 mg by mouth every 6 (six) hours as needed for pain.     cyanocobalamin (,VITAMIN B-12,) 1000 MCG/ML injection Inject 1 mL into the muscle once weekly for 4 weeks and then once every 30 days. 10 mL 0   escitalopram (LEXAPRO) 10 MG tablet TAKE 1 AND 1/2 TABLETS(15 MG) BY MOUTH DAILY 45 tablet 2   levocetirizine (XYZAL) 5 MG tablet TAKE 1 TABLET(5 MG) BY MOUTH EVERY EVENING 30 tablet 4   LORazepam (ATIVAN) 0.5 MG tablet 1/2 tablet q day prn 30 tablet 0   metoprolol tartrate (LOPRESSOR) 100 MG tablet Take 1 tablet (100 mg) 2 hours prior to your Cardiac CTA 1 tablet 0   montelukast (SINGULAIR) 10 MG tablet TAKE 1 TABLET(10 MG) BY MOUTH AT BEDTIME 30 tablet 4   phenytoin (DILANTIN) 100 MG ER capsule Take 1 capsule (100 mg total) by mouth 3 (three) times daily. 90 capsule 0   promethazine (PHENERGAN) 25 MG tablet Take by mouth.     SUMAtriptan (IMITREX) 50 MG tablet Take by mouth.     SYRINGE-NEEDLE, DISP, 3 ML (B-D 3CC LUER-LOK SYR 25GX1") 25G X 1" 3 ML MISC USE AS DIRECTED 20 each 0   topiramate (TOPAMAX) 50 MG tablet Take 0.5 mg by mouth in the morning and at bedtime.  No current facility-administered medications for this visit.    Allergies:   Penicillins, Azithromycin, Dilantin [phenytoin], and Clindamycin/lincomycin    Social History:  The patient  reports that she has never smoked. She has never used smokeless tobacco. She reports that she does not drink alcohol and does not use drugs.   Family History:  The patient's family history includes Breast cancer in her maternal aunt; Diabetes in her paternal grandfather; Hypercholesterolemia in her father and mother; Prostate cancer in her paternal grandfather.    ROS:  Please see the history of present illness.   Otherwise, review of systems are positive for none.   All other systems are reviewed and negative.    PHYSICAL EXAM: VS:  BP 134/78 (BP Location: Left Arm, Patient Position: Sitting, Cuff Size:  Normal)   Pulse 88   Ht '5\' 6"'$  (1.676 m)   Wt 161 lb 8 oz (73.3 kg)   LMP 02/24/2011   SpO2 99%   BMI 26.07 kg/m  , BMI Body mass index is 26.07 kg/m. GEN: Well nourished, well developed, in no acute distress  HEENT: normal  Neck: no JVD, carotid bruits, or masses Cardiac: RRR; no rubs, or gallops,no edema .  2 out of 6 holosystolic murmur at the apex Respiratory:  clear to auscultation bilaterally, normal work of breathing GI: soft, nontender, nondistended, + BS MS: no deformity or atrophy  Skin: warm and dry, no rash Neuro:  Strength and sensation are intact Psych: euthymic mood, full affect   EKG:  EKG is ordered today. The ekg ordered today demonstrates normal sinus rhythm with possible old inferior and lateral T wave changes suggestive of ischemia.   Recent Labs: 10/02/2020: ALT 17; BUN 8; Creatinine, Ser 0.72; Hemoglobin 12.8; Platelets 296.0; Potassium 4.7; Sodium 139; TSH 2.21    Lipid Panel    Component Value Date/Time   CHOL 207 (H) 10/02/2020 1012   TRIG 94.0 10/02/2020 1012   HDL 65.50 10/02/2020 1012   CHOLHDL 3 10/02/2020 1012   VLDL 18.8 10/02/2020 1012   LDLCALC 123 (H) 10/02/2020 1012      Wt Readings from Last 3 Encounters:  01/01/21 161 lb 8 oz (73.3 kg)  09/18/20 165 lb 12.8 oz (75.2 kg)  07/15/20 164 lb (74.4 kg)       No flowsheet data found.    ASSESSMENT AND PLAN:  1.  Chest pain: Her chest pain quality is overall atypical and seems to be mostly triggered by stress.  However, I am concerned about her significant exertional dyspnea which seems to have progressed significantly over the last few months.  Her EKG is abnormal with lateral T wave changes suggestive of ischemia.  Due to that, I recommend evaluation with cardiac CTA.  2.  Mitral valve prolapse with  mitral regurgitation: I discussed the results of recent echocardiogram with her which showed stable mild to moderate mitral regurgitation.  Mitral regurgitation at this degree is not  the culprit for her symptoms.  3.  Previous presyncope: Thought to be vasovagal.  No recurrent episodes.   Disposition:   FU with me in 1 year or earlier if cardiac CTA is abnormal.  Signed,  Kathlyn Sacramento, MD  01/01/2021 10:27 AM    [place]

## 2021-01-01 NOTE — Patient Instructions (Signed)
Medication Instructions:  Your physician recommends that you continue on your current medications as directed. Please refer to the Current Medication list given to you today.   A one time dose of Metoprolol 100 mg tablet to be taken 2 hours prior to your Cardiac CTA  has been sent to your pharmacy.  *If you need a refill on your cardiac medications before your next appointment, please call your pharmacy*   Lab Work: None ordered  If you have labs (blood work) drawn today and your tests are completely normal, you will receive your results only by: [place] (if you have MyChart) OR A paper copy in the mail If you have any lab test that is abnormal or we need to change your treatment, we will call you to review the results.   Testing/Procedures: Your physician has requested that you have cardiac CT. Cardiac computed tomography (CT) is a painless test that uses an x-ray machine to take clear, detailed pictures of your heart. For further information please visit [email or web address]. Please follow instruction sheet as given.     Follow-Up: At [facility], you and your health needs are our priority.  As part of our continuing mission to provide you with exceptional heart care, we have created designated Provider Care Teams.  These Care Teams include your primary Cardiologist (physician) and Advanced Practice Providers (APPs -  Physician Assistants and Nurse Practitioners) who all work together to provide you with the care you need, when you need it.  We recommend signing up for the patient portal called "MyChart".  Sign up information is provided on this After Visit Summary.  MyChart is used to connect with patients for Virtual Visits (Telemedicine).  Patients are able to view lab/test results, encounter notes, upcoming appointments, etc.  Non-urgent messages can be sent to your provider as well.   To learn more about what you can do with MyChart, go to [email or web address].     Your physician wants you to follow-up in: 1 year You will receive a reminder letter in the mail two months in advance. If you don't receive a letter, please call our office to schedule the follow-up appointment.   The format for your next appointment:   In Person  Provider:   You may see Kathlyn Sacramento, MD or one of the following Advanced Practice Providers on your designated Care Team:   Murray Hodgkins, NP Christell Faith, PA-C Marrianne Mood, PA-C Cadence Kathlen Mody, [place]   Other Instructions    Your cardiac CT will be scheduled at one of the below locations:   [facility] [address] [place], [place] [address] [phone]  [place] [address] [place], [place] [address] [phone]  If scheduled at [facility], please arrive at the [facility] main entrance (entrance A) of [facility] 30 minutes prior to test start time. Proceed to the [facility] Radiology Department (first floor) to check-in and test prep.  If scheduled at [facility], please arrive 15 mins early for check-in and test prep.  Please follow these instructions carefully (unless otherwise directed):   On the Night Before the Test: Be sure to Drink plenty of water. Do not consume any caffeinated/decaffeinated beverages or chocolate 12 hours prior to your test. Do not take any antihistamines 12 hours prior to your test.  On the Day of the Test: Drink plenty of water until 1 hour prior to the test. Do not eat  any food 4 hours prior to the test. You may take your regular medications prior to the test.  Take metoprolol (Lopressor) two hours prior to test. FEMALES- please wear underwire-free bra if available, avoid dresses & tight clothing         After the Test: Drink plenty of water. After receiving IV contrast, you may experience a mild flushed feeling. This is normal. On occasion,  you may experience a mild rash up to 24 hours after the test. This is not dangerous. If this occurs, you can take Benadryl 25 mg and increase your fluid intake. If you experience trouble breathing, this can be serious. If it is severe call 911 IMMEDIATELY. If it is mild, please call our office. If you take any of these medications: Glipizide/Metformin, Avandament, Glucavance, please do not take 48 hours after completing test unless otherwise instructed.  Please allow 2-4 weeks for scheduling of routine cardiac CTs. Some insurance companies require a pre-authorization which may delay scheduling of this test.   For non-scheduling related questions, please contact the cardiac imaging nurse navigator should you have any questions/concerns: Marchia Bond, Cardiac Imaging Nurse Navigator Gordy Clement, Cardiac Imaging Nurse Navigator [facility] Direct Office Dial: [phone]   For scheduling needs, including cancellations and rescheduling, please call [place], [phone].

## 2021-01-09 ENCOUNTER — Telehealth (INDEPENDENT_AMBULATORY_CARE_PROVIDER_SITE_OTHER): Payer: Managed Care, Other (non HMO) | Admitting: Family Medicine

## 2021-01-09 ENCOUNTER — Encounter: Payer: Self-pay | Admitting: Family Medicine

## 2021-01-09 VITALS — Ht 66.0 in

## 2021-01-09 DIAGNOSIS — U071 COVID-19: Secondary | ICD-10-CM | POA: Diagnosis not present

## 2021-01-09 MED ORDER — MOLNUPIRAVIR EUA 200MG CAPSULE: 4.0000 | ORAL_CAPSULE | Freq: Two times a day (BID) | ORAL | 0 refills | Status: AC

## 2021-01-09 NOTE — Progress Notes (Signed)
Patient ID: Alyssa Bautista, female   DOB: 08/26/1970, 50 y.o.   MRN: [id number]  This visit type was conducted due to national recommendations for restrictions regarding the COVID-19 pandemic in an effort to limit this patient's exposure and mitigate transmission in our community.   Virtual Visit via Video Note  I connected with Alyssa Bautista on 01/09/21 at  3:30 PM EDT by a video enabled telemedicine application and verified that I am speaking with the correct person using two identifiers.  Location patient: home Location provider:work or home office Persons participating in the virtual visit: patient, provider  I discussed the limitations of evaluation and management by telemedicine and the availability of in person appointments. The patient expressed understanding and agreed to proceed.   HPI:  Alyssa Bautista woke up yesterday with a sore throat.  Her husband actually tested positive for COVID Tuesday this week.  She subsequently developed some nasal congestion, headaches, fatigue, body aches.  She tested positive earlier today for COVID with home test.  No cough.  No dyspnea.  No nausea or vomiting.  Slight decrease in taste.  She does have history of mitral valve prolapse and seizure disorder.  She is on several medications including Dilantin, Topamax, Lexapro.  Other than her mitral valve prolapse denies any chronic heart problems.  No chronic lung issues.  Non-smoker.  ROS: See pertinent positives and negatives per HPI.  Past Medical History:  Diagnosis Date   Anxiety    Arthritis    Depression    Environmental allergies    Gestational diabetes    History of chicken pox    Migraines    MVP (mitral valve prolapse)    Seizures ([place])     Past Surgical History:  Procedure Laterality Date   ABDOMINAL HYSTERECTOMY  02/2011   abnormal pap (stage III dysplasia).   ovaries not removed.   abscess removal  2007   cervix    DILATION AND CURETTAGE OF UTERUS  2002, 2005   LEEP   2008   WISDOM TOOTH EXTRACTION  2003    Family History  Problem Relation Age of Onset   Hypercholesterolemia Mother    Hypercholesterolemia Father    Diabetes Paternal Grandfather    Prostate cancer Paternal Grandfather    Breast cancer Maternal Aunt        50's   Colon cancer Neg Hx     SOCIAL HX: Non-smoker   Current Outpatient Medications:    acetaminophen (TYLENOL) 500 MG tablet, Take 500 mg by mouth as needed., Disp: , Rfl:    aspirin 325 MG EC tablet, Take 325 mg by mouth every 6 (six) hours as needed for pain., Disp: , Rfl:    cyanocobalamin (,VITAMIN B-12,) 1000 MCG/ML injection, Inject 1 mL into the muscle once weekly for 4 weeks and then once every 30 days., Disp: 10 mL, Rfl: 0   escitalopram (LEXAPRO) 10 MG tablet, TAKE 1 AND 1/2 TABLETS(15 MG) BY MOUTH DAILY, Disp: 45 tablet, Rfl: 2   levocetirizine (XYZAL) 5 MG tablet, TAKE 1 TABLET(5 MG) BY MOUTH EVERY EVENING, Disp: 30 tablet, Rfl: 4   LORazepam (ATIVAN) 0.5 MG tablet, 1/2 tablet q day prn, Disp: 30 tablet, Rfl: 0   metoprolol tartrate (LOPRESSOR) 100 MG tablet, Take 1 tablet (100 mg) 2 hours prior to your Cardiac CTA, Disp: 1 tablet, Rfl: 0   molnupiravir EUA 200 mg CAPS, Take 4 capsules (800 mg total) by mouth 2 (two) times daily for 5 days., Disp: 40 capsule, Rfl:  0   montelukast (SINGULAIR) 10 MG tablet, TAKE 1 TABLET(10 MG) BY MOUTH AT BEDTIME, Disp: 30 tablet, Rfl: 4   phenytoin (DILANTIN) 100 MG ER capsule, Take 1 capsule (100 mg total) by mouth 3 (three) times daily., Disp: 90 capsule, Rfl: 0   promethazine (PHENERGAN) 25 MG tablet, Take by mouth., Disp: , Rfl:    SUMAtriptan (IMITREX) 50 MG tablet, Take by mouth., Disp: , Rfl:    SYRINGE-NEEDLE, DISP, 3 ML (B-D 3CC LUER-LOK SYR 25GX1") 25G X 1" 3 ML MISC, USE AS DIRECTED, Disp: 20 each, Rfl: 0   topiramate (TOPAMAX) 50 MG tablet, Take 0.5 mg by mouth in the morning and at bedtime., Disp: , Rfl:   EXAM:  VITALS per patient if applicable:  GENERAL: alert,  oriented, appears well and in no acute distress  HEENT: atraumatic, conjunttiva clear, no obvious abnormalities on inspection of external nose and ears  NECK: normal movements of the head and neck  LUNGS: on inspection no signs of respiratory distress, breathing rate appears normal, no obvious gross SOB, gasping or wheezing  CV: no obvious cyanosis  MS: moves all visible extremities without noticeable abnormality  PSYCH/NEURO: pleasant and cooperative, no obvious depression or anxiety, speech and thought processing grossly intact  ASSESSMENT AND PLAN:  Discussed the following assessment and plan:  COVID-19 infection.  Patient is currently in no distress.  She does have history of mitral valve prolapse and seizure disorder but overall relatively low risk.  -Plenty of fluids and rest -Patient aware of isolation recommendations -We discussed antiviral medications at some length.  Would not recommend Paxlovid with her Dilantin and other medications.  Did discuss possible treatment with Molnupiravir.  She is still undecided at this time but would like to consider. -She knows to go to the ER or follow-up immediately for increased shortness of breath     I discussed the assessment and treatment plan with the patient. The patient was provided an opportunity to ask questions and all were answered. The patient agreed with the plan and demonstrated an understanding of the instructions.   The patient was advised to call back or seek an in-person evaluation if the symptoms worsen or if the condition fails to improve as anticipated.     Carolann Littler, MD

## 2021-01-15 ENCOUNTER — Encounter: Payer: Self-pay | Admitting: Family Medicine

## 2021-01-15 ENCOUNTER — Other Ambulatory Visit: Payer: Self-pay

## 2021-01-15 ENCOUNTER — Telehealth (INDEPENDENT_AMBULATORY_CARE_PROVIDER_SITE_OTHER): Payer: Managed Care, Other (non HMO) | Admitting: Family Medicine

## 2021-01-15 DIAGNOSIS — U071 COVID-19: Secondary | ICD-10-CM

## 2021-01-15 MED ORDER — BENZONATATE 200 MG PO CAPS: 200.0000 mg | ORAL_CAPSULE | Freq: Three times a day (TID) | ORAL | 1 refills | Status: DC | PRN

## 2021-01-15 MED ORDER — PREDNISONE 10 MG PO TABS: ORAL_TABLET | ORAL | 0 refills | Status: DC

## 2021-01-15 NOTE — Assessment & Plan Note (Signed)
covid in day 7- now w/o fever but continued congestion and cough  Cough sounds barky/tight  Px prednisone taper 40 mg  Also tessalon for cough Need to watch out for worse cough or wheezing or return of fever Update if not starting to improve in a week or if worsening  ER parameters discussed

## 2021-01-15 NOTE — Patient Instructions (Signed)
Drink fluids and rest  Take the prednisone taper to help with congestion and tight chest/cough  Try tessalon for cough  Nasal saline for congestion  Update if not starting to improve in a week or if worsening   If symptoms become severe or you are short of breath, go to the ER

## 2021-01-15 NOTE — Progress Notes (Signed)
Virtual Visit via Video Note  I connected with Alyssa Bautista on 01/15/21 at 10:30 AM EDT by a video enabled telemedicine application and verified that I am speaking with the correct person using two identifiers.  Location: Patient: home Provider: office   I discussed the limitations of evaluation and management by telemedicine and the availability of in person appointments. The patient expressed understanding and agreed to proceed.  Parties involved in encounter  Patient: Alyssa Bautista  Provider:  Loura Pardon MD   History of Present Illness: Pt presents with c/o covid 8 50 yo pt of Dr Nicki Reaper  She has h/o fa seizure disorder and takes dilantin   She had a visit with Dr Elease Hashimoto on 9/2 Noted husband had covid first  She had uri symptoms  Recommended isolation ad fluids and rest with symptomatic care  Did not go for anti virals   Finally broke fever early yesterday  Highest was 102.1  Body aches are improved  Congestion  Not runny nose  Hoarse voice Cough sounds wet but not really productive (? Cough sounds tight also) No wheezing  No sob , but occ has to get a deep breath   Sinus pain early in the week, improved now  Upper teeth-more sensitive  Nasal d/c -not a lot  Cannot smell or taste  Ears feel ok  Throat- was sore and now improved   No GI symptoms   Did not sleep well [place] when flat   Otc : tylenol  Delsym -last night  Saline ns    Is covid immunized  Patient Active Problem List   Diagnosis Date Noted   COVID-19 01/15/2021   B12 deficiency 10/20/2017   Neck nodule 03/06/2017   Environmental allergies 03/06/2017   Lumbar radicular pain 09/27/2014   Health care maintenance 06/09/2014   Vitamin D deficiency 06/07/2014   Murmur 04/28/2014   Stress 03/11/2014   Right leg pain 10/01/2013   Sciatica 06/04/2013   Viral syndrome 06/04/2013   Seizure disorder ([place]) 05/28/2012   Depression 05/28/2012   Migraine headache 05/28/2012   MVP  (mitral valve prolapse) 05/28/2012   Past Medical History:  Diagnosis Date   Anxiety    Arthritis    Depression    Environmental allergies    Gestational diabetes    History of chicken pox    Migraines    MVP (mitral valve prolapse)    Seizures ([place])    Past Surgical History:  Procedure Laterality Date   ABDOMINAL HYSTERECTOMY  02/2011   abnormal pap (stage III dysplasia).   ovaries not removed.   abscess removal  2007   cervix    DILATION AND CURETTAGE OF UTERUS  2002, 2005   LEEP  2008   WISDOM TOOTH EXTRACTION  2003   Social History   Tobacco Use   Smoking status: Never   Smokeless tobacco: Never  Substance Use Topics   Alcohol use: No    Alcohol/week: 0.0 standard drinks    Comment: rarely   Drug use: No   Family History  Problem Relation Age of Onset   Hypercholesterolemia Mother    Hypercholesterolemia Father    Diabetes Paternal Grandfather    Prostate cancer Paternal Grandfather    Breast cancer Maternal Aunt        50's   Colon cancer Neg Hx    Allergies  Allergen Reactions   Penicillins Rash   Azithromycin Other (See Comments)    GI upset   Dilantin [Phenytoin] Other (See  Comments)    By IV, Vasovagil reaction   Clindamycin/Lincomycin Rash   Current Outpatient Medications on File Prior to Visit  Medication Sig Dispense Refill   acetaminophen (TYLENOL) 500 MG tablet Take 500 mg by mouth as needed.     aspirin 325 MG EC tablet Take 325 mg by mouth every 6 (six) hours as needed for pain.     cyanocobalamin (,VITAMIN B-12,) 1000 MCG/ML injection Inject 1 mL into the muscle once weekly for 4 weeks and then once every 30 days. 10 mL 0   escitalopram (LEXAPRO) 10 MG tablet TAKE 1 AND 1/2 TABLETS(15 MG) BY MOUTH DAILY 45 tablet 2   levocetirizine (XYZAL) 5 MG tablet TAKE 1 TABLET(5 MG) BY MOUTH EVERY EVENING 30 tablet 4   LORazepam (ATIVAN) 0.5 MG tablet 1/2 tablet q day prn 30 tablet 0   metoprolol tartrate (LOPRESSOR) 100 MG tablet Take 1 tablet (100  mg) 2 hours prior to your Cardiac CTA 1 tablet 0   montelukast (SINGULAIR) 10 MG tablet TAKE 1 TABLET(10 MG) BY MOUTH AT BEDTIME 30 tablet 4   phenytoin (DILANTIN) 100 MG ER capsule Take 1 capsule (100 mg total) by mouth 3 (three) times daily. 90 capsule 0   promethazine (PHENERGAN) 25 MG tablet Take by mouth.     SUMAtriptan (IMITREX) 50 MG tablet Take by mouth.     SYRINGE-NEEDLE, DISP, 3 ML (B-D 3CC LUER-LOK SYR 25GX1") 25G X 1" 3 ML MISC USE AS DIRECTED 20 each 0   topiramate (TOPAMAX) 50 MG tablet Take 0.5 mg by mouth in the morning and at bedtime.     No current facility-administered medications on file prior to visit.   Review of Systems  Constitutional:  Negative for chills, fever and malaise/fatigue.  HENT:  Positive for congestion. Negative for ear pain, sinus pain and sore throat.   Eyes:  Negative for blurred vision, discharge and redness.  Respiratory:  Positive for cough. Negative for sputum production, shortness of breath, wheezing and stridor.   Cardiovascular:  Negative for chest pain, palpitations and leg swelling.  Gastrointestinal:  Negative for abdominal pain, diarrhea, nausea and vomiting.  Musculoskeletal:  Negative for myalgias.  Skin:  Negative for rash.  Neurological:  Negative for dizziness and headaches.   Observations/Objective:  Patient appears well, in no distress Weight is baseline  No facial swelling or asymmetry Hoarse voice No obvious tremor or mobility impairment Moving neck and UEs normally Able to hear the call well  No wheez or shortness of breath during interview  Wet sounding cough noted Talkative and mentally sharp with no cognitive changes No skin changes on face or neck , no rash or pallor Affect is normal   Assessment and Plan: Problem List Items Addressed This Visit       Other   COVID-19    covid in day 7- now w/o fever but continued congestion and cough  Cough sounds barky/tight  Px prednisone taper 40 mg  Also tessalon for  cough Need to watch out for worse cough or wheezing or return of fever Update if not starting to improve in a week or if worsening  ER parameters discussed        Follow Up Instructions: Drink fluids and rest  Take the prednisone taper to help with congestion and tight chest/cough  Try tessalon for cough  Nasal saline for congestion  Update if not starting to improve in a week or if worsening   If symptoms become severe or you  are short of breath, go to the ER   I discussed the assessment and treatment plan with the patient. The patient was provided an opportunity to ask questions and all were answered. The patient agreed with the plan and demonstrated an understanding of the instructions.   The patient was advised to call back or seek an in-person evaluation if the symptoms worsen or if the condition fails to improve as anticipated.    Loura Pardon, MD

## 2021-02-04 ENCOUNTER — Telehealth (HOSPITAL_COMMUNITY): Payer: Self-pay | Admitting: *Deleted

## 2021-02-04 ENCOUNTER — Telehealth: Payer: Self-pay | Admitting: Cardiovascular Disease

## 2021-02-04 DIAGNOSIS — R072 Precordial pain: Secondary | ICD-10-CM

## 2021-02-04 MED ORDER — METOPROLOL TARTRATE 100 MG PO TABS: ORAL_TABLET | ORAL | 0 refills | Status: DC

## 2021-02-04 NOTE — Telephone Encounter (Signed)
Patient returning call regarding upcoming cardiac imaging study; pt verbalizes understanding of appt date/time, parking situation and where to check in, pre-test NPO status and medications ordered; name and call back number provided for further questions should they arise  Gordy Clement RN Navigator Cardiac Imaging [place] [facility] [phone] office [phone] cell  Patient to take 100mg  metoprolol tartrate two hours prior to cardiac scan.

## 2021-02-04 NOTE — Telephone Encounter (Signed)
Done

## 2021-02-04 NOTE — Telephone Encounter (Signed)
Attempted to call patient regarding upcoming cardiac CT appointment. °Left message on voicemail with name and callback number ° °Peretz Thieme RN Navigator Cardiac Imaging °[facility] °[phone] Office °[phone] Cell ° °

## 2021-02-04 NOTE — Telephone Encounter (Signed)
Please advise if ok to refill 1 time dose Metoprolol for CT.

## 2021-02-04 NOTE — Telephone Encounter (Signed)
*  STAT* If patient is at the pharmacy, call can be transferred to refill team.   1. Which medications need to be refilled? (please list name of each medication and dose if known) Lopressor .5mg  1 tablet 2hrs prior CTA  2. Which pharmacy/location (including street and city if local pharmacy) is medication to be sent to? [facility] [place]  3. Do they need a 30 day or 90 day supply? 1 day supply

## 2021-02-05 ENCOUNTER — Ambulatory Visit
Admission: RE | Admit: 2021-02-05 | Discharge: 2021-02-05 | Disposition: A | Discharge status: Home / Self Care | Payer: Managed Care, Other (non HMO) | Source: Ambulatory Visit | Attending: Cardiovascular Disease | Admitting: Cardiovascular Disease

## 2021-02-05 ENCOUNTER — Other Ambulatory Visit: Payer: Self-pay

## 2021-02-05 DIAGNOSIS — R072 Precordial pain: Secondary | ICD-10-CM | POA: Insufficient documentation

## 2021-02-05 MED ORDER — NITROGLYCERIN 0.4 MG SL SUBL
0.8000 mg | SUBLINGUAL_TABLET | Freq: Once | SUBLINGUAL | Status: AC
  Administered 2021-02-05: 0.8 mg via SUBLINGUAL

## 2021-02-05 MED ORDER — METOPROLOL TARTRATE 5 MG/5ML IV SOLN
10.0000 mg | Freq: Once | INTRAVENOUS | Status: AC
  Administered 2021-02-05: 10 mg via INTRAVENOUS

## 2021-02-05 MED ORDER — IOHEXOL 350 MG/ML SOLN
75.0000 mL | Freq: Once | INTRAVENOUS | Status: AC | PRN
  Administered 2021-02-05: 75 mL via INTRAVENOUS

## 2021-02-05 NOTE — Final Progress Note (Signed)
Patient tolerated procedure well. Ambulate w/o difficulty. Denies light headedness or being dizzy. Sitting in chair drinking water provided. Encouraged to drink extra water today and reasoning explained. Verbalized understanding. All questions answered. ABC intact. No further needs. Discharge from procedure area w/o issues.   °

## 2021-02-13 ENCOUNTER — Telehealth: Payer: Self-pay | Admitting: Internal Medicine

## 2021-02-13 NOTE — Telephone Encounter (Signed)
Left pt vm to call ofc regarding mammo . thanks

## 2021-04-28 ENCOUNTER — Encounter: Payer: Self-pay | Admitting: Internal Medicine

## 2021-04-28 ENCOUNTER — Telehealth: Payer: Self-pay

## 2021-04-28 NOTE — Telephone Encounter (Signed)
Alyssa Bautista  P [facility] (supporting Einar Pheasant, MD) 35 minutes ago (2:48 PM)   I'm so sorry to be a pest.  I know I missed your call this morning.  I did call right back and the lady I spoke with said she would forward my message back urgently.  Is there any word on what I can or need to do?  I'm really trying to avoid the ED.  I did forget to mention earlier that I cannot take ibuprofen because of the seizure medication I am on... hence the reason I am alternating Tylenol and Aspirin.  Thank you.      Alyssa Bautista routed conversation to Alyssa Masson, LPN 4 hours ago (95:09 AM)   Alyssa Bautista 4 hours ago (10:30 AM)   LW Patient called back , returning a call , Please call  Patient at [phone]

## 2021-04-28 NOTE — Telephone Encounter (Signed)
Good morning Dr. Nicki Reaper.  I "threw out" my back on Sunday just reaching up to put groceries away and I am in so much pain.  I am currently alternating Tylenol and Aspirin with heat.  I tried to take some Baclofen I had left over from a year or two ago hoping it would not make me sick to my stomach, but unfortunately that was not the case.  I took it yesterday afternoon with food and about 45ish minutes later, I vomited.  So I tried again last night and took it after I ate a good size supper, but ended up vomiting again.  I've not felt nauseous at any time, so I do not feel as if this is a GI bug.Marland KitchenMarland KitchenI feel great other than I can't move without terrible pain over my lower back and of course the strain of vomiting twice has not helped.  I can't walk, stand, sit or lay for more than a few minutes in one position so a decent night sleep or even a nap has been out of the question.  What can you do or recommend for relief from this pain?!  I'll come see you, do a virtual visit, go to Urgent Care or I'm almost to the point of going to the ED...although I'd rather not...i just want this to subside so I can get to my chiropractor ASAP!  Please help!

## 2021-04-28 NOTE — Telephone Encounter (Signed)
LMTCB. Needs to go to urgent care

## 2021-04-28 NOTE — Telephone Encounter (Signed)
LMTCB. Needs to go to urgent care.

## 2021-04-28 NOTE — Telephone Encounter (Signed)
Patient called back , returning a call , Please call  Patient at [phone]

## 2021-04-28 NOTE — Telephone Encounter (Signed)
LMTCB

## 2021-04-29 NOTE — Telephone Encounter (Signed)
Per chart. Patient was evaluated

## 2021-04-29 NOTE — Telephone Encounter (Signed)
Per chart review, patient was seen at [place].

## 2021-05-28 ENCOUNTER — Other Ambulatory Visit: Payer: Self-pay | Admitting: Internal Medicine

## 2021-06-03 LAB — HM MAMMOGRAPHY

## 2021-06-22 ENCOUNTER — Encounter: Payer: Self-pay | Admitting: Internal Medicine

## 2021-08-01 ENCOUNTER — Telehealth: Payer: Self-pay | Admitting: Internal Medicine

## 2021-08-01 NOTE — Telephone Encounter (Signed)
She is overdue a f/u appt.  Also due physical after 09/18/21.  Please schedule an appt.  Thanks   ?

## 2021-08-03 NOTE — Telephone Encounter (Signed)
Lm to call office and schedule physical after 5/12 with provider. ?

## 2021-08-18 ENCOUNTER — Other Ambulatory Visit: Payer: Self-pay | Admitting: Internal Medicine

## 2021-08-19 DIAGNOSIS — M62830 Muscle spasm of back: Secondary | ICD-10-CM | POA: Diagnosis not present

## 2021-08-19 DIAGNOSIS — M9907 Segmental and somatic dysfunction of upper extremity: Secondary | ICD-10-CM | POA: Diagnosis not present

## 2021-08-19 DIAGNOSIS — M9901 Segmental and somatic dysfunction of cervical region: Secondary | ICD-10-CM | POA: Diagnosis not present

## 2021-08-19 DIAGNOSIS — M62432 Contracture of muscle, left forearm: Secondary | ICD-10-CM | POA: Diagnosis not present

## 2021-08-19 DIAGNOSIS — M9902 Segmental and somatic dysfunction of thoracic region: Secondary | ICD-10-CM | POA: Diagnosis not present

## 2021-08-19 DIAGNOSIS — R293 Abnormal posture: Secondary | ICD-10-CM | POA: Diagnosis not present

## 2021-08-19 NOTE — Telephone Encounter (Signed)
LMTCB to schedule an appointment will send 30 day supply until patient can be scheduled to be seen.  ?

## 2021-08-20 DIAGNOSIS — R293 Abnormal posture: Secondary | ICD-10-CM | POA: Diagnosis not present

## 2021-08-20 DIAGNOSIS — M62830 Muscle spasm of back: Secondary | ICD-10-CM | POA: Diagnosis not present

## 2021-08-20 DIAGNOSIS — M9907 Segmental and somatic dysfunction of upper extremity: Secondary | ICD-10-CM | POA: Diagnosis not present

## 2021-08-20 DIAGNOSIS — M9902 Segmental and somatic dysfunction of thoracic region: Secondary | ICD-10-CM | POA: Diagnosis not present

## 2021-08-20 DIAGNOSIS — M62432 Contracture of muscle, left forearm: Secondary | ICD-10-CM | POA: Diagnosis not present

## 2021-08-20 DIAGNOSIS — M9901 Segmental and somatic dysfunction of cervical region: Secondary | ICD-10-CM | POA: Diagnosis not present

## 2021-08-25 DIAGNOSIS — M62432 Contracture of muscle, left forearm: Secondary | ICD-10-CM | POA: Diagnosis not present

## 2021-08-25 DIAGNOSIS — M9902 Segmental and somatic dysfunction of thoracic region: Secondary | ICD-10-CM | POA: Diagnosis not present

## 2021-08-25 DIAGNOSIS — M9907 Segmental and somatic dysfunction of upper extremity: Secondary | ICD-10-CM | POA: Diagnosis not present

## 2021-08-25 DIAGNOSIS — R293 Abnormal posture: Secondary | ICD-10-CM | POA: Diagnosis not present

## 2021-08-25 DIAGNOSIS — M9901 Segmental and somatic dysfunction of cervical region: Secondary | ICD-10-CM | POA: Diagnosis not present

## 2021-08-25 DIAGNOSIS — M62830 Muscle spasm of back: Secondary | ICD-10-CM | POA: Diagnosis not present

## 2021-08-31 DIAGNOSIS — M62432 Contracture of muscle, left forearm: Secondary | ICD-10-CM | POA: Diagnosis not present

## 2021-08-31 DIAGNOSIS — M9902 Segmental and somatic dysfunction of thoracic region: Secondary | ICD-10-CM | POA: Diagnosis not present

## 2021-08-31 DIAGNOSIS — R293 Abnormal posture: Secondary | ICD-10-CM | POA: Diagnosis not present

## 2021-08-31 DIAGNOSIS — M9907 Segmental and somatic dysfunction of upper extremity: Secondary | ICD-10-CM | POA: Diagnosis not present

## 2021-08-31 DIAGNOSIS — M9901 Segmental and somatic dysfunction of cervical region: Secondary | ICD-10-CM | POA: Diagnosis not present

## 2021-08-31 DIAGNOSIS — M62830 Muscle spasm of back: Secondary | ICD-10-CM | POA: Diagnosis not present

## 2021-09-01 DIAGNOSIS — M9902 Segmental and somatic dysfunction of thoracic region: Secondary | ICD-10-CM | POA: Diagnosis not present

## 2021-09-01 DIAGNOSIS — M62830 Muscle spasm of back: Secondary | ICD-10-CM | POA: Diagnosis not present

## 2021-09-01 DIAGNOSIS — R293 Abnormal posture: Secondary | ICD-10-CM | POA: Diagnosis not present

## 2021-09-01 DIAGNOSIS — M62432 Contracture of muscle, left forearm: Secondary | ICD-10-CM | POA: Diagnosis not present

## 2021-09-01 DIAGNOSIS — M9907 Segmental and somatic dysfunction of upper extremity: Secondary | ICD-10-CM | POA: Diagnosis not present

## 2021-09-01 DIAGNOSIS — M9901 Segmental and somatic dysfunction of cervical region: Secondary | ICD-10-CM | POA: Diagnosis not present

## 2021-09-02 DIAGNOSIS — M62432 Contracture of muscle, left forearm: Secondary | ICD-10-CM | POA: Diagnosis not present

## 2021-09-02 DIAGNOSIS — M9901 Segmental and somatic dysfunction of cervical region: Secondary | ICD-10-CM | POA: Diagnosis not present

## 2021-09-02 DIAGNOSIS — R293 Abnormal posture: Secondary | ICD-10-CM | POA: Diagnosis not present

## 2021-09-02 DIAGNOSIS — M9902 Segmental and somatic dysfunction of thoracic region: Secondary | ICD-10-CM | POA: Diagnosis not present

## 2021-09-02 DIAGNOSIS — M9907 Segmental and somatic dysfunction of upper extremity: Secondary | ICD-10-CM | POA: Diagnosis not present

## 2021-09-02 DIAGNOSIS — M62830 Muscle spasm of back: Secondary | ICD-10-CM | POA: Diagnosis not present

## 2021-09-09 DIAGNOSIS — M62830 Muscle spasm of back: Secondary | ICD-10-CM | POA: Diagnosis not present

## 2021-09-09 DIAGNOSIS — R293 Abnormal posture: Secondary | ICD-10-CM | POA: Diagnosis not present

## 2021-09-09 DIAGNOSIS — M9907 Segmental and somatic dysfunction of upper extremity: Secondary | ICD-10-CM | POA: Diagnosis not present

## 2021-09-09 DIAGNOSIS — M9902 Segmental and somatic dysfunction of thoracic region: Secondary | ICD-10-CM | POA: Diagnosis not present

## 2021-09-09 DIAGNOSIS — M9901 Segmental and somatic dysfunction of cervical region: Secondary | ICD-10-CM | POA: Diagnosis not present

## 2021-09-09 DIAGNOSIS — M62432 Contracture of muscle, left forearm: Secondary | ICD-10-CM | POA: Diagnosis not present

## 2021-09-10 DIAGNOSIS — M9901 Segmental and somatic dysfunction of cervical region: Secondary | ICD-10-CM | POA: Diagnosis not present

## 2021-09-10 DIAGNOSIS — R293 Abnormal posture: Secondary | ICD-10-CM | POA: Diagnosis not present

## 2021-09-10 DIAGNOSIS — M9907 Segmental and somatic dysfunction of upper extremity: Secondary | ICD-10-CM | POA: Diagnosis not present

## 2021-09-10 DIAGNOSIS — M9902 Segmental and somatic dysfunction of thoracic region: Secondary | ICD-10-CM | POA: Diagnosis not present

## 2021-09-10 DIAGNOSIS — M62432 Contracture of muscle, left forearm: Secondary | ICD-10-CM | POA: Diagnosis not present

## 2021-09-10 DIAGNOSIS — M62830 Muscle spasm of back: Secondary | ICD-10-CM | POA: Diagnosis not present

## 2021-09-15 DIAGNOSIS — R293 Abnormal posture: Secondary | ICD-10-CM | POA: Diagnosis not present

## 2021-09-15 DIAGNOSIS — M62432 Contracture of muscle, left forearm: Secondary | ICD-10-CM | POA: Diagnosis not present

## 2021-09-15 DIAGNOSIS — M62830 Muscle spasm of back: Secondary | ICD-10-CM | POA: Diagnosis not present

## 2021-09-15 DIAGNOSIS — M9901 Segmental and somatic dysfunction of cervical region: Secondary | ICD-10-CM | POA: Diagnosis not present

## 2021-09-15 DIAGNOSIS — M9907 Segmental and somatic dysfunction of upper extremity: Secondary | ICD-10-CM | POA: Diagnosis not present

## 2021-09-15 DIAGNOSIS — M9902 Segmental and somatic dysfunction of thoracic region: Secondary | ICD-10-CM | POA: Diagnosis not present

## 2021-09-16 DIAGNOSIS — M9907 Segmental and somatic dysfunction of upper extremity: Secondary | ICD-10-CM | POA: Diagnosis not present

## 2021-09-16 DIAGNOSIS — M9902 Segmental and somatic dysfunction of thoracic region: Secondary | ICD-10-CM | POA: Diagnosis not present

## 2021-09-16 DIAGNOSIS — R293 Abnormal posture: Secondary | ICD-10-CM | POA: Diagnosis not present

## 2021-09-16 DIAGNOSIS — M9901 Segmental and somatic dysfunction of cervical region: Secondary | ICD-10-CM | POA: Diagnosis not present

## 2021-09-16 DIAGNOSIS — M62830 Muscle spasm of back: Secondary | ICD-10-CM | POA: Diagnosis not present

## 2021-09-16 DIAGNOSIS — M62432 Contracture of muscle, left forearm: Secondary | ICD-10-CM | POA: Diagnosis not present

## 2021-09-24 DIAGNOSIS — M62432 Contracture of muscle, left forearm: Secondary | ICD-10-CM | POA: Diagnosis not present

## 2021-09-24 DIAGNOSIS — M9902 Segmental and somatic dysfunction of thoracic region: Secondary | ICD-10-CM | POA: Diagnosis not present

## 2021-09-24 DIAGNOSIS — M9907 Segmental and somatic dysfunction of upper extremity: Secondary | ICD-10-CM | POA: Diagnosis not present

## 2021-09-24 DIAGNOSIS — R293 Abnormal posture: Secondary | ICD-10-CM | POA: Diagnosis not present

## 2021-09-24 DIAGNOSIS — M9901 Segmental and somatic dysfunction of cervical region: Secondary | ICD-10-CM | POA: Diagnosis not present

## 2021-09-24 DIAGNOSIS — M62830 Muscle spasm of back: Secondary | ICD-10-CM | POA: Diagnosis not present

## 2021-10-04 ENCOUNTER — Other Ambulatory Visit: Payer: Self-pay | Admitting: Internal Medicine

## 2021-10-06 DIAGNOSIS — M62432 Contracture of muscle, left forearm: Secondary | ICD-10-CM | POA: Diagnosis not present

## 2021-10-06 DIAGNOSIS — M9901 Segmental and somatic dysfunction of cervical region: Secondary | ICD-10-CM | POA: Diagnosis not present

## 2021-10-06 DIAGNOSIS — M9902 Segmental and somatic dysfunction of thoracic region: Secondary | ICD-10-CM | POA: Diagnosis not present

## 2021-10-06 DIAGNOSIS — R293 Abnormal posture: Secondary | ICD-10-CM | POA: Diagnosis not present

## 2021-10-06 DIAGNOSIS — M62830 Muscle spasm of back: Secondary | ICD-10-CM | POA: Diagnosis not present

## 2021-10-06 DIAGNOSIS — M9907 Segmental and somatic dysfunction of upper extremity: Secondary | ICD-10-CM | POA: Diagnosis not present

## 2021-10-23 ENCOUNTER — Ambulatory Visit (INDEPENDENT_AMBULATORY_CARE_PROVIDER_SITE_OTHER): Payer: BC Managed Care – PPO | Admitting: Internal Medicine

## 2021-10-23 ENCOUNTER — Encounter: Payer: Self-pay | Admitting: Internal Medicine

## 2021-10-23 VITALS — BP 126/80 | HR 95 | Temp 98.6°F | Resp 16 | Ht 66.0 in | Wt 173.0 lb

## 2021-10-23 DIAGNOSIS — Z13220 Encounter for screening for lipoid disorders: Secondary | ICD-10-CM

## 2021-10-23 DIAGNOSIS — G43809 Other migraine, not intractable, without status migrainosus: Secondary | ICD-10-CM

## 2021-10-23 DIAGNOSIS — Z0000 Encounter for general adult medical examination without abnormal findings: Secondary | ICD-10-CM

## 2021-10-23 DIAGNOSIS — R221 Localized swelling, mass and lump, neck: Secondary | ICD-10-CM

## 2021-10-23 DIAGNOSIS — I341 Nonrheumatic mitral (valve) prolapse: Secondary | ICD-10-CM

## 2021-10-23 DIAGNOSIS — Z9109 Other allergy status, other than to drugs and biological substances: Secondary | ICD-10-CM

## 2021-10-23 DIAGNOSIS — F439 Reaction to severe stress, unspecified: Secondary | ICD-10-CM

## 2021-10-23 DIAGNOSIS — E538 Deficiency of other specified B group vitamins: Secondary | ICD-10-CM | POA: Diagnosis not present

## 2021-10-23 DIAGNOSIS — G40909 Epilepsy, unspecified, not intractable, without status epilepticus: Secondary | ICD-10-CM

## 2021-10-23 NOTE — Progress Notes (Unsigned)
Patient ID: Alyssa Bautista, female   DOB: [birth year], 51 y.o.   MRN: [id number]   Subjective:    Patient ID: Alyssa Bautista, female    DOB: 1970/09/10, 51 y.o.   MRN: [id number]  This visit occurred during the SARS-CoV-2 public health emergency.  Safety protocols were in place, including screening questions prior to the visit, additional usage of staff PPE, and extensive cleaning of exam room while observing appropriate contact time as indicated for disinfecting solutions.   Patient here for  Chief Complaint  Patient presents with   Annual Exam    Yearly Exam   .   HPI    Past Medical History:  Diagnosis Date   Anxiety    Arthritis    Depression    Environmental allergies    Gestational diabetes    History of chicken pox    Migraines    MVP (mitral valve prolapse)    Seizures ([place])    Past Surgical History:  Procedure Laterality Date   ABDOMINAL HYSTERECTOMY  02/2011   abnormal pap (stage III dysplasia).   ovaries not removed.   abscess removal  2007   cervix    DILATION AND CURETTAGE OF UTERUS  2002, 2005   LEEP  2008   WISDOM TOOTH EXTRACTION  2003   Family History  Problem Relation Age of Onset   Hypercholesterolemia Mother    Hypercholesterolemia Father    Diabetes Paternal Grandfather    Prostate cancer Paternal Grandfather    Breast cancer Maternal Aunt        50's   Colon cancer Neg Hx    Social History   Socioeconomic History   Marital status: Divorced    Spouse name: Not on file   Number of children: 2   Years of education: Not on file   Highest education level: Not on file  Occupational History   Not on file  Tobacco Use   Smoking status: Never   Smokeless tobacco: Never  Substance and Sexual Activity   Alcohol use: No    Alcohol/week: 0.0 standard drinks of alcohol    Comment: rarely   Drug use: No   Sexual activity: Not on file  Other Topics Concern   Not on file  Social History Narrative   Not on file   Social  Determinants of Health   Financial Resource Strain: Not on file  Food Insecurity: Not on file  Transportation Needs: Not on file  Physical Activity: Not on file  Stress: Not on file  Social Connections: Not on file     Review of Systems     Objective:     BP 126/80 (BP Location: Left Arm, Patient Position: Sitting, Cuff Size: Small)   Pulse 95   Temp 98.6 F (37 C) (Temporal)   Resp 16   Ht '5\' 6"'$  (1.676 m)   Wt 173 lb (78.5 kg)   LMP 02/24/2011   SpO2 97%   BMI 27.92 kg/m  Wt Readings from Last 3 Encounters:  10/23/21 173 lb (78.5 kg)  01/01/21 161 lb 8 oz (73.3 kg)  09/18/20 165 lb 12.8 oz (75.2 kg)    Physical Exam   Outpatient Encounter Medications as of 10/23/2021  Medication Sig   acetaminophen (TYLENOL) 500 MG tablet Take 500 mg by mouth as needed.   aspirin 325 MG EC tablet Take 325 mg by mouth every 6 (six) hours as needed for pain.   cyanocobalamin (,VITAMIN B-12,) 1000 MCG/ML injection Inject 1 mL  into the muscle once weekly for 4 weeks and then once every 30 days.   escitalopram (LEXAPRO) 10 MG tablet TAKE 1 AND 1/2 TABLETS(15 MG) BY MOUTH DAILY   levocetirizine (XYZAL) 5 MG tablet TAKE 1 TABLET(5 MG) BY MOUTH EVERY EVENING   LORazepam (ATIVAN) 0.5 MG tablet 1/2 tablet q day prn   montelukast (SINGULAIR) 10 MG tablet TAKE 1 TABLET(10 MG) BY MOUTH AT BEDTIME   phenytoin (DILANTIN) 100 MG ER capsule Take 1 capsule (100 mg total) by mouth 3 (three) times daily.   promethazine (PHENERGAN) 25 MG tablet Take by mouth.   SUMAtriptan (IMITREX) 50 MG tablet Take by mouth.   SYRINGE-NEEDLE, DISP, 3 ML (B-D 3CC LUER-LOK SYR 25GX1") 25G X 1" 3 ML MISC USE AS DIRECTED   topiramate (TOPAMAX) 50 MG tablet Take 0.5 mg by mouth in the morning and at bedtime.   [DISCONTINUED] benzonatate (TESSALON) 200 MG capsule Take 1 capsule (200 mg total) by mouth 3 (three) times daily as needed for cough. Swallow whole, do not bite pill   [DISCONTINUED] metoprolol tartrate (LOPRESSOR)  100 MG tablet Take 1 tablet (100 mg) 2 hours prior to your Cardiac CTA   [DISCONTINUED] predniSONE (DELTASONE) 10 MG tablet Take 4 pills once daily by mouth for 3 days, then 3 pills daily for 3 days, then 2 pills daily for 3 days then 1 pill daily for 3 days then stop (Patient not taking: Reported on 02/05/2021)   No facility-administered encounter medications on file as of 10/23/2021.     Lab Results  Component Value Date   WBC 6.0 10/02/2020   HGB 12.8 10/02/2020   HCT 37.7 10/02/2020   PLT 296.0 10/02/2020   GLUCOSE 86 10/02/2020   CHOL 207 (H) 10/02/2020   TRIG 94.0 10/02/2020   HDL 65.50 10/02/2020   LDLCALC 123 (H) 10/02/2020   ALT 17 10/02/2020   AST 14 10/02/2020   NA 139 10/02/2020   K 4.7 10/02/2020   CL 105 10/02/2020   CREATININE 0.72 10/02/2020   BUN 8 10/02/2020   CO2 29 10/02/2020   TSH 2.21 10/02/2020    CT CORONARY MORPH W/CTA COR W/SCORE W/CA W/CM &/OR WO/CM  Addendum Date: 02/05/2021   ADDENDUM REPORT: 02/05/2021 16:22 EXAM: OVER-READ INTERPRETATION  CT CHEST The following report is an over-read performed by radiologist Dr. Norlene Duel [facility] on 02/05/2021. This over-read does not include interpretation of cardiac or coronary anatomy or pathology. The coronary CTA interpretation by the cardiologist is attached. COMPARISON:  None. FINDINGS: No mediastinal mass or adenopathy. Subsegmental atelectasis or scar noted in the left lower lobe. No pleural effusion, airspace consolidation, or atelectasis. No acute abnormality noted within the limited images through the upper abdomen. No acute or suspicious osseous findings. IMPRESSION: 1. No significant noncardiac supplemental findings. 2. Subsegmental atelectasis or scar noted in the left lower lobe. Electronically Signed   By: Kerby Moors M.D.   On: 02/05/2021 16:22   Result Date: 02/05/2021 CLINICAL DATA:  Chest pain EXAM: Cardiac/Coronary  CTA TECHNIQUE: The patient was scanned on a Siemens Somatom  go.Top scanner. : A retrospective scan was triggered in the descending thoracic aorta. Axial non-contrast 3 mm slices were carried out through the heart. The data set was analyzed on a dedicated work station and scored using the [place]. Gantry rotation speed was 330 msecs and collimation was .6 mm. '100mg'$  of metoprolol and 0.8 mg of sl NTG was given. The 3D data set was reconstructed in 5%  intervals of the 60-95 % of the R-R cycle. Diastolic phases were analyzed on a dedicated work station using MPR, MIP and VRT modes. The patient received 75 cc of contrast. FINDINGS: Aorta:  Normal size.  No calcifications.  No dissection. Aortic Valve:  Trileaflet.  No calcifications. Coronary Arteries:  Normal coronary origin.  Right dominance. RCA is a dominant artery that gives rise to PDA and PLA. There is no disease. Left main is a large artery that gives rise to LAD and LCX arteries. No LM disease is noted LAD has no plaque. LCX is a non-dominant artery that gives rise to two obtuse marginal branches. There is no plaque. Other findings: Normal pulmonary vein drainage into the left atrium. Normal left atrial appendage without a thrombus. Normal size of the pulmonary artery. IMPRESSION: 1. Normal coronary calcium score of 0. Patient is low risk for coronary events. 2. Normal coronary origin with right dominance. 3. No evidence of CAD. 4. CAD-RADS 0.  Consider non-atherosclerotic causes of chest pain. Electronically Signed: By: Kate Sable M.D. On: 02/05/2021 14:49       Assessment & Plan:   Problem List Items Addressed This Visit     B12 deficiency - Primary   MVP (mitral valve prolapse)   Neck nodule   Other Visit Diagnoses     Screening cholesterol level            Einar Pheasant, MD

## 2021-10-24 ENCOUNTER — Encounter: Payer: Self-pay | Admitting: Internal Medicine

## 2021-10-24 MED ORDER — "BD LUER-LOK SYRINGE 25G X 1"" 3 ML MISC": 0 refills | Status: DC

## 2021-10-24 MED ORDER — ESCITALOPRAM OXALATE 10 MG PO TABS: ORAL_TABLET | ORAL | 3 refills | Status: DC

## 2021-10-24 MED ORDER — CYANOCOBALAMIN 1000 MCG/ML IJ SOLN: INTRAMUSCULAR | 0 refills | Status: DC

## 2021-10-24 NOTE — Assessment & Plan Note (Signed)
Has been on B12 injections.  Check B12 level with next labs.   

## 2021-10-24 NOTE — Assessment & Plan Note (Signed)
On topamax.  Seeing neurology.  Has imitrex if needed.  Follow.

## 2021-10-24 NOTE — Assessment & Plan Note (Signed)
Continue xyzal and singulair.

## 2021-10-24 NOTE — Assessment & Plan Note (Signed)
Continue dilantin.  Stable.   

## 2021-10-24 NOTE — Assessment & Plan Note (Signed)
ECHO with moderate MR.  Saw Dr Arida 12/2020.  Calcium score 0.  Continue f/u with cardiology.  

## 2021-10-24 NOTE — Assessment & Plan Note (Signed)
On lexapro.  Overall she feels she is doing well.  Follow.  

## 2021-10-24 NOTE — Assessment & Plan Note (Signed)
Physical today 10/23/21.  PAP 08/13/19 - negative with negative HPV.  S/p hysterectomy.  Mammogram 06/03/21 - Birads I.  Discussed colonoscopy and screening for colon cancer.  Will notify me when agreeable.

## 2021-11-02 ENCOUNTER — Other Ambulatory Visit (INDEPENDENT_AMBULATORY_CARE_PROVIDER_SITE_OTHER): Payer: BC Managed Care – PPO

## 2021-11-02 DIAGNOSIS — E538 Deficiency of other specified B group vitamins: Secondary | ICD-10-CM

## 2021-11-02 DIAGNOSIS — G40909 Epilepsy, unspecified, not intractable, without status epilepticus: Secondary | ICD-10-CM

## 2021-11-02 DIAGNOSIS — Z13220 Encounter for screening for lipoid disorders: Secondary | ICD-10-CM | POA: Diagnosis not present

## 2021-11-02 DIAGNOSIS — F439 Reaction to severe stress, unspecified: Secondary | ICD-10-CM

## 2021-11-02 DIAGNOSIS — I341 Nonrheumatic mitral (valve) prolapse: Secondary | ICD-10-CM

## 2021-11-02 LAB — CBC WITH DIFFERENTIAL/PLATELET
Basophils Absolute: 0 10*3/uL (ref 0.0–0.1)
Basophils Relative: 0.4 % (ref 0.0–3.0)
Eosinophils Absolute: 0.2 10*3/uL (ref 0.0–0.7)
Eosinophils Relative: 3.6 % (ref 0.0–5.0)
HCT: 39.8 % (ref 36.0–46.0)
Hemoglobin: 13.5 g/dL (ref 12.0–15.0)
Lymphocytes Relative: 38.4 % (ref 12.0–46.0)
Lymphs Abs: 2.1 10*3/uL (ref 0.7–4.0)
MCHC: 33.9 g/dL (ref 30.0–36.0)
MCV: 99.7 fl (ref 78.0–100.0)
Monocytes Absolute: 0.3 10*3/uL (ref 0.1–1.0)
Monocytes Relative: 6 % (ref 3.0–12.0)
Neutro Abs: 2.9 10*3/uL (ref 1.4–7.7)
Neutrophils Relative %: 51.6 % (ref 43.0–77.0)
Platelets: 322 10*3/uL (ref 150.0–400.0)
RBC: 3.99 Mil/uL (ref 3.87–5.11)
RDW: 13.7 % (ref 11.5–15.5)
WBC: 5.5 10*3/uL (ref 4.0–10.5)

## 2021-11-02 LAB — COMPREHENSIVE METABOLIC PANEL
ALT: 16 U/L (ref 0–35)
AST: 14 U/L (ref 0–37)
Albumin: 4.2 g/dL (ref 3.5–5.2)
Alkaline Phosphatase: 85 U/L (ref 39–117)
BUN: 13 mg/dL (ref 6–23)
CO2: 29 mEq/L (ref 19–32)
Calcium: 9 mg/dL (ref 8.4–10.5)
Chloride: 105 mEq/L (ref 96–112)
Creatinine, Ser: 0.92 mg/dL (ref 0.40–1.20)
GFR: 73.51 mL/min (ref >60.00)
Glucose, Bld: 100 mg/dL — ABNORMAL HIGH (ref 70–99)
Potassium: 4 mEq/L (ref 3.5–5.1)
Sodium: 141 mEq/L (ref 135–145)
Total Bilirubin: 0.3 mg/dL (ref 0.2–1.2)
Total Protein: 7 g/dL (ref 6.0–8.3)

## 2021-11-02 LAB — TSH: TSH: 4.59 u[IU]/mL (ref 0.35–5.50)

## 2021-11-02 LAB — LIPID PANEL
Cholesterol: 236 mg/dL — ABNORMAL HIGH (ref 0–200)
HDL: 76.7 mg/dL (ref >39.00)
LDL Cholesterol: 136 mg/dL — ABNORMAL HIGH (ref 0–99)
NonHDL: 159.79
Total CHOL/HDL Ratio: 3
Triglycerides: 117 mg/dL (ref 0.0–149.0)
VLDL: 23.4 mg/dL (ref 0.0–40.0)

## 2021-11-02 LAB — VITAMIN B12: Vitamin B-12: 191 pg/mL — ABNORMAL LOW (ref 211–911)

## 2021-11-03 DIAGNOSIS — J3089 Other allergic rhinitis: Secondary | ICD-10-CM | POA: Diagnosis not present

## 2021-11-03 LAB — PHENYTOIN LEVEL, TOTAL: Phenytoin, Total: 12.1 mg/L (ref 10.0–20.0)

## 2021-11-16 DIAGNOSIS — J301 Allergic rhinitis due to pollen: Secondary | ICD-10-CM | POA: Diagnosis not present

## 2021-11-17 DIAGNOSIS — J3081 Allergic rhinitis due to animal (cat) (dog) hair and dander: Secondary | ICD-10-CM | POA: Diagnosis not present

## 2021-11-17 DIAGNOSIS — J3089 Other allergic rhinitis: Secondary | ICD-10-CM | POA: Diagnosis not present

## 2021-11-26 DIAGNOSIS — J301 Allergic rhinitis due to pollen: Secondary | ICD-10-CM | POA: Diagnosis not present

## 2021-11-26 DIAGNOSIS — J3081 Allergic rhinitis due to animal (cat) (dog) hair and dander: Secondary | ICD-10-CM | POA: Diagnosis not present

## 2021-11-26 DIAGNOSIS — J3089 Other allergic rhinitis: Secondary | ICD-10-CM | POA: Diagnosis not present

## 2021-12-03 DIAGNOSIS — J3081 Allergic rhinitis due to animal (cat) (dog) hair and dander: Secondary | ICD-10-CM | POA: Diagnosis not present

## 2021-12-03 DIAGNOSIS — J301 Allergic rhinitis due to pollen: Secondary | ICD-10-CM | POA: Diagnosis not present

## 2021-12-03 DIAGNOSIS — J3089 Other allergic rhinitis: Secondary | ICD-10-CM | POA: Diagnosis not present

## 2021-12-08 DIAGNOSIS — J3081 Allergic rhinitis due to animal (cat) (dog) hair and dander: Secondary | ICD-10-CM | POA: Diagnosis not present

## 2021-12-08 DIAGNOSIS — J3089 Other allergic rhinitis: Secondary | ICD-10-CM | POA: Diagnosis not present

## 2021-12-08 DIAGNOSIS — J301 Allergic rhinitis due to pollen: Secondary | ICD-10-CM | POA: Diagnosis not present

## 2021-12-11 DIAGNOSIS — J3089 Other allergic rhinitis: Secondary | ICD-10-CM | POA: Diagnosis not present

## 2021-12-11 DIAGNOSIS — J301 Allergic rhinitis due to pollen: Secondary | ICD-10-CM | POA: Diagnosis not present

## 2021-12-11 DIAGNOSIS — J3081 Allergic rhinitis due to animal (cat) (dog) hair and dander: Secondary | ICD-10-CM | POA: Diagnosis not present

## 2021-12-15 DIAGNOSIS — J3089 Other allergic rhinitis: Secondary | ICD-10-CM | POA: Diagnosis not present

## 2021-12-15 DIAGNOSIS — J3081 Allergic rhinitis due to animal (cat) (dog) hair and dander: Secondary | ICD-10-CM | POA: Diagnosis not present

## 2021-12-15 DIAGNOSIS — J301 Allergic rhinitis due to pollen: Secondary | ICD-10-CM | POA: Diagnosis not present

## 2021-12-17 DIAGNOSIS — J301 Allergic rhinitis due to pollen: Secondary | ICD-10-CM | POA: Diagnosis not present

## 2021-12-17 DIAGNOSIS — J3081 Allergic rhinitis due to animal (cat) (dog) hair and dander: Secondary | ICD-10-CM | POA: Diagnosis not present

## 2021-12-17 DIAGNOSIS — J3089 Other allergic rhinitis: Secondary | ICD-10-CM | POA: Diagnosis not present

## 2021-12-20 ENCOUNTER — Encounter: Payer: Self-pay | Admitting: Internal Medicine

## 2021-12-21 ENCOUNTER — Telehealth: Payer: Self-pay

## 2021-12-21 NOTE — Telephone Encounter (Signed)
Called pt in regards to her wanting an appt with Dr. Nicki Reaper to discuss Wegovy:  Alyssa Bautista  P [facility] (supporting Einar Pheasant, MD) 3 hours ago (10:09 AM)    Yes please.    Earlyne Iba, CMA  Alyssa Bautista 4 hours ago (8:59 AM)    Can I make you an appointment with Dr. Nicki Reaper to discuss the Ardean Larsen, CMA     Alyssa Bautista  P [facility] (supporting Einar Pheasant, MD) 14 hours ago (11:16 PM)    Hey Dr. Nicki Reaper.  I wanted to let you know I have completely stopped sodas since July 1st!  I am drinking mostly water, with a glass of juice or milk a couple times a week and my husband and I have both started back walking... and definitely snacking better and working on my meal preps.  I was definitely concerned when I saw my labs, as I'm sure you were too.  Here's my question.Marland KitchenMarland KitchenI know my weight is not going to change quickly, but I figured I would start to see a little improvement by now.  Unfortunately, I've actually gained a couple of pounds.  My husband and I were talking about it tonight and I wanted to ask you about Wegovy?  I can't continue to gain weight; my back and knees are bothering me again and it's obviously affecting my cholesterol and who knows what else.  Is Wegovy an option for me? Thank you,  Alyssa Bautista

## 2021-12-22 DIAGNOSIS — J301 Allergic rhinitis due to pollen: Secondary | ICD-10-CM | POA: Diagnosis not present

## 2021-12-22 DIAGNOSIS — J3081 Allergic rhinitis due to animal (cat) (dog) hair and dander: Secondary | ICD-10-CM | POA: Diagnosis not present

## 2021-12-22 DIAGNOSIS — J3089 Other allergic rhinitis: Secondary | ICD-10-CM | POA: Diagnosis not present

## 2021-12-25 DIAGNOSIS — J301 Allergic rhinitis due to pollen: Secondary | ICD-10-CM | POA: Diagnosis not present

## 2021-12-25 DIAGNOSIS — J3089 Other allergic rhinitis: Secondary | ICD-10-CM | POA: Diagnosis not present

## 2021-12-25 DIAGNOSIS — J3081 Allergic rhinitis due to animal (cat) (dog) hair and dander: Secondary | ICD-10-CM | POA: Diagnosis not present

## 2021-12-29 DIAGNOSIS — J3089 Other allergic rhinitis: Secondary | ICD-10-CM | POA: Diagnosis not present

## 2021-12-29 DIAGNOSIS — J3081 Allergic rhinitis due to animal (cat) (dog) hair and dander: Secondary | ICD-10-CM | POA: Diagnosis not present

## 2021-12-29 DIAGNOSIS — J301 Allergic rhinitis due to pollen: Secondary | ICD-10-CM | POA: Diagnosis not present

## 2021-12-31 DIAGNOSIS — G43719 Chronic migraine without aura, intractable, without status migrainosus: Secondary | ICD-10-CM | POA: Diagnosis not present

## 2021-12-31 DIAGNOSIS — G40109 Localization-related (focal) (partial) symptomatic epilepsy and epileptic syndromes with simple partial seizures, not intractable, without status epilepticus: Secondary | ICD-10-CM | POA: Diagnosis not present

## 2021-12-31 DIAGNOSIS — E559 Vitamin D deficiency, unspecified: Secondary | ICD-10-CM | POA: Diagnosis not present

## 2021-12-31 DIAGNOSIS — E538 Deficiency of other specified B group vitamins: Secondary | ICD-10-CM | POA: Diagnosis not present

## 2022-01-01 DIAGNOSIS — J301 Allergic rhinitis due to pollen: Secondary | ICD-10-CM | POA: Diagnosis not present

## 2022-01-01 DIAGNOSIS — J3081 Allergic rhinitis due to animal (cat) (dog) hair and dander: Secondary | ICD-10-CM | POA: Diagnosis not present

## 2022-01-01 DIAGNOSIS — J3089 Other allergic rhinitis: Secondary | ICD-10-CM | POA: Diagnosis not present

## 2022-01-05 DIAGNOSIS — J3089 Other allergic rhinitis: Secondary | ICD-10-CM | POA: Diagnosis not present

## 2022-01-05 DIAGNOSIS — J301 Allergic rhinitis due to pollen: Secondary | ICD-10-CM | POA: Diagnosis not present

## 2022-01-05 DIAGNOSIS — J3081 Allergic rhinitis due to animal (cat) (dog) hair and dander: Secondary | ICD-10-CM | POA: Diagnosis not present

## 2022-01-08 DIAGNOSIS — J3081 Allergic rhinitis due to animal (cat) (dog) hair and dander: Secondary | ICD-10-CM | POA: Diagnosis not present

## 2022-01-08 DIAGNOSIS — J301 Allergic rhinitis due to pollen: Secondary | ICD-10-CM | POA: Diagnosis not present

## 2022-01-08 DIAGNOSIS — J3089 Other allergic rhinitis: Secondary | ICD-10-CM | POA: Diagnosis not present

## 2022-01-12 DIAGNOSIS — J301 Allergic rhinitis due to pollen: Secondary | ICD-10-CM | POA: Diagnosis not present

## 2022-01-12 DIAGNOSIS — J3081 Allergic rhinitis due to animal (cat) (dog) hair and dander: Secondary | ICD-10-CM | POA: Diagnosis not present

## 2022-01-12 DIAGNOSIS — J3089 Other allergic rhinitis: Secondary | ICD-10-CM | POA: Diagnosis not present

## 2022-01-15 DIAGNOSIS — J3081 Allergic rhinitis due to animal (cat) (dog) hair and dander: Secondary | ICD-10-CM | POA: Diagnosis not present

## 2022-01-15 DIAGNOSIS — J301 Allergic rhinitis due to pollen: Secondary | ICD-10-CM | POA: Diagnosis not present

## 2022-01-15 DIAGNOSIS — J3089 Other allergic rhinitis: Secondary | ICD-10-CM | POA: Diagnosis not present

## 2022-01-19 DIAGNOSIS — J301 Allergic rhinitis due to pollen: Secondary | ICD-10-CM | POA: Diagnosis not present

## 2022-01-19 DIAGNOSIS — J3081 Allergic rhinitis due to animal (cat) (dog) hair and dander: Secondary | ICD-10-CM | POA: Diagnosis not present

## 2022-01-19 DIAGNOSIS — J3089 Other allergic rhinitis: Secondary | ICD-10-CM | POA: Diagnosis not present

## 2022-01-22 DIAGNOSIS — J301 Allergic rhinitis due to pollen: Secondary | ICD-10-CM | POA: Diagnosis not present

## 2022-01-22 DIAGNOSIS — J3089 Other allergic rhinitis: Secondary | ICD-10-CM | POA: Diagnosis not present

## 2022-01-22 DIAGNOSIS — J3081 Allergic rhinitis due to animal (cat) (dog) hair and dander: Secondary | ICD-10-CM | POA: Diagnosis not present

## 2022-01-29 DIAGNOSIS — J3089 Other allergic rhinitis: Secondary | ICD-10-CM | POA: Diagnosis not present

## 2022-01-29 DIAGNOSIS — J3081 Allergic rhinitis due to animal (cat) (dog) hair and dander: Secondary | ICD-10-CM | POA: Diagnosis not present

## 2022-01-29 DIAGNOSIS — J301 Allergic rhinitis due to pollen: Secondary | ICD-10-CM | POA: Diagnosis not present

## 2022-02-05 DIAGNOSIS — J3081 Allergic rhinitis due to animal (cat) (dog) hair and dander: Secondary | ICD-10-CM | POA: Diagnosis not present

## 2022-02-05 DIAGNOSIS — J3089 Other allergic rhinitis: Secondary | ICD-10-CM | POA: Diagnosis not present

## 2022-02-05 DIAGNOSIS — J301 Allergic rhinitis due to pollen: Secondary | ICD-10-CM | POA: Diagnosis not present

## 2022-02-09 ENCOUNTER — Encounter: Payer: Self-pay | Admitting: Internal Medicine

## 2022-02-09 ENCOUNTER — Ambulatory Visit: Payer: BC Managed Care – PPO | Admitting: Internal Medicine

## 2022-02-09 VITALS — BP 138/78 | HR 92 | Temp 99.1°F | Ht 66.0 in | Wt 177.8 lb

## 2022-02-09 DIAGNOSIS — I341 Nonrheumatic mitral (valve) prolapse: Secondary | ICD-10-CM

## 2022-02-09 DIAGNOSIS — G40909 Epilepsy, unspecified, not intractable, without status epilepticus: Secondary | ICD-10-CM

## 2022-02-09 DIAGNOSIS — G43809 Other migraine, not intractable, without status migrainosus: Secondary | ICD-10-CM

## 2022-02-09 DIAGNOSIS — Z23 Encounter for immunization: Secondary | ICD-10-CM | POA: Diagnosis not present

## 2022-02-09 DIAGNOSIS — F439 Reaction to severe stress, unspecified: Secondary | ICD-10-CM | POA: Diagnosis not present

## 2022-02-09 DIAGNOSIS — Z713 Dietary counseling and surveillance: Secondary | ICD-10-CM

## 2022-02-09 NOTE — Progress Notes (Signed)
Patient ID: [name], female   DOB: 1971-04-10, 51 y.o.   MRN: [id number]   Subjective:    Patient ID: [name], female    DOB: 12-29-1970, 51 y.o.   MRN: [id number]   Patient here for  Chief Complaint  Patient presents with   Follow-up    Discussion about [facility]   .   HPI Concern regarding her weight.  Has decreased her soda intake.  Drinking 60-80 ounces of water per day.  Watching her diet.  Walking.  Is concerned not losing weight.  Frustrated.  Wanted to discuss medication.  Is on topamax for headaches.  Dose just recently increased.  She had questions about GLP-1 agonist.  Discussed the medication and possible side effects.  She is not diabetic. BMI 28.  Discussed diet and exercise.  Discussed weight management referral.  Of note, saw Dr Manuella Ghazi 12/31/21 - f/u migraine.  Topamax increased.  Recommended continuing dilantin.  No seizures.  No chest pain or sob reported.  No abdominal pain.  Bowels moving.    Past Medical History:  Diagnosis Date   Anxiety    Arthritis    Depression    Environmental allergies    Gestational diabetes    History of chicken pox    Migraines    MVP (mitral valve prolapse)    Seizures ([place])    Past Surgical History:  Procedure Laterality Date   ABDOMINAL HYSTERECTOMY  02/2011   abnormal pap (stage III dysplasia).   ovaries not removed.   abscess removal  2007   cervix    DILATION AND CURETTAGE OF UTERUS  2002, 2005   LEEP  2008   WISDOM TOOTH EXTRACTION  2003   Family History  Problem Relation Age of Onset   Hypercholesterolemia Mother    Hypercholesterolemia Father    Diabetes Paternal Grandfather    Prostate cancer Paternal Grandfather    Breast cancer Maternal Aunt        50's   Colon cancer Neg Hx    Social History   Socioeconomic History   Marital status: Divorced    Spouse name: Not on file   Number of children: 2   Years of education: Not on file   Highest education level: Not on file  Occupational  History   Not on file  Tobacco Use   Smoking status: Never   Smokeless tobacco: Never  Substance and Sexual Activity   Alcohol use: No    Alcohol/week: 0.0 standard drinks of alcohol    Comment: rarely   Drug use: No   Sexual activity: Not on file  Other Topics Concern   Not on file  Social History Narrative   Not on file   Social Determinants of Health   Financial Resource Strain: Not on file  Food Insecurity: Not on file  Transportation Needs: Not on file  Physical Activity: Not on file  Stress: Not on file  Social Connections: Not on file     Review of Systems  Constitutional:  Negative for appetite change and unexpected weight change.  HENT:  Negative for congestion and sinus pressure.   Respiratory:  Negative for cough, chest tightness and shortness of breath.   Cardiovascular:  Negative for chest pain, palpitations and leg swelling.  Gastrointestinal:  Negative for abdominal pain, diarrhea, nausea and vomiting.  Genitourinary:  Negative for difficulty urinating and dysuria.  Musculoskeletal:  Negative for joint swelling and myalgias.  Skin:  Negative for color change and rash.  Neurological:  Negative for dizziness, light-headedness and headaches.  Psychiatric/Behavioral:  Negative for agitation and dysphoric mood.        Objective:     BP 138/78 (BP Location: Left Arm, Patient Position: Sitting, Cuff Size: Normal)   Pulse 92   Temp 99.1 F (37.3 C) (Oral)   Ht '5\' 6"'$  (1.676 m)   Wt 177 lb 12.8 oz (80.6 kg)   LMP 02/24/2011   SpO2 97%   BMI 28.70 kg/m  Wt Readings from Last 3 Encounters:  02/09/22 177 lb 12.8 oz (80.6 kg)  10/23/21 173 lb (78.5 kg)  01/01/21 161 lb 8 oz (73.3 kg)    Physical Exam Vitals reviewed.  Constitutional:      General: She is not in acute distress.    Appearance: Normal appearance.  HENT:     Head: Normocephalic and atraumatic.     Right Ear: External ear normal.     Left Ear: External ear normal.  Eyes:     General:  No scleral icterus.       Right eye: No discharge.        Left eye: No discharge.     Conjunctiva/sclera: Conjunctivae normal.  Neck:     Thyroid: No thyromegaly.  Cardiovascular:     Rate and Rhythm: Normal rate and regular rhythm.  Pulmonary:     Effort: No respiratory distress.     Breath sounds: Normal breath sounds. No wheezing.  Abdominal:     General: Bowel sounds are normal.     Palpations: Abdomen is soft.     Tenderness: There is no abdominal tenderness.  Musculoskeletal:        General: No swelling or tenderness.     Cervical back: Neck supple. No tenderness.  Lymphadenopathy:     Cervical: No cervical adenopathy.  Skin:    Findings: No erythema or rash.  Neurological:     Mental Status: She is alert.  Psychiatric:        Mood and Affect: Mood normal.        Behavior: Behavior normal.      Outpatient Encounter Medications as of 02/09/2022  Medication Sig   acetaminophen (TYLENOL) 500 MG tablet Take 500 mg by mouth as needed.   aspirin 325 MG EC tablet Take 325 mg by mouth every 6 (six) hours as needed for pain.   cyanocobalamin (,VITAMIN B-12,) 1000 MCG/ML injection inject once every 30 days.   escitalopram (LEXAPRO) 10 MG tablet TAKE 1 AND 1/2 TABLETS(15 MG) BY MOUTH DAILY   levocetirizine (XYZAL) 5 MG tablet TAKE 1 TABLET(5 MG) BY MOUTH EVERY EVENING   LORazepam (ATIVAN) 0.5 MG tablet 1/2 tablet q day prn   montelukast (SINGULAIR) 10 MG tablet TAKE 1 TABLET(10 MG) BY MOUTH AT BEDTIME   phenytoin (DILANTIN) 100 MG ER capsule Take 1 capsule (100 mg total) by mouth 3 (three) times daily.   promethazine (PHENERGAN) 25 MG tablet Take by mouth.   SUMAtriptan (IMITREX) 50 MG tablet Take by mouth.   SYRINGE-NEEDLE, DISP, 3 ML (B-D 3CC LUER-LOK SYR 25GX1") 25G X 1" 3 ML MISC USE AS DIRECTED   topiramate (TOPAMAX) 50 MG tablet Take 0.5 mg by mouth in the morning and at bedtime.   No facility-administered encounter medications on file as of 02/09/2022.     Lab Results   Component Value Date   WBC 5.5 11/02/2021   HGB 13.5 11/02/2021   HCT 39.8 11/02/2021   PLT 322.0 11/02/2021   GLUCOSE 100 (  H) 11/02/2021   CHOL 236 (H) 11/02/2021   TRIG 117.0 11/02/2021   HDL 76.70 11/02/2021   LDLCALC 136 (H) 11/02/2021   ALT 16 11/02/2021   AST 14 11/02/2021   NA 141 11/02/2021   K 4.0 11/02/2021   CL 105 11/02/2021   CREATININE 0.92 11/02/2021   BUN 13 11/02/2021   CO2 29 11/02/2021   TSH 4.59 11/02/2021    CT CORONARY MORPH W/CTA COR W/SCORE W/CA W/CM &/OR WO/CM  Addendum Date: 02/05/2021   ADDENDUM REPORT: 02/05/2021 16:22 EXAM: OVER-READ INTERPRETATION  CT CHEST The following report is an over-read performed by radiologist Dr. Norlene Duel [facility] on 02/05/2021. This over-read does not include interpretation of cardiac or coronary anatomy or pathology. The coronary CTA interpretation by the cardiologist is attached. COMPARISON:  None. FINDINGS: No mediastinal mass or adenopathy. Subsegmental atelectasis or scar noted in the left lower lobe. No pleural effusion, airspace consolidation, or atelectasis. No acute abnormality noted within the limited images through the upper abdomen. No acute or suspicious osseous findings. IMPRESSION: 1. No significant noncardiac supplemental findings. 2. Subsegmental atelectasis or scar noted in the left lower lobe. Electronically Signed   By: Kerby Moors M.D.   On: 02/05/2021 16:22   Result Date: 02/05/2021 CLINICAL DATA:  Chest pain EXAM: Cardiac/Coronary  CTA TECHNIQUE: The patient was scanned on a Siemens Somatom go.Top scanner. : A retrospective scan was triggered in the descending thoracic aorta. Axial non-contrast 3 mm slices were carried out through the heart. The data set was analyzed on a dedicated work station and scored using the [place]. Gantry rotation speed was 330 msecs and collimation was .6 mm. '100mg'$  of metoprolol and 0.8 mg of sl NTG was given. The 3D data set was reconstructed in 5%  intervals of the 60-95 % of the R-R cycle. Diastolic phases were analyzed on a dedicated work station using MPR, MIP and VRT modes. The patient received 75 cc of contrast. FINDINGS: Aorta:  Normal size.  No calcifications.  No dissection. Aortic Valve:  Trileaflet.  No calcifications. Coronary Arteries:  Normal coronary origin.  Right dominance. RCA is a dominant artery that gives rise to PDA and PLA. There is no disease. Left main is a large artery that gives rise to LAD and LCX arteries. No LM disease is noted LAD has no plaque. LCX is a non-dominant artery that gives rise to two obtuse marginal branches. There is no plaque. Other findings: Normal pulmonary vein drainage into the left atrium. Normal left atrial appendage without a thrombus. Normal size of the pulmonary artery. IMPRESSION: 1. Normal coronary calcium score of 0. Patient is low risk for coronary events. 2. Normal coronary origin with right dominance. 3. No evidence of CAD. 4. CAD-RADS 0.  Consider non-atherosclerotic causes of chest pain. Electronically Signed: By: Kate Sable M.D. On: 02/05/2021 14:49       Assessment & Plan:   Problem List Items Addressed This Visit     Migraine headache - Primary    On topamax.  Seeing neurology.  Dose just increased. Has imitrex if needed.  Follow.       MVP (mitral valve prolapse)    ECHO with moderate MR.  Saw Dr Fletcher Anon 12/2020.  Calcium score 0.  Continue f/u with cardiology.       Seizure disorder (HCC)    Continue dilantin.  Stable.        Stress    On lexapro.  Overall she feels she is  doing well.  Follow.       Weight loss counseling, encounter for    She has adjusted diet.  Decreased soda intake.  Drinking more water.  Walking.  Frustrated.  Had questions about GLP - 1 agonist.  On topamax.  Dose just increased.  Not diabetic.  BMI 28. Discussed diet and exercise.  Hold on prescription medication.  Refer to [place] weight management program.       Relevant Orders    Amb Ref to Medical Weight Management   Other Visit Diagnoses     Need for immunization against influenza       Relevant Orders   Flu Vaccine QUAD 14moIM (Fluarix, Fluzone & Alfiuria Quad PF) (Completed)        CEinar Pheasant MD

## 2022-02-11 DIAGNOSIS — J301 Allergic rhinitis due to pollen: Secondary | ICD-10-CM | POA: Diagnosis not present

## 2022-02-11 DIAGNOSIS — J3089 Other allergic rhinitis: Secondary | ICD-10-CM | POA: Diagnosis not present

## 2022-02-11 DIAGNOSIS — J3081 Allergic rhinitis due to animal (cat) (dog) hair and dander: Secondary | ICD-10-CM | POA: Diagnosis not present

## 2022-02-12 DIAGNOSIS — Z23 Encounter for immunization: Secondary | ICD-10-CM | POA: Diagnosis not present

## 2022-02-14 ENCOUNTER — Encounter: Payer: Self-pay | Admitting: Internal Medicine

## 2022-02-14 DIAGNOSIS — Z713 Dietary counseling and surveillance: Secondary | ICD-10-CM | POA: Insufficient documentation

## 2022-02-14 NOTE — Assessment & Plan Note (Signed)
Continue dilantin.  Stable.   

## 2022-02-14 NOTE — Assessment & Plan Note (Signed)
ECHO with moderate MR.  Saw Dr Fletcher Anon 12/2020.  Calcium score 0.  Continue f/u with cardiology.

## 2022-02-14 NOTE — Assessment & Plan Note (Signed)
On topamax.  Seeing neurology.  Dose just increased. Has imitrex if needed.  Follow.  

## 2022-02-14 NOTE — Assessment & Plan Note (Signed)
On lexapro.  Overall she feels she is doing well.  Follow.  

## 2022-02-14 NOTE — Assessment & Plan Note (Signed)
She has adjusted diet.  Decreased soda intake.  Drinking more water.  Walking.  Frustrated.  Had questions about GLP - 1 agonist.  On topamax.  Dose just increased.  Not diabetic.  BMI 28. Discussed diet and exercise.  Hold on prescription medication.  Refer to  weight management program.

## 2022-02-19 DIAGNOSIS — J3089 Other allergic rhinitis: Secondary | ICD-10-CM | POA: Diagnosis not present

## 2022-02-19 DIAGNOSIS — J3081 Allergic rhinitis due to animal (cat) (dog) hair and dander: Secondary | ICD-10-CM | POA: Diagnosis not present

## 2022-02-19 DIAGNOSIS — J301 Allergic rhinitis due to pollen: Secondary | ICD-10-CM | POA: Diagnosis not present

## 2022-03-04 DIAGNOSIS — J3089 Other allergic rhinitis: Secondary | ICD-10-CM | POA: Diagnosis not present

## 2022-03-04 DIAGNOSIS — J3081 Allergic rhinitis due to animal (cat) (dog) hair and dander: Secondary | ICD-10-CM | POA: Diagnosis not present

## 2022-03-04 DIAGNOSIS — J301 Allergic rhinitis due to pollen: Secondary | ICD-10-CM | POA: Diagnosis not present

## 2022-03-11 DIAGNOSIS — J3081 Allergic rhinitis due to animal (cat) (dog) hair and dander: Secondary | ICD-10-CM | POA: Diagnosis not present

## 2022-03-11 DIAGNOSIS — J301 Allergic rhinitis due to pollen: Secondary | ICD-10-CM | POA: Diagnosis not present

## 2022-03-11 DIAGNOSIS — J3089 Other allergic rhinitis: Secondary | ICD-10-CM | POA: Diagnosis not present

## 2022-03-18 DIAGNOSIS — J3081 Allergic rhinitis due to animal (cat) (dog) hair and dander: Secondary | ICD-10-CM | POA: Diagnosis not present

## 2022-03-18 DIAGNOSIS — J3089 Other allergic rhinitis: Secondary | ICD-10-CM | POA: Diagnosis not present

## 2022-03-18 DIAGNOSIS — H1045 Other chronic allergic conjunctivitis: Secondary | ICD-10-CM | POA: Diagnosis not present

## 2022-03-18 DIAGNOSIS — J301 Allergic rhinitis due to pollen: Secondary | ICD-10-CM | POA: Diagnosis not present

## 2022-03-24 ENCOUNTER — Encounter (INDEPENDENT_AMBULATORY_CARE_PROVIDER_SITE_OTHER): Payer: Self-pay

## 2022-03-25 DIAGNOSIS — J3081 Allergic rhinitis due to animal (cat) (dog) hair and dander: Secondary | ICD-10-CM | POA: Diagnosis not present

## 2022-03-25 DIAGNOSIS — J3089 Other allergic rhinitis: Secondary | ICD-10-CM | POA: Diagnosis not present

## 2022-03-25 DIAGNOSIS — J301 Allergic rhinitis due to pollen: Secondary | ICD-10-CM | POA: Diagnosis not present

## 2022-04-09 DIAGNOSIS — J3081 Allergic rhinitis due to animal (cat) (dog) hair and dander: Secondary | ICD-10-CM | POA: Diagnosis not present

## 2022-04-09 DIAGNOSIS — J301 Allergic rhinitis due to pollen: Secondary | ICD-10-CM | POA: Diagnosis not present

## 2022-04-09 DIAGNOSIS — J3089 Other allergic rhinitis: Secondary | ICD-10-CM | POA: Diagnosis not present

## 2022-04-16 DIAGNOSIS — J3089 Other allergic rhinitis: Secondary | ICD-10-CM | POA: Diagnosis not present

## 2022-04-16 DIAGNOSIS — J301 Allergic rhinitis due to pollen: Secondary | ICD-10-CM | POA: Diagnosis not present

## 2022-04-16 DIAGNOSIS — J3081 Allergic rhinitis due to animal (cat) (dog) hair and dander: Secondary | ICD-10-CM | POA: Diagnosis not present

## 2022-04-25 ENCOUNTER — Other Ambulatory Visit: Payer: Self-pay | Admitting: Internal Medicine

## 2022-04-26 ENCOUNTER — Encounter: Payer: Self-pay | Admitting: Internal Medicine

## 2022-04-26 ENCOUNTER — Ambulatory Visit: Payer: BC Managed Care – PPO | Admitting: Internal Medicine

## 2022-04-26 VITALS — BP 126/60 | HR 87 | Temp 98.4°F | Ht 66.0 in | Wt 170.6 lb

## 2022-04-26 DIAGNOSIS — G43809 Other migraine, not intractable, without status migrainosus: Secondary | ICD-10-CM | POA: Diagnosis not present

## 2022-04-26 DIAGNOSIS — Z13220 Encounter for screening for lipoid disorders: Secondary | ICD-10-CM

## 2022-04-26 DIAGNOSIS — E538 Deficiency of other specified B group vitamins: Secondary | ICD-10-CM | POA: Diagnosis not present

## 2022-04-26 DIAGNOSIS — F32A Depression, unspecified: Secondary | ICD-10-CM

## 2022-04-26 DIAGNOSIS — E559 Vitamin D deficiency, unspecified: Secondary | ICD-10-CM | POA: Diagnosis not present

## 2022-04-26 DIAGNOSIS — G43719 Chronic migraine without aura, intractable, without status migrainosus: Secondary | ICD-10-CM | POA: Diagnosis not present

## 2022-04-26 DIAGNOSIS — F439 Reaction to severe stress, unspecified: Secondary | ICD-10-CM

## 2022-04-26 DIAGNOSIS — Z9109 Other allergy status, other than to drugs and biological substances: Secondary | ICD-10-CM

## 2022-04-26 DIAGNOSIS — G40109 Localization-related (focal) (partial) symptomatic epilepsy and epileptic syndromes with simple partial seizures, not intractable, without status epilepticus: Secondary | ICD-10-CM | POA: Diagnosis not present

## 2022-04-26 DIAGNOSIS — G40909 Epilepsy, unspecified, not intractable, without status epilepticus: Secondary | ICD-10-CM | POA: Diagnosis not present

## 2022-04-26 DIAGNOSIS — R011 Cardiac murmur, unspecified: Secondary | ICD-10-CM

## 2022-04-26 NOTE — Progress Notes (Signed)
Subjective:    Patient ID: Alyssa Bautista, female    DOB: Oct 05, 1970, 51 y.o.   MRN: [id number]  Patient here for  Chief Complaint  Patient presents with   Medical Management of Chronic Issues    6 month f/u    HPI Here to follow up regarding migraine headaches, allergies and increased stress.  Saw neurology 04/26/22 - recommended continuing topamax, imitrex and phenergan prn.  Also on dilantin for history of seizures.  No recurring seizures.  Overall stable.  Does report some nasal congestion.  No chest pain or sob reported.  No increased cough.  No abdominal pain or bowel change reported.  Eating more junk at work.  Discussed diet and exercise.  Discuss treatment options for weight loss.  No history of diabetes.  BMI 27.  Foot pain - outer edge L>R.    Past Medical History:  Diagnosis Date   Anxiety    Arthritis    Depression    Environmental allergies    Gestational diabetes    History of chicken pox    Migraines    MVP (mitral valve prolapse)    Seizures ([place])    Past Surgical History:  Procedure Laterality Date   ABDOMINAL HYSTERECTOMY  02/2011   abnormal pap (stage III dysplasia).   ovaries not removed.   abscess removal  2007   cervix    DILATION AND CURETTAGE OF UTERUS  2002, 2005   LEEP  2008   WISDOM TOOTH EXTRACTION  2003   Family History  Problem Relation Age of Onset   Hypercholesterolemia Mother    Hypercholesterolemia Father    Diabetes Paternal Grandfather    Prostate cancer Paternal Grandfather    Breast cancer Maternal Aunt        50's   Colon cancer Neg Hx    Social History   Socioeconomic History   Marital status: Divorced    Spouse name: Not on file   Number of children: 2   Years of education: Not on file   Highest education level: Not on file  Occupational History   Not on file  Tobacco Use   Smoking status: Never   Smokeless tobacco: Never  Substance and Sexual Activity   Alcohol use: No    Alcohol/week: 0.0 standard  drinks of alcohol    Comment: rarely   Drug use: No   Sexual activity: Not on file  Other Topics Concern   Not on file  Social History Narrative   Not on file   Social Determinants of Health   Financial Resource Strain: Not on file  Food Insecurity: Not on file  Transportation Needs: Not on file  Physical Activity: Not on file  Stress: Not on file  Social Connections: Not on file     Review of Systems  Constitutional:  Negative for appetite change and unexpected weight change.  HENT:  Negative for sinus pressure.        Nasal congestion.   Respiratory:  Negative for cough, chest tightness and shortness of breath.   Cardiovascular:  Negative for chest pain, palpitations and leg swelling.  Gastrointestinal:  Negative for abdominal pain, diarrhea, nausea and vomiting.  Genitourinary:  Negative for difficulty urinating and dysuria.  Musculoskeletal:  Negative for joint swelling and myalgias.  Skin:  Negative for color change and rash.  Neurological:  Negative for dizziness and headaches.  Psychiatric/Behavioral:  Negative for agitation and dysphoric mood.        Objective:  BP 126/60   Pulse 87   Temp 98.4 F (36.9 C)   Ht '5\' 6"'$  (1.676 m)   Wt 170 lb 9.6 oz (77.4 kg)   LMP 02/24/2011   SpO2 99%   BMI 27.54 kg/m  Wt Readings from Last 3 Encounters:  04/26/22 170 lb 9.6 oz (77.4 kg)  02/09/22 177 lb 12.8 oz (80.6 kg)  10/23/21 173 lb (78.5 kg)    Physical Exam Constitutional:      General: She is not in acute distress.    Appearance: Normal appearance.  HENT:     Head: Normocephalic and atraumatic.     Right Ear: External ear normal.     Left Ear: External ear normal.     Nose: Nose normal. No congestion.     Mouth/Throat:     Pharynx: No oropharyngeal exudate or posterior oropharyngeal erythema.  Neck:     Thyroid: No thyromegaly.  Cardiovascular:     Rate and Rhythm: Normal rate and regular rhythm.  Pulmonary:     Effort: No respiratory distress.      Breath sounds: Normal breath sounds. No wheezing.  Abdominal:     General: Bowel sounds are normal.     Palpations: Abdomen is soft.     Tenderness: There is no abdominal tenderness.  Musculoskeletal:        General: No swelling or tenderness.     Cervical back: Neck supple. No tenderness.  Lymphadenopathy:     Cervical: No cervical adenopathy.  Skin:    Findings: No erythema or rash.  Neurological:     Mental Status: She is alert.  Psychiatric:        Behavior: Behavior normal.      Outpatient Encounter Medications as of 04/26/2022  Medication Sig   acetaminophen (TYLENOL) 500 MG tablet Take 500 mg by mouth as needed.   aspirin 325 MG EC tablet Take 325 mg by mouth every 6 (six) hours as needed for pain.   cyanocobalamin (,VITAMIN B-12,) 1000 MCG/ML injection inject once every 30 days.   levocetirizine (XYZAL) 5 MG tablet TAKE 1 TABLET(5 MG) BY MOUTH EVERY EVENING   LORazepam (ATIVAN) 0.5 MG tablet 1/2 tablet q day prn   phenytoin (DILANTIN) 100 MG ER capsule Take 1 capsule (100 mg total) by mouth 3 (three) times daily.   promethazine (PHENERGAN) 25 MG tablet Take by mouth.   SUMAtriptan (IMITREX) 50 MG tablet Take by mouth.   SYRINGE-NEEDLE, DISP, 3 ML (B-D 3CC LUER-LOK SYR 25GX1") 25G X 1" 3 ML MISC USE AS DIRECTED   topiramate (TOPAMAX) 50 MG tablet Take 0.5 mg by mouth in the morning and at bedtime.   [DISCONTINUED] escitalopram (LEXAPRO) 10 MG tablet TAKE 1 AND 1/2 TABLETS(15 MG) BY MOUTH DAILY   [DISCONTINUED] montelukast (SINGULAIR) 10 MG tablet TAKE 1 TABLET(10 MG) BY MOUTH AT BEDTIME   No facility-administered encounter medications on file as of 04/26/2022.     Lab Results  Component Value Date   WBC 5.5 11/02/2021   HGB 13.5 11/02/2021   HCT 39.8 11/02/2021   PLT 322.0 11/02/2021   GLUCOSE 100 (H) 11/02/2021   CHOL 236 (H) 11/02/2021   TRIG 117.0 11/02/2021   HDL 76.70 11/02/2021   LDLCALC 136 (H) 11/02/2021   ALT 16 11/02/2021   AST 14 11/02/2021    NA 141 11/02/2021   K 4.0 11/02/2021   CL 105 11/02/2021   CREATININE 0.92 11/02/2021   BUN 13 11/02/2021   CO2 29 11/02/2021  TSH 4.59 11/02/2021    CT CORONARY MORPH W/CTA COR W/SCORE W/CA W/CM &/OR WO/CM  Addendum Date: 02/05/2021   ADDENDUM REPORT: 02/05/2021 16:22 EXAM: OVER-READ INTERPRETATION  CT CHEST The following report is an over-read performed by radiologist Dr. Norlene Duel [facility] on 02/05/2021. This over-read does not include interpretation of cardiac or coronary anatomy or pathology. The coronary CTA interpretation by the cardiologist is attached. COMPARISON:  None. FINDINGS: No mediastinal mass or adenopathy. Subsegmental atelectasis or scar noted in the left lower lobe. No pleural effusion, airspace consolidation, or atelectasis. No acute abnormality noted within the limited images through the upper abdomen. No acute or suspicious osseous findings. IMPRESSION: 1. No significant noncardiac supplemental findings. 2. Subsegmental atelectasis or scar noted in the left lower lobe. Electronically Signed   By: Kerby Moors M.D.   On: 02/05/2021 16:22   Result Date: 02/05/2021 CLINICAL DATA:  Chest pain EXAM: Cardiac/Coronary  CTA TECHNIQUE: The patient was scanned on a Siemens Somatom go.Top scanner. : A retrospective scan was triggered in the descending thoracic aorta. Axial non-contrast 3 mm slices were carried out through the heart. The data set was analyzed on a dedicated work station and scored using the [place]. Gantry rotation speed was 330 msecs and collimation was .6 mm. '100mg'$  of metoprolol and 0.8 mg of sl NTG was given. The 3D data set was reconstructed in 5% intervals of the 60-95 % of the R-R cycle. Diastolic phases were analyzed on a dedicated work station using MPR, MIP and VRT modes. The patient received 75 cc of contrast. FINDINGS: Aorta:  Normal size.  No calcifications.  No dissection. Aortic Valve:  Trileaflet.  No calcifications. Coronary  Arteries:  Normal coronary origin.  Right dominance. RCA is a dominant artery that gives rise to PDA and PLA. There is no disease. Left main is a large artery that gives rise to LAD and LCX arteries. No LM disease is noted LAD has no plaque. LCX is a non-dominant artery that gives rise to two obtuse marginal branches. There is no plaque. Other findings: Normal pulmonary vein drainage into the left atrium. Normal left atrial appendage without a thrombus. Normal size of the pulmonary artery. IMPRESSION: 1. Normal coronary calcium score of 0. Patient is low risk for coronary events. 2. Normal coronary origin with right dominance. 3. No evidence of CAD. 4. CAD-RADS 0.  Consider non-atherosclerotic causes of chest pain. Electronically Signed: By: Kate Sable M.D. On: 02/05/2021 14:49       Assessment & Plan:  Depression, unspecified depression type Assessment & Plan: On lexapro.  Stable.   Orders: -     Comprehensive metabolic panel; Future -     CBC with Differential/Platelet; Future -     TSH; Future  Environmental allergies Assessment & Plan: Continue xyzal and singulair.  Steroid nasal spray.    Other migraine without status migrainosus, not intractable Assessment & Plan: On topamax.  Seeing neurology.  Dose just increased. Has imitrex if needed.  Follow.    Seizure disorder [facility]) Assessment & Plan: Continue dilantin.  Stable.    Orders: -     Comprehensive metabolic panel; Future  Stress Assessment & Plan: On lexapro.  Overall she feels she is doing well.  Follow.    Murmur Assessment & Plan: Murmur on exa - appears more prominent.  Have her f/u with Dr Fletcher Anon - question of need for f/u ECHO.    B12 deficiency Assessment & Plan: Has been on B12 injections.  Check B12 level with next labs.    Orders: -     Vitamin B12; Future  Screening cholesterol level -     Lipid panel; Future     Einar Pheasant, MD

## 2022-05-06 ENCOUNTER — Other Ambulatory Visit: Payer: Self-pay

## 2022-05-06 ENCOUNTER — Telehealth: Payer: Self-pay | Admitting: Internal Medicine

## 2022-05-06 DIAGNOSIS — Z13220 Encounter for screening for lipoid disorders: Secondary | ICD-10-CM

## 2022-05-06 DIAGNOSIS — G40909 Epilepsy, unspecified, not intractable, without status epilepticus: Secondary | ICD-10-CM

## 2022-05-06 DIAGNOSIS — E538 Deficiency of other specified B group vitamins: Secondary | ICD-10-CM

## 2022-05-06 NOTE — Telephone Encounter (Signed)
Patient has a lab appt 05/11/2022, there are no orders in.

## 2022-05-06 NOTE — Telephone Encounter (Signed)
placed

## 2022-05-08 ENCOUNTER — Encounter: Payer: Self-pay | Admitting: Internal Medicine

## 2022-05-08 ENCOUNTER — Telehealth: Payer: Self-pay | Admitting: Internal Medicine

## 2022-05-08 NOTE — Assessment & Plan Note (Signed)
Has been on B12 injections.  Check B12 level with next labs.

## 2022-05-08 NOTE — Assessment & Plan Note (Signed)
Continue dilantin.  Stable.

## 2022-05-08 NOTE — Assessment & Plan Note (Signed)
Murmur on exa - appears more prominent.  Have her f/u with Dr Fletcher Anon - question of need for f/u ECHO.

## 2022-05-08 NOTE — Assessment & Plan Note (Signed)
Continue xyzal and singulair.  Steroid nasal spray.

## 2022-05-08 NOTE — Assessment & Plan Note (Signed)
On topamax.  Seeing neurology.  Dose just increased. Has imitrex if needed.  Follow.

## 2022-05-08 NOTE — Assessment & Plan Note (Signed)
On lexapro.  Stable.  ?

## 2022-05-08 NOTE — Telephone Encounter (Signed)
Has seen Dr Fletcher Anon.  With murmur - sounds to be more prominent.  Does not need new referral.  Needs appt with Dr Fletcher Anon - f/u murmur and question of need for f/u ECHO

## 2022-05-08 NOTE — Assessment & Plan Note (Signed)
On lexapro.  Overall she feels she is doing well.  Follow.

## 2022-05-11 ENCOUNTER — Other Ambulatory Visit: Payer: BC Managed Care – PPO

## 2022-05-11 NOTE — Telephone Encounter (Signed)
Sent mychart message

## 2022-05-13 DIAGNOSIS — J3089 Other allergic rhinitis: Secondary | ICD-10-CM | POA: Diagnosis not present

## 2022-05-13 DIAGNOSIS — J3081 Allergic rhinitis due to animal (cat) (dog) hair and dander: Secondary | ICD-10-CM | POA: Diagnosis not present

## 2022-05-13 DIAGNOSIS — J301 Allergic rhinitis due to pollen: Secondary | ICD-10-CM | POA: Diagnosis not present

## 2022-05-28 DIAGNOSIS — J3081 Allergic rhinitis due to animal (cat) (dog) hair and dander: Secondary | ICD-10-CM | POA: Diagnosis not present

## 2022-05-28 DIAGNOSIS — J301 Allergic rhinitis due to pollen: Secondary | ICD-10-CM | POA: Diagnosis not present

## 2022-05-28 DIAGNOSIS — J3089 Other allergic rhinitis: Secondary | ICD-10-CM | POA: Diagnosis not present

## 2022-06-02 DIAGNOSIS — J301 Allergic rhinitis due to pollen: Secondary | ICD-10-CM | POA: Diagnosis not present

## 2022-06-03 DIAGNOSIS — J3089 Other allergic rhinitis: Secondary | ICD-10-CM | POA: Diagnosis not present

## 2022-06-03 DIAGNOSIS — J301 Allergic rhinitis due to pollen: Secondary | ICD-10-CM | POA: Diagnosis not present

## 2022-06-03 DIAGNOSIS — J3081 Allergic rhinitis due to animal (cat) (dog) hair and dander: Secondary | ICD-10-CM | POA: Diagnosis not present

## 2022-06-14 DIAGNOSIS — Z1231 Encounter for screening mammogram for malignant neoplasm of breast: Secondary | ICD-10-CM | POA: Diagnosis not present

## 2022-06-14 LAB — HM MAMMOGRAPHY

## 2022-06-24 DIAGNOSIS — J3081 Allergic rhinitis due to animal (cat) (dog) hair and dander: Secondary | ICD-10-CM | POA: Diagnosis not present

## 2022-06-24 DIAGNOSIS — J301 Allergic rhinitis due to pollen: Secondary | ICD-10-CM | POA: Diagnosis not present

## 2022-06-24 DIAGNOSIS — J3089 Other allergic rhinitis: Secondary | ICD-10-CM | POA: Diagnosis not present

## 2022-07-01 DIAGNOSIS — J301 Allergic rhinitis due to pollen: Secondary | ICD-10-CM | POA: Diagnosis not present

## 2022-07-01 DIAGNOSIS — J3089 Other allergic rhinitis: Secondary | ICD-10-CM | POA: Diagnosis not present

## 2022-07-01 DIAGNOSIS — J3081 Allergic rhinitis due to animal (cat) (dog) hair and dander: Secondary | ICD-10-CM | POA: Diagnosis not present

## 2022-07-08 DIAGNOSIS — J301 Allergic rhinitis due to pollen: Secondary | ICD-10-CM | POA: Diagnosis not present

## 2022-07-08 DIAGNOSIS — J3081 Allergic rhinitis due to animal (cat) (dog) hair and dander: Secondary | ICD-10-CM | POA: Diagnosis not present

## 2022-07-08 DIAGNOSIS — J3089 Other allergic rhinitis: Secondary | ICD-10-CM | POA: Diagnosis not present

## 2022-07-22 DIAGNOSIS — J301 Allergic rhinitis due to pollen: Secondary | ICD-10-CM | POA: Diagnosis not present

## 2022-07-22 DIAGNOSIS — J3089 Other allergic rhinitis: Secondary | ICD-10-CM | POA: Diagnosis not present

## 2022-07-22 DIAGNOSIS — J3081 Allergic rhinitis due to animal (cat) (dog) hair and dander: Secondary | ICD-10-CM | POA: Diagnosis not present

## 2022-07-27 ENCOUNTER — Ambulatory Visit (INDEPENDENT_AMBULATORY_CARE_PROVIDER_SITE_OTHER): Payer: BC Managed Care – PPO | Admitting: Internal Medicine

## 2022-07-27 DIAGNOSIS — G40909 Epilepsy, unspecified, not intractable, without status epilepticus: Secondary | ICD-10-CM

## 2022-08-01 ENCOUNTER — Encounter: Payer: Self-pay | Admitting: Internal Medicine

## 2022-08-01 NOTE — Progress Notes (Signed)
Patient ID: Alyssa Bautista, female   DOB: 04-12-1971, 52 y.o.   MRN: [id number] Pt was not seen.  Called to change appt.

## 2022-08-03 ENCOUNTER — Ambulatory Visit: Payer: BC Managed Care – PPO | Admitting: Internal Medicine

## 2022-08-03 NOTE — Progress Notes (Deleted)
Subjective:    Patient ID: Alyssa Bautista, female    DOB: [birth year], 52 y.o.   MRN: [id number]  Patient here for No chief complaint on file.   HPI Here to follow up regarding migraine headaches, allergies and increased stress. Saw neurology 04/26/22 - recommended continuing topamax, imitrex and phenergan prn. Also on dilantin for history of seizures. No recurring seizures.    Past Medical History:  Diagnosis Date   Anxiety    Arthritis    Depression    Environmental allergies    Gestational diabetes    History of chicken pox    Migraines    MVP (mitral valve prolapse)    Seizures ([place])    Past Surgical History:  Procedure Laterality Date   ABDOMINAL HYSTERECTOMY  02/2011   abnormal pap (stage III dysplasia).   ovaries not removed.   abscess removal  2007   cervix    DILATION AND CURETTAGE OF UTERUS  2002, 2005   LEEP  2008   WISDOM TOOTH EXTRACTION  2003   Family History  Problem Relation Age of Onset   Hypercholesterolemia Mother    Hypercholesterolemia Father    Diabetes Paternal Grandfather    Prostate cancer Paternal Grandfather    Breast cancer Maternal Aunt        50's   Colon cancer Neg Hx    Social History   Socioeconomic History   Marital status: Divorced    Spouse name: Not on file   Number of children: 2   Years of education: Not on file   Highest education level: Not on file  Occupational History   Not on file  Tobacco Use   Smoking status: Never   Smokeless tobacco: Never  Substance and Sexual Activity   Alcohol use: No    Alcohol/week: 0.0 standard drinks of alcohol    Comment: rarely   Drug use: No   Sexual activity: Not on file  Other Topics Concern   Not on file  Social History Narrative   Not on file   Social Determinants of Health   Financial Resource Strain: Not on file  Food Insecurity: Not on file  Transportation Needs: Not on file  Physical Activity: Not on file  Stress: Not on file  Social Connections: Not on  file     Review of Systems     Objective:     LMP 02/24/2011  Wt Readings from Last 3 Encounters:  04/26/22 170 lb 9.6 oz (77.4 kg)  02/09/22 177 lb 12.8 oz (80.6 kg)  10/23/21 173 lb (78.5 kg)    Physical Exam   Outpatient Encounter Medications as of 08/03/2022  Medication Sig   acetaminophen (TYLENOL) 500 MG tablet Take 500 mg by mouth as needed.   aspirin 325 MG EC tablet Take 325 mg by mouth every 6 (six) hours as needed for pain.   cyanocobalamin (,VITAMIN B-12,) 1000 MCG/ML injection inject once every 30 days.   escitalopram (LEXAPRO) 10 MG tablet TAKE 1 AND 1/2 TABLETS(15 MG) BY MOUTH DAILY   levocetirizine (XYZAL) 5 MG tablet TAKE 1 TABLET(5 MG) BY MOUTH EVERY EVENING   LORazepam (ATIVAN) 0.5 MG tablet 1/2 tablet q day prn   montelukast (SINGULAIR) 10 MG tablet TAKE 1 TABLET(10 MG) BY MOUTH AT BEDTIME   phenytoin (DILANTIN) 100 MG ER capsule Take 1 capsule (100 mg total) by mouth 3 (three) times daily.   promethazine (PHENERGAN) 25 MG tablet Take by mouth.   SUMAtriptan (IMITREX) 50 MG tablet  Take by mouth.   SYRINGE-NEEDLE, DISP, 3 ML (B-D 3CC LUER-LOK SYR 25GX1") 25G X 1" 3 ML MISC USE AS DIRECTED   topiramate (TOPAMAX) 50 MG tablet Take 0.5 mg by mouth in the morning and at bedtime.   No facility-administered encounter medications on file as of 08/03/2022.     Lab Results  Component Value Date   WBC 5.5 11/02/2021   HGB 13.5 11/02/2021   HCT 39.8 11/02/2021   PLT 322.0 11/02/2021   GLUCOSE 100 (H) 11/02/2021   CHOL 236 (H) 11/02/2021   TRIG 117.0 11/02/2021   HDL 76.70 11/02/2021   LDLCALC 136 (H) 11/02/2021   ALT 16 11/02/2021   AST 14 11/02/2021   NA 141 11/02/2021   K 4.0 11/02/2021   CL 105 11/02/2021   CREATININE 0.92 11/02/2021   BUN 13 11/02/2021   CO2 29 11/02/2021   TSH 4.59 11/02/2021    CT CORONARY MORPH W/CTA COR W/SCORE W/CA W/CM &/OR WO/CM  Addendum Date: 02/05/2021   ADDENDUM REPORT: 02/05/2021 16:22 EXAM: OVER-READ INTERPRETATION   CT CHEST The following report is an over-read performed by radiologist Dr. Norlene Duel [facility] on 02/05/2021. This over-read does not include interpretation of cardiac or coronary anatomy or pathology. The coronary CTA interpretation by the cardiologist is attached. COMPARISON:  None. FINDINGS: No mediastinal mass or adenopathy. Subsegmental atelectasis or scar noted in the left lower lobe. No pleural effusion, airspace consolidation, or atelectasis. No acute abnormality noted within the limited images through the upper abdomen. No acute or suspicious osseous findings. IMPRESSION: 1. No significant noncardiac supplemental findings. 2. Subsegmental atelectasis or scar noted in the left lower lobe. Electronically Signed   By: Kerby Moors M.D.   On: 02/05/2021 16:22   Result Date: 02/05/2021 CLINICAL DATA:  Chest pain EXAM: Cardiac/Coronary  CTA TECHNIQUE: The patient was scanned on a Siemens Somatom go.Top scanner. : A retrospective scan was triggered in the descending thoracic aorta. Axial non-contrast 3 mm slices were carried out through the heart. The data set was analyzed on a dedicated work station and scored using the [place]. Gantry rotation speed was 330 msecs and collimation was .6 mm. 100mg  of metoprolol and 0.8 mg of sl NTG was given. The 3D data set was reconstructed in 5% intervals of the 60-95 % of the R-R cycle. Diastolic phases were analyzed on a dedicated work station using MPR, MIP and VRT modes. The patient received 75 cc of contrast. FINDINGS: Aorta:  Normal size.  No calcifications.  No dissection. Aortic Valve:  Trileaflet.  No calcifications. Coronary Arteries:  Normal coronary origin.  Right dominance. RCA is a dominant artery that gives rise to PDA and PLA. There is no disease. Left main is a large artery that gives rise to LAD and LCX arteries. No LM disease is noted LAD has no plaque. LCX is a non-dominant artery that gives rise to two obtuse marginal  branches. There is no plaque. Other findings: Normal pulmonary vein drainage into the left atrium. Normal left atrial appendage without a thrombus. Normal size of the pulmonary artery. IMPRESSION: 1. Normal coronary calcium score of 0. Patient is low risk for coronary events. 2. Normal coronary origin with right dominance. 3. No evidence of CAD. 4. CAD-RADS 0.  Consider non-atherosclerotic causes of chest pain. Electronically Signed: By: Kate Sable M.D. On: 02/05/2021 14:49       Assessment & Plan:  There are no diagnoses linked to this encounter.   Einar Pheasant,  MD

## 2022-08-20 DIAGNOSIS — J301 Allergic rhinitis due to pollen: Secondary | ICD-10-CM | POA: Diagnosis not present

## 2022-08-20 DIAGNOSIS — J3089 Other allergic rhinitis: Secondary | ICD-10-CM | POA: Diagnosis not present

## 2022-08-20 DIAGNOSIS — J3081 Allergic rhinitis due to animal (cat) (dog) hair and dander: Secondary | ICD-10-CM | POA: Diagnosis not present

## 2022-09-03 DIAGNOSIS — J3081 Allergic rhinitis due to animal (cat) (dog) hair and dander: Secondary | ICD-10-CM | POA: Diagnosis not present

## 2022-09-03 DIAGNOSIS — J301 Allergic rhinitis due to pollen: Secondary | ICD-10-CM | POA: Diagnosis not present

## 2022-09-03 DIAGNOSIS — J3089 Other allergic rhinitis: Secondary | ICD-10-CM | POA: Diagnosis not present

## 2022-09-16 DIAGNOSIS — J3089 Other allergic rhinitis: Secondary | ICD-10-CM | POA: Diagnosis not present

## 2022-09-16 DIAGNOSIS — H1045 Other chronic allergic conjunctivitis: Secondary | ICD-10-CM | POA: Diagnosis not present

## 2022-09-16 DIAGNOSIS — J3081 Allergic rhinitis due to animal (cat) (dog) hair and dander: Secondary | ICD-10-CM | POA: Diagnosis not present

## 2022-09-16 DIAGNOSIS — J301 Allergic rhinitis due to pollen: Secondary | ICD-10-CM | POA: Diagnosis not present

## 2022-10-01 DIAGNOSIS — J3081 Allergic rhinitis due to animal (cat) (dog) hair and dander: Secondary | ICD-10-CM | POA: Diagnosis not present

## 2022-10-01 DIAGNOSIS — J301 Allergic rhinitis due to pollen: Secondary | ICD-10-CM | POA: Diagnosis not present

## 2022-10-01 DIAGNOSIS — J3089 Other allergic rhinitis: Secondary | ICD-10-CM | POA: Diagnosis not present

## 2022-10-29 DIAGNOSIS — J3081 Allergic rhinitis due to animal (cat) (dog) hair and dander: Secondary | ICD-10-CM | POA: Diagnosis not present

## 2022-10-29 DIAGNOSIS — J3089 Other allergic rhinitis: Secondary | ICD-10-CM | POA: Diagnosis not present

## 2022-10-29 DIAGNOSIS — J301 Allergic rhinitis due to pollen: Secondary | ICD-10-CM | POA: Diagnosis not present

## 2022-11-05 DIAGNOSIS — J3081 Allergic rhinitis due to animal (cat) (dog) hair and dander: Secondary | ICD-10-CM | POA: Diagnosis not present

## 2022-11-05 DIAGNOSIS — J301 Allergic rhinitis due to pollen: Secondary | ICD-10-CM | POA: Diagnosis not present

## 2022-11-05 DIAGNOSIS — J3089 Other allergic rhinitis: Secondary | ICD-10-CM | POA: Diagnosis not present

## 2022-11-23 DIAGNOSIS — J301 Allergic rhinitis due to pollen: Secondary | ICD-10-CM | POA: Diagnosis not present

## 2022-11-23 DIAGNOSIS — J3089 Other allergic rhinitis: Secondary | ICD-10-CM | POA: Diagnosis not present

## 2022-11-23 DIAGNOSIS — J3081 Allergic rhinitis due to animal (cat) (dog) hair and dander: Secondary | ICD-10-CM | POA: Diagnosis not present

## 2022-11-29 ENCOUNTER — Other Ambulatory Visit: Payer: Self-pay | Admitting: Internal Medicine

## 2022-12-03 DIAGNOSIS — J301 Allergic rhinitis due to pollen: Secondary | ICD-10-CM | POA: Diagnosis not present

## 2022-12-03 DIAGNOSIS — J3089 Other allergic rhinitis: Secondary | ICD-10-CM | POA: Diagnosis not present

## 2022-12-03 DIAGNOSIS — J3081 Allergic rhinitis due to animal (cat) (dog) hair and dander: Secondary | ICD-10-CM | POA: Diagnosis not present

## 2022-12-10 DIAGNOSIS — J301 Allergic rhinitis due to pollen: Secondary | ICD-10-CM | POA: Diagnosis not present

## 2022-12-10 DIAGNOSIS — J3089 Other allergic rhinitis: Secondary | ICD-10-CM | POA: Diagnosis not present

## 2022-12-10 DIAGNOSIS — J3081 Allergic rhinitis due to animal (cat) (dog) hair and dander: Secondary | ICD-10-CM | POA: Diagnosis not present

## 2022-12-17 DIAGNOSIS — J3081 Allergic rhinitis due to animal (cat) (dog) hair and dander: Secondary | ICD-10-CM | POA: Diagnosis not present

## 2022-12-17 DIAGNOSIS — J301 Allergic rhinitis due to pollen: Secondary | ICD-10-CM | POA: Diagnosis not present

## 2022-12-17 DIAGNOSIS — J3089 Other allergic rhinitis: Secondary | ICD-10-CM | POA: Diagnosis not present

## 2022-12-21 ENCOUNTER — Encounter: Payer: Self-pay | Admitting: Internal Medicine

## 2022-12-31 DIAGNOSIS — J3081 Allergic rhinitis due to animal (cat) (dog) hair and dander: Secondary | ICD-10-CM | POA: Diagnosis not present

## 2022-12-31 DIAGNOSIS — J3089 Other allergic rhinitis: Secondary | ICD-10-CM | POA: Diagnosis not present

## 2022-12-31 DIAGNOSIS — J301 Allergic rhinitis due to pollen: Secondary | ICD-10-CM | POA: Diagnosis not present

## 2023-01-07 DIAGNOSIS — J3089 Other allergic rhinitis: Secondary | ICD-10-CM | POA: Diagnosis not present

## 2023-01-07 DIAGNOSIS — J3081 Allergic rhinitis due to animal (cat) (dog) hair and dander: Secondary | ICD-10-CM | POA: Diagnosis not present

## 2023-01-07 DIAGNOSIS — J301 Allergic rhinitis due to pollen: Secondary | ICD-10-CM | POA: Diagnosis not present

## 2023-01-14 DIAGNOSIS — J301 Allergic rhinitis due to pollen: Secondary | ICD-10-CM | POA: Diagnosis not present

## 2023-01-14 DIAGNOSIS — J3081 Allergic rhinitis due to animal (cat) (dog) hair and dander: Secondary | ICD-10-CM | POA: Diagnosis not present

## 2023-01-14 DIAGNOSIS — J3089 Other allergic rhinitis: Secondary | ICD-10-CM | POA: Diagnosis not present

## 2023-01-20 DIAGNOSIS — J3089 Other allergic rhinitis: Secondary | ICD-10-CM | POA: Diagnosis not present

## 2023-01-20 DIAGNOSIS — J301 Allergic rhinitis due to pollen: Secondary | ICD-10-CM | POA: Diagnosis not present

## 2023-01-20 DIAGNOSIS — J3081 Allergic rhinitis due to animal (cat) (dog) hair and dander: Secondary | ICD-10-CM | POA: Diagnosis not present

## 2023-01-28 DIAGNOSIS — J3089 Other allergic rhinitis: Secondary | ICD-10-CM | POA: Diagnosis not present

## 2023-01-28 DIAGNOSIS — J3081 Allergic rhinitis due to animal (cat) (dog) hair and dander: Secondary | ICD-10-CM | POA: Diagnosis not present

## 2023-01-28 DIAGNOSIS — J301 Allergic rhinitis due to pollen: Secondary | ICD-10-CM | POA: Diagnosis not present

## 2023-02-03 DIAGNOSIS — Z23 Encounter for immunization: Secondary | ICD-10-CM | POA: Diagnosis not present

## 2023-02-10 ENCOUNTER — Ambulatory Visit: Payer: BC Managed Care – PPO | Admitting: Internal Medicine

## 2023-02-10 VITALS — BP 108/70 | HR 81 | Temp 97.9°F | Resp 16 | Ht 66.0 in | Wt 172.0 lb

## 2023-02-10 DIAGNOSIS — Z9109 Other allergy status, other than to drugs and biological substances: Secondary | ICD-10-CM

## 2023-02-10 DIAGNOSIS — R300 Dysuria: Secondary | ICD-10-CM

## 2023-02-10 DIAGNOSIS — G43809 Other migraine, not intractable, without status migrainosus: Secondary | ICD-10-CM

## 2023-02-10 DIAGNOSIS — R011 Cardiac murmur, unspecified: Secondary | ICD-10-CM

## 2023-02-10 DIAGNOSIS — G40909 Epilepsy, unspecified, not intractable, without status epilepticus: Secondary | ICD-10-CM | POA: Diagnosis not present

## 2023-02-10 DIAGNOSIS — E538 Deficiency of other specified B group vitamins: Secondary | ICD-10-CM | POA: Diagnosis not present

## 2023-02-10 DIAGNOSIS — Z13220 Encounter for screening for lipoid disorders: Secondary | ICD-10-CM

## 2023-02-10 DIAGNOSIS — F32A Depression, unspecified: Secondary | ICD-10-CM

## 2023-02-10 DIAGNOSIS — F439 Reaction to severe stress, unspecified: Secondary | ICD-10-CM

## 2023-02-10 LAB — CBC WITH DIFFERENTIAL/PLATELET
Basophils Absolute: 0 10*3/uL (ref 0.0–0.1)
Basophils Relative: 0.4 % (ref 0.0–3.0)
Eosinophils Absolute: 0.1 10*3/uL (ref 0.0–0.7)
Eosinophils Relative: 2 % (ref 0.0–5.0)
HCT: 40.8 % (ref 36.0–46.0)
Hemoglobin: 13.4 g/dL (ref 12.0–15.0)
Lymphocytes Relative: 33.6 % (ref 12.0–46.0)
Lymphs Abs: 2.1 10*3/uL (ref 0.7–4.0)
MCHC: 33 g/dL (ref 30.0–36.0)
MCV: 100.2 fL — ABNORMAL HIGH (ref 78.0–100.0)
Monocytes Absolute: 0.5 10*3/uL (ref 0.1–1.0)
Monocytes Relative: 7.3 % (ref 3.0–12.0)
Neutro Abs: 3.6 10*3/uL (ref 1.4–7.7)
Neutrophils Relative %: 56.7 % (ref 43.0–77.0)
Platelets: 332 10*3/uL (ref 150.0–400.0)
RBC: 4.07 Mil/uL (ref 3.87–5.11)
RDW: 13.5 % (ref 11.5–15.5)
WBC: 6.3 10*3/uL (ref 4.0–10.5)

## 2023-02-10 LAB — URINALYSIS, ROUTINE W REFLEX MICROSCOPIC
Bilirubin Urine: NEGATIVE
Hgb urine dipstick: NEGATIVE
Ketones, ur: NEGATIVE
Leukocytes,Ua: NEGATIVE
Nitrite: NEGATIVE
Specific Gravity, Urine: 1.025 (ref 1.000–1.030)
Total Protein, Urine: NEGATIVE
Urine Glucose: NEGATIVE
Urobilinogen, UA: 0.2 (ref 0.0–1.0)
pH: 6 (ref 5.0–8.0)

## 2023-02-10 LAB — COMPREHENSIVE METABOLIC PANEL
ALT: 17 U/L (ref 0–35)
AST: 14 U/L (ref 0–37)
Albumin: 4.4 g/dL (ref 3.5–5.2)
Alkaline Phosphatase: 100 U/L (ref 39–117)
BUN: 11 mg/dL (ref 6–23)
CO2: 28 meq/L (ref 19–32)
Calcium: 8.9 mg/dL (ref 8.4–10.5)
Chloride: 106 meq/L (ref 96–112)
Creatinine, Ser: 0.83 mg/dL (ref 0.40–1.20)
GFR: 82.43 mL/min (ref >60.00)
Glucose, Bld: 57 mg/dL — ABNORMAL LOW (ref 70–99)
Potassium: 4.2 meq/L (ref 3.5–5.1)
Sodium: 140 meq/L (ref 135–145)
Total Bilirubin: 0.3 mg/dL (ref 0.2–1.2)
Total Protein: 7.1 g/dL (ref 6.0–8.3)

## 2023-02-10 LAB — LIPID PANEL
Cholesterol: 222 mg/dL — ABNORMAL HIGH (ref 0–200)
HDL: 74.6 mg/dL (ref >39.00)
LDL Cholesterol: 111 mg/dL — ABNORMAL HIGH (ref 0–99)
NonHDL: 147.07
Total CHOL/HDL Ratio: 3
Triglycerides: 179 mg/dL — ABNORMAL HIGH (ref 0.0–149.0)
VLDL: 35.8 mg/dL (ref 0.0–40.0)

## 2023-02-10 LAB — TSH: TSH: 3.09 u[IU]/mL (ref 0.35–5.50)

## 2023-02-10 LAB — VITAMIN B12: Vitamin B-12: 178 pg/mL — ABNORMAL LOW (ref 211–911)

## 2023-02-10 MED ORDER — MONTELUKAST SODIUM 10 MG PO TABS: ORAL_TABLET | ORAL | 3 refills | Status: DC

## 2023-02-10 MED ORDER — CYANOCOBALAMIN 1000 MCG/ML IJ SOLN: INTRAMUSCULAR | 0 refills | Status: DC

## 2023-02-10 MED ORDER — "BD LUER-LOK SYRINGE 25G X 1"" 3 ML MISC": 0 refills | Status: AC

## 2023-02-10 MED ORDER — LORAZEPAM 0.5 MG PO TABS: ORAL_TABLET | ORAL | 0 refills | Status: AC
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
## 2023-02-11 ENCOUNTER — Telehealth: Payer: Self-pay

## 2023-02-11 DIAGNOSIS — J3081 Allergic rhinitis due to animal (cat) (dog) hair and dander: Secondary | ICD-10-CM | POA: Diagnosis not present

## 2023-02-11 DIAGNOSIS — J3089 Other allergic rhinitis: Secondary | ICD-10-CM | POA: Diagnosis not present

## 2023-02-11 DIAGNOSIS — J301 Allergic rhinitis due to pollen: Secondary | ICD-10-CM | POA: Diagnosis not present

## 2023-02-11 NOTE — Telephone Encounter (Signed)
-----   Message from [employer] sent at 02/10/2023  9:08 PM EDT ----- Notify B12 level is low.  In reviewing, it appears that we sent in rx for B12.  Given B12 level, she needs to start B12 injections - B12 q week x 4 weeks and then q month. Hgb, kidney function tests and liver function tests are wnl. Triglycerides increased some, remainder of cholesterol wnl.  Recommend low carb diet and exercise.  We will follow. Urinalysis ok.  Thyroid test wnl.

## 2023-02-13 ENCOUNTER — Encounter: Payer: Self-pay | Admitting: Internal Medicine

## 2023-02-13 NOTE — Assessment & Plan Note (Signed)
Check urine to confirm no infection.  

## 2023-02-13 NOTE — Assessment & Plan Note (Signed)
On topamax.  Has seen neurology.  Overall stable.

## 2023-02-13 NOTE — Assessment & Plan Note (Signed)
Murmur on exam - appears more prominent.  Have her f/u with Dr Kirke Corin - question of need for f/u ECHO.

## 2023-02-13 NOTE — Assessment & Plan Note (Signed)
Continue xyzal and singulair.  Steroid nasal spray.

## 2023-02-13 NOTE — Assessment & Plan Note (Signed)
On lexapro.  Increased stress as outlined.  Discussed.  Will notify me if feels needs further intervention.

## 2023-02-13 NOTE — Assessment & Plan Note (Addendum)
On lexapro.  Increased stress as outlined.  Discussed.  Has good support.  Will notify me if feels needs any further intervention.  Follow.

## 2023-02-13 NOTE — Assessment & Plan Note (Signed)
Continue dilantin.  Stable.

## 2023-02-13 NOTE — Assessment & Plan Note (Signed)
Check B12 level. 

## 2023-02-14 NOTE — Telephone Encounter (Signed)
Patient states she is returning call from Rita Ohara, LPN.  I read message from Dr. Dale [place] to patient.  Patient states she will be giving herself the B12 injections at home.

## 2023-02-15 MED ORDER — CYANOCOBALAMIN 1000 MCG/ML IJ SOLN: INTRAMUSCULAR | 0 refills | Status: DC

## 2023-02-15 NOTE — Telephone Encounter (Signed)
Spoke with pt to verify that she was aware to give herself an inject once a week for 4 weeks before starting the monthly injections. Pt stated that the rx was sent in for once monthly and she was afraid that insurance wouldn't cover a refill for the weekly ones if the directions stated monthly. I have sent in a new rx with directions stating once a week for 4 weeks.

## 2023-02-15 NOTE — Addendum Note (Signed)
Addended by: Sandy Salaam on: 02/15/2023 11:37 AM   Modules accepted: Orders

## 2023-02-18 DIAGNOSIS — J301 Allergic rhinitis due to pollen: Secondary | ICD-10-CM | POA: Diagnosis not present

## 2023-02-18 DIAGNOSIS — J3081 Allergic rhinitis due to animal (cat) (dog) hair and dander: Secondary | ICD-10-CM | POA: Diagnosis not present

## 2023-02-18 DIAGNOSIS — J3089 Other allergic rhinitis: Secondary | ICD-10-CM | POA: Diagnosis not present

## 2023-02-25 DIAGNOSIS — J3081 Allergic rhinitis due to animal (cat) (dog) hair and dander: Secondary | ICD-10-CM | POA: Diagnosis not present

## 2023-02-25 DIAGNOSIS — J301 Allergic rhinitis due to pollen: Secondary | ICD-10-CM | POA: Diagnosis not present

## 2023-02-25 DIAGNOSIS — J3089 Other allergic rhinitis: Secondary | ICD-10-CM | POA: Diagnosis not present

## 2023-03-04 DIAGNOSIS — J301 Allergic rhinitis due to pollen: Secondary | ICD-10-CM | POA: Diagnosis not present

## 2023-03-04 DIAGNOSIS — J3081 Allergic rhinitis due to animal (cat) (dog) hair and dander: Secondary | ICD-10-CM | POA: Diagnosis not present

## 2023-03-04 DIAGNOSIS — J3089 Other allergic rhinitis: Secondary | ICD-10-CM | POA: Diagnosis not present

## 2023-03-11 DIAGNOSIS — J3089 Other allergic rhinitis: Secondary | ICD-10-CM | POA: Diagnosis not present

## 2023-03-11 DIAGNOSIS — J3081 Allergic rhinitis due to animal (cat) (dog) hair and dander: Secondary | ICD-10-CM | POA: Diagnosis not present

## 2023-03-11 DIAGNOSIS — J301 Allergic rhinitis due to pollen: Secondary | ICD-10-CM | POA: Diagnosis not present

## 2023-03-25 DIAGNOSIS — J3081 Allergic rhinitis due to animal (cat) (dog) hair and dander: Secondary | ICD-10-CM | POA: Diagnosis not present

## 2023-03-25 DIAGNOSIS — J301 Allergic rhinitis due to pollen: Secondary | ICD-10-CM | POA: Diagnosis not present

## 2023-03-25 DIAGNOSIS — J3089 Other allergic rhinitis: Secondary | ICD-10-CM | POA: Diagnosis not present

## 2023-04-09 ENCOUNTER — Other Ambulatory Visit: Payer: Self-pay | Admitting: Internal Medicine

## 2023-04-15 DIAGNOSIS — J3081 Allergic rhinitis due to animal (cat) (dog) hair and dander: Secondary | ICD-10-CM | POA: Diagnosis not present

## 2023-04-15 DIAGNOSIS — J3089 Other allergic rhinitis: Secondary | ICD-10-CM | POA: Diagnosis not present

## 2023-04-15 DIAGNOSIS — J301 Allergic rhinitis due to pollen: Secondary | ICD-10-CM | POA: Diagnosis not present

## 2023-04-28 DIAGNOSIS — J301 Allergic rhinitis due to pollen: Secondary | ICD-10-CM | POA: Diagnosis not present

## 2023-04-28 DIAGNOSIS — J3089 Other allergic rhinitis: Secondary | ICD-10-CM | POA: Diagnosis not present

## 2023-04-28 DIAGNOSIS — J3081 Allergic rhinitis due to animal (cat) (dog) hair and dander: Secondary | ICD-10-CM | POA: Diagnosis not present

## 2023-05-26 ENCOUNTER — Ambulatory Visit (INDEPENDENT_AMBULATORY_CARE_PROVIDER_SITE_OTHER): Payer: BC Managed Care – PPO | Admitting: Internal Medicine

## 2023-05-26 ENCOUNTER — Other Ambulatory Visit (HOSPITAL_COMMUNITY)
Admission: RE | Admit: 2023-05-26 | Discharge: 2023-05-26 | Disposition: A | Discharge status: Home / Self Care | Payer: BC Managed Care – PPO | Source: Ambulatory Visit | Attending: Internal Medicine | Admitting: Internal Medicine

## 2023-05-26 VITALS — BP 128/74 | HR 89 | Temp 98.2°F | Resp 16 | Ht 66.0 in | Wt 173.0 lb

## 2023-05-26 DIAGNOSIS — E538 Deficiency of other specified B group vitamins: Secondary | ICD-10-CM

## 2023-05-26 DIAGNOSIS — Z0000 Encounter for general adult medical examination without abnormal findings: Secondary | ICD-10-CM | POA: Diagnosis not present

## 2023-05-26 DIAGNOSIS — E2839 Other primary ovarian failure: Secondary | ICD-10-CM

## 2023-05-26 DIAGNOSIS — F439 Reaction to severe stress, unspecified: Secondary | ICD-10-CM

## 2023-05-26 DIAGNOSIS — J3089 Other allergic rhinitis: Secondary | ICD-10-CM | POA: Diagnosis not present

## 2023-05-26 DIAGNOSIS — J301 Allergic rhinitis due to pollen: Secondary | ICD-10-CM | POA: Diagnosis not present

## 2023-05-26 DIAGNOSIS — G40909 Epilepsy, unspecified, not intractable, without status epilepticus: Secondary | ICD-10-CM | POA: Diagnosis not present

## 2023-05-26 DIAGNOSIS — G43809 Other migraine, not intractable, without status migrainosus: Secondary | ICD-10-CM

## 2023-05-26 DIAGNOSIS — Z124 Encounter for screening for malignant neoplasm of cervix: Secondary | ICD-10-CM | POA: Insufficient documentation

## 2023-05-26 DIAGNOSIS — J3081 Allergic rhinitis due to animal (cat) (dog) hair and dander: Secondary | ICD-10-CM | POA: Diagnosis not present

## 2023-05-26 DIAGNOSIS — F32A Depression, unspecified: Secondary | ICD-10-CM

## 2023-05-26 DIAGNOSIS — R011 Cardiac murmur, unspecified: Secondary | ICD-10-CM

## 2023-05-26 NOTE — Progress Notes (Signed)
Subjective:    Patient ID: Alyssa Bautista, female    DOB: 05-09-1971, 53 y.o.   MRN: [id number]  Patient here for  Chief Complaint  Patient presents with   Annual Exam    HPI Here for a physical exam. On lexapro. Overall feels she is handling things relatively well.  Mother moved. Some decreased stress related to this. Saw neurology 04/26/22 - recommended continuing topamax, imitrex and phenergan prn. Also on dilantin for history of seizures. No recurring seizures. Continues on xyzal and singulair for allergies. Breathing overall stable. Discussed the need for f/u with Dr Kirke Corin.    Past Medical History:  Diagnosis Date   Anxiety    Arthritis    Depression    Environmental allergies    Gestational diabetes    History of chicken pox    Migraines    MVP (mitral valve prolapse)    Seizures (HCC)    Past Surgical History:  Procedure Laterality Date   ABDOMINAL HYSTERECTOMY  02/2011   abnormal pap (stage III dysplasia).   ovaries not removed.   abscess removal  2007   cervix    DILATION AND CURETTAGE OF UTERUS  2002, 2005   LEEP  2008   WISDOM TOOTH EXTRACTION  2003   Family History  Problem Relation Age of Onset   Hypercholesterolemia Mother    Hypercholesterolemia Father    Diabetes Paternal Grandfather    Prostate cancer Paternal Grandfather    Breast cancer Maternal Aunt        56's   Colon cancer Neg Hx    Social History   Socioeconomic History   Marital status: Divorced    Spouse name: Not on file   Number of children: 2   Years of education: Not on file   Highest education level: Not on file  Occupational History   Not on file  Tobacco Use   Smoking status: Never   Smokeless tobacco: Never  Substance and Sexual Activity   Alcohol use: No    Alcohol/week: 0.0 standard drinks of alcohol    Comment: rarely   Drug use: No   Sexual activity: Not on file  Other Topics Concern   Not on file  Social History Narrative   Not on file   Social  Drivers of Health   Financial Resource Strain: Not on file  Food Insecurity: Not on file  Transportation Needs: Not on file  Physical Activity: Not on file  Stress: Not on file  Social Connections: Not on file     Review of Systems  Constitutional:  Negative for appetite change and unexpected weight change.  HENT:  Negative for congestion, sinus pressure and sore throat.   Eyes:  Negative for pain and visual disturbance.  Respiratory:  Negative for cough, chest tightness and shortness of breath.   Cardiovascular:  Negative for chest pain and palpitations.  Gastrointestinal:  Negative for abdominal pain, diarrhea, nausea and vomiting.  Genitourinary:  Negative for difficulty urinating and dysuria.  Musculoskeletal:  Negative for joint swelling and myalgias.  Skin:  Negative for color change and rash.  Neurological:  Negative for dizziness and headaches.  Hematological:  Negative for adenopathy. Does not bruise/bleed easily.  Psychiatric/Behavioral:  Negative for agitation and dysphoric mood.        Objective:     BP 128/74   Pulse 89   Temp 98.2 F (36.8 C)   Resp 16   Ht 5\' 6"  (1.676 m)   Wt 173 lb (  78.5 kg)   LMP 02/24/2011   SpO2 98%   BMI 27.92 kg/m  Wt Readings from Last 3 Encounters:  05/26/23 173 lb (78.5 kg)  02/10/23 172 lb (78 kg)  04/26/22 170 lb 9.6 oz (77.4 kg)    Physical Exam Vitals reviewed.  Constitutional:      General: She is not in acute distress.    Appearance: Normal appearance. She is well-developed.  HENT:     Head: Normocephalic and atraumatic.     Right Ear: External ear normal.     Left Ear: External ear normal.     Mouth/Throat:     Pharynx: No oropharyngeal exudate or posterior oropharyngeal erythema.  Eyes:     General: No scleral icterus.       Right eye: No discharge.        Left eye: No discharge.     Conjunctiva/sclera: Conjunctivae normal.  Neck:     Thyroid: No thyromegaly.  Cardiovascular:     Rate and Rhythm:  Normal rate and regular rhythm.  Pulmonary:     Effort: No tachypnea, accessory muscle usage or respiratory distress.     Breath sounds: Normal breath sounds. No decreased breath sounds or wheezing.  Chest:  Breasts:    Right: No inverted nipple, mass, nipple discharge or tenderness (no axillary adenopathy).     Left: No inverted nipple, mass, nipple discharge or tenderness (no axilarry adenopathy).  Abdominal:     General: Bowel sounds are normal.     Palpations: Abdomen is soft.     Tenderness: There is no abdominal tenderness.  Genitourinary:    Comments: Normal external genitalia.  Vaginal vault without lesions.  Cervix identified.  Pap smear performed.  Could not appreciate any adnexal masses or tenderness.   Musculoskeletal:        General: No swelling or tenderness.     Cervical back: Neck supple.  Lymphadenopathy:     Cervical: No cervical adenopathy.  Skin:    Findings: No erythema or rash.  Neurological:     Mental Status: She is alert and oriented to person, place, and time.  Psychiatric:        Mood and Affect: Mood normal.        Behavior: Behavior normal.         Outpatient Encounter Medications as of 05/26/2023  Medication Sig   acetaminophen (TYLENOL) 500 MG tablet Take 500 mg by mouth as needed.   aspirin 325 MG EC tablet Take 325 mg by mouth every 6 (six) hours as needed for pain.   cyanocobalamin (VITAMIN B12) 1000 MCG/ML injection inject once every 30 days.   escitalopram (LEXAPRO) 10 MG tablet TAKE 1 & 1/2 TABLET BY MOUTH EVERY DAY   levocetirizine (XYZAL) 5 MG tablet TAKE 1 TABLET(5 MG) BY MOUTH EVERY EVENING   LORazepam (ATIVAN) 0.5 MG tablet 1/2 tablet q day prn   montelukast (SINGULAIR) 10 MG tablet TAKE 1 TABLET(10 MG) BY MOUTH AT BEDTIME   phenytoin (DILANTIN) 100 MG ER capsule Take 1 capsule (100 mg total) by mouth 3 (three) times daily.   promethazine (PHENERGAN) 25 MG tablet Take by mouth.   SUMAtriptan (IMITREX) 50 MG tablet Take by mouth.    SYRINGE-NEEDLE, DISP, 3 ML (B-D 3CC LUER-LOK SYR 25GX1") 25G X 1" 3 ML MISC USE AS DIRECTED   topiramate (TOPAMAX) 50 MG tablet Take 0.5 mg by mouth in the morning and at bedtime.   [DISCONTINUED] cyanocobalamin (VITAMIN B12) 1000 MCG/ML injection Inject 1  mL intramuscularly once a week for 4 weeks   No facility-administered encounter medications on file as of 05/26/2023.     Lab Results  Component Value Date   WBC 6.3 02/10/2023   HGB 13.4 02/10/2023   HCT 40.8 02/10/2023   PLT 332.0 02/10/2023   GLUCOSE 57 (L) 02/10/2023   CHOL 222 (H) 02/10/2023   TRIG 179.0 (H) 02/10/2023   HDL 74.60 02/10/2023   LDLCALC 111 (H) 02/10/2023   ALT 17 02/10/2023   AST 14 02/10/2023   NA 140 02/10/2023   K 4.2 02/10/2023   CL 106 02/10/2023   CREATININE 0.83 02/10/2023   BUN 11 02/10/2023   CO2 28 02/10/2023   TSH 3.09 02/10/2023    CT CORONARY MORPH W/CTA COR W/SCORE W/CA W/CM &/OR WO/CM Addendum Date: 02/05/2021 ADDENDUM REPORT: 02/05/2021 16:22 EXAM: OVER-READ INTERPRETATION  CT CHEST The following report is an over-read performed by radiologist Dr. Schuyler [facility] on 02/05/2021. This over-read does not include interpretation of cardiac or coronary anatomy or pathology. The coronary CTA interpretation by the cardiologist is attached. COMPARISON:  None. FINDINGS: No mediastinal mass or adenopathy. Subsegmental atelectasis or scar noted in the left lower lobe. No pleural effusion, airspace consolidation, or atelectasis. No acute abnormality noted within the limited images through the upper abdomen. No acute or suspicious osseous findings. IMPRESSION: 1. No significant noncardiac supplemental findings. 2. Subsegmental atelectasis or scar noted in the left lower lobe. Electronically Signed   By: Signa Kell M.D.   On: 02/05/2021 16:22   Result Date: 02/05/2021 CLINICAL DATA:  Chest pain EXAM: Cardiac/Coronary  CTA TECHNIQUE: The patient was scanned on a Siemens Somatom go.Top  scanner. : A retrospective scan was triggered in the descending thoracic aorta. Axial non-contrast 3 mm slices were carried out through the heart. The data set was analyzed on a dedicated work station and scored using the Agatson method. Gantry rotation speed was 330 msecs and collimation was .6 mm. 100mg  of metoprolol and 0.8 mg of sl NTG was given. The 3D data set was reconstructed in 5% intervals of the 60-95 % of the R-R cycle. Diastolic phases were analyzed on a dedicated work station using MPR, MIP and VRT modes. The patient received 75 cc of contrast. FINDINGS: Aorta:  Normal size.  No calcifications.  No dissection. Aortic Valve:  Trileaflet.  No calcifications. Coronary Arteries:  Normal coronary origin.  Right dominance. RCA is a dominant artery that gives rise to PDA and PLA. There is no disease. Left main is a large artery that gives rise to LAD and LCX arteries. No LM disease is noted LAD has no plaque. LCX is a non-dominant artery that gives rise to two obtuse marginal branches. There is no plaque. Other findings: Normal pulmonary vein drainage into the left atrium. Normal left atrial appendage without a thrombus. Normal size of the pulmonary artery. IMPRESSION: 1. Normal coronary calcium score of 0. Patient is low risk for coronary events. 2. Normal coronary origin with right dominance. 3. No evidence of CAD. 4. CAD-RADS 0.  Consider non-atherosclerotic causes of chest pain. Electronically Signed: By: Debbe Odea M.D. On: 02/05/2021 14:49       Assessment & Plan:  Routine general medical examination at a health care facility  Health care maintenance Assessment & Plan: Physical today 05/26/23.  PAP 08/13/19 - negative with negative HPV.  Repeat pap today. S/p hysterectomy.  Mammogram 06/14/22 - Birads I.  Discussed colonoscopy and screening for colon cancer.  Will notify me when agreeable. Schedule bone density.    Screening for cervical cancer -     Cytology - PAP  Stress Assessment  & Plan: On lexapro.  Increased stress as outlined.  Overall is some better. Discussed.  Will notify me if feels needs further intervention.    Seizure disorder [facility]) Assessment & Plan: Continue dilantin.  Stable.     Murmur Assessment & Plan: Murmur on exam - appears more prominent. Last echo 2022 - mild to moderate MR. Normal EF.  Have her f/u with Dr Kirke Corin - question of need for f/u ECHO.   Orders: -     Ambulatory referral to Cardiology  Other migraine without status migrainosus, not intractable Assessment & Plan: On topamax.  Has seen neurology.  Overall stable.    B12 deficiency Assessment & Plan: B12 injections.    Depression, unspecified depression type Assessment & Plan: On lexapro.  Increased stress as outlined.  Appears to be some better. Discussed.  Will notify me if feels needs any further intervention.  Follow.    Estrogen deficiency -     DG Bone Density; Future     Dale Oglethorpe, MD

## 2023-05-26 NOTE — Assessment & Plan Note (Addendum)
Physical today 05/26/23.  PAP 08/13/19 - negative with negative HPV.  Repeat pap today. S/p hysterectomy.  Mammogram 06/14/22 - Birads I.  Discussed colonoscopy and screening for colon cancer.  Will notify me when agreeable. Schedule bone density.

## 2023-05-29 ENCOUNTER — Encounter: Payer: Self-pay | Admitting: Internal Medicine

## 2023-05-29 NOTE — Assessment & Plan Note (Signed)
On lexapro.  Increased stress as outlined.  Overall is some better. Discussed.  Will notify me if feels needs further intervention.

## 2023-05-29 NOTE — Assessment & Plan Note (Signed)
Murmur on exam - appears more prominent. Last echo 2022 - mild to moderate MR. Normal EF.  Have her f/u with Dr Kirke Corin - question of need for f/u ECHO.

## 2023-05-29 NOTE — Assessment & Plan Note (Signed)
B12 injections

## 2023-05-29 NOTE — Assessment & Plan Note (Signed)
Continue dilantin.  Stable.

## 2023-05-29 NOTE — Assessment & Plan Note (Signed)
On topamax.  Has seen neurology.  Overall stable.

## 2023-05-29 NOTE — Assessment & Plan Note (Signed)
On lexapro.  Increased stress as outlined.  Appears to be some better. Discussed.  Will notify me if feels needs any further intervention.  Follow.

## 2023-06-01 LAB — CYTOLOGY - PAP
Comment: NEGATIVE
Diagnosis: NEGATIVE
High risk HPV: NEGATIVE

## 2023-06-02 ENCOUNTER — Encounter: Payer: Self-pay | Admitting: Internal Medicine

## 2023-06-03 DIAGNOSIS — J301 Allergic rhinitis due to pollen: Secondary | ICD-10-CM | POA: Diagnosis not present

## 2023-06-03 DIAGNOSIS — J3089 Other allergic rhinitis: Secondary | ICD-10-CM | POA: Diagnosis not present

## 2023-06-03 DIAGNOSIS — J3081 Allergic rhinitis due to animal (cat) (dog) hair and dander: Secondary | ICD-10-CM | POA: Diagnosis not present

## 2023-06-09 DIAGNOSIS — J3089 Other allergic rhinitis: Secondary | ICD-10-CM | POA: Diagnosis not present

## 2023-06-09 DIAGNOSIS — J301 Allergic rhinitis due to pollen: Secondary | ICD-10-CM | POA: Diagnosis not present

## 2023-06-09 DIAGNOSIS — J3081 Allergic rhinitis due to animal (cat) (dog) hair and dander: Secondary | ICD-10-CM | POA: Diagnosis not present

## 2023-06-14 DIAGNOSIS — J301 Allergic rhinitis due to pollen: Secondary | ICD-10-CM | POA: Diagnosis not present

## 2023-06-15 DIAGNOSIS — J3081 Allergic rhinitis due to animal (cat) (dog) hair and dander: Secondary | ICD-10-CM | POA: Diagnosis not present

## 2023-06-15 DIAGNOSIS — J3089 Other allergic rhinitis: Secondary | ICD-10-CM | POA: Diagnosis not present

## 2023-06-16 DIAGNOSIS — J3089 Other allergic rhinitis: Secondary | ICD-10-CM | POA: Diagnosis not present

## 2023-06-16 DIAGNOSIS — J301 Allergic rhinitis due to pollen: Secondary | ICD-10-CM | POA: Diagnosis not present

## 2023-06-16 DIAGNOSIS — J3081 Allergic rhinitis due to animal (cat) (dog) hair and dander: Secondary | ICD-10-CM | POA: Diagnosis not present

## 2023-06-20 DIAGNOSIS — Z1231 Encounter for screening mammogram for malignant neoplasm of breast: Secondary | ICD-10-CM | POA: Diagnosis not present

## 2023-06-20 LAB — HM MAMMOGRAPHY

## 2023-06-21 ENCOUNTER — Encounter: Payer: Self-pay | Admitting: Internal Medicine

## 2023-06-23 DIAGNOSIS — J3089 Other allergic rhinitis: Secondary | ICD-10-CM | POA: Diagnosis not present

## 2023-06-23 DIAGNOSIS — J3081 Allergic rhinitis due to animal (cat) (dog) hair and dander: Secondary | ICD-10-CM | POA: Diagnosis not present

## 2023-06-23 DIAGNOSIS — J301 Allergic rhinitis due to pollen: Secondary | ICD-10-CM | POA: Diagnosis not present

## 2023-09-16 ENCOUNTER — Telehealth: Payer: Self-pay

## 2023-09-16 NOTE — Telephone Encounter (Signed)
 Left message to call the office back.

## 2023-09-16 NOTE — Telephone Encounter (Signed)
 We received a medical clearance from [facility] for pt's routine cleaning. Pt stated that they require a medical clearance because she has mitral valve prolapse. The cleaning has not been scheduled. Explained to pt that she would need to schedule an appt with Dr. Geralyn Knee before she could clear her for the cleaning. Pt stated that she can not miss anymore work right now so she will try to get the clearance from her cardiologist when she sees him on May 30th. However pt stated that if Dr. Geralyn Knee still needs to see she will come in but it would need to be as late as possible on a Friday.

## 2023-09-16 NOTE — Telephone Encounter (Signed)
 I can see her 09/20/23 at 4:00 or 6/13 at 4:00.

## 2023-09-20 NOTE — Telephone Encounter (Signed)
 Patient says this is for a routine cleaning. She is going to talk to Dr Alvenia Aus on 5/30 and then will let me know if needs sooner appt with Dr Geralyn Knee.

## 2023-10-07 ENCOUNTER — Ambulatory Visit: Payer: BC Managed Care – PPO | Admitting: Cardiovascular Disease

## 2023-10-07 ENCOUNTER — Telehealth: Payer: Self-pay | Admitting: Internal Medicine

## 2023-10-07 NOTE — Telephone Encounter (Signed)
 Dr Geralyn Knee will not be in the office the day of your scheduled visit 11/24/23 . Please give the office a call at [phone] and we will get you rescheduled.  Thank you  LMOM and sent above message via MyChart. E2C2 please reschedule this pt when they call back -[facility]

## 2023-10-31 ENCOUNTER — Other Ambulatory Visit: Payer: Self-pay | Admitting: Internal Medicine

## 2023-11-08 ENCOUNTER — Encounter: Payer: Self-pay | Admitting: *Deleted

## 2023-11-08 ENCOUNTER — Encounter: Payer: Self-pay | Admitting: Cardiovascular Disease

## 2023-11-08 ENCOUNTER — Ambulatory Visit: Attending: Cardiovascular Disease | Admitting: Cardiovascular Disease

## 2023-11-08 VITALS — BP 110/74 | HR 74 | Ht 66.0 in | Wt 177.4 lb

## 2023-11-08 DIAGNOSIS — I341 Nonrheumatic mitral (valve) prolapse: Secondary | ICD-10-CM | POA: Diagnosis not present

## 2023-11-08 NOTE — Progress Notes (Signed)
 Cardiology Office Note   Date:  11/08/2023   ID:  Alyssa Bautista, DOB 1971-04-21, MRN [id number]  PCP:  Glendia Shad, MD  Cardiologist:   Deatrice Cage, MD   Chief Complaint  Patient presents with   Follow-up    OD 12 month f/u clearance for dental cleaning, pt would like to discuss echo and thoughts on starting hormone replacement. Meds reviewed verbally with pt.      History of Present Illness: Alyssa Bautista is a 53 y.o. female who is here today for a follow-up visit regarding mitral valve prolapse with moderate regurgitation.    She has chronic medical conditions that include migraine headaches, seizures, depression and abnormal pap smear requiring hysterectomy.    She had previous vasovagal syncope.  She had cardiac CTA done in September 2022 which showed a calcium score of 0 with no significant coronary artery disease.  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  There was holosystolic prolapse of both leaflets.  She has been doing well with no chest pain, shortness of breath or palpitations.  Past Medical History:  Diagnosis Date   Anxiety    Arthritis    Depression    Environmental allergies    Gestational diabetes    History of chicken pox    Migraines    MVP (mitral valve prolapse)    Seizures (HCC)     Past Surgical History:  Procedure Laterality Date   ABDOMINAL HYSTERECTOMY  02/2011   abnormal pap (stage III dysplasia).   ovaries not removed.   abscess removal  2007   cervix    DILATION AND CURETTAGE OF UTERUS  2002, 2005   LEEP  2008   WISDOM TOOTH EXTRACTION  2003     Current Outpatient Medications  Medication Sig Dispense Refill   acetaminophen  (TYLENOL ) 500 MG tablet Take 500 mg by mouth as needed.     aspirin 325 MG EC tablet Take 325 mg by mouth every 6 (six) hours as needed for pain.     cyanocobalamin  (VITAMIN  B12) 1000 MCG/ML injection inject once every 30 days. 10 mL 0   escitalopram  (LEXAPRO ) 10 MG tablet TAKE 1 & 1/2 TABLET BY MOUTH EVERY DAY 45 tablet 3   levocetirizine (XYZAL ) 5 MG tablet TAKE 1 TABLET(5 MG) BY MOUTH EVERY EVENING 30 tablet 4   LORazepam  (ATIVAN ) 0.5 MG tablet 1/2 tablet q day prn 30 tablet 0   montelukast  (SINGULAIR ) 10 MG tablet TAKE 1 TABLET BY MOUTH AT BEDTIME 90 tablet 1   phenytoin  (DILANTIN ) 100 MG ER capsule Take 1 capsule (100 mg total) by mouth 3 (three) times daily. 90 capsule 0   promethazine (PHENERGAN) 25 MG tablet Take by mouth. (Patient taking differently: Take 25 mg by mouth as needed.)     SUMAtriptan (IMITREX) 50 MG tablet Take by mouth. (Patient taking differently: Take 50 mg by mouth as needed.)     SYRINGE-NEEDLE, DISP, 3 ML (B-D 3CC LUER-LOK SYR 25GX1) 25G X 1 3 ML MISC USE AS DIRECTED 20 each 0   topiramate (TOPAMAX) 50 MG tablet Take 0.5 mg by mouth in the morning and at bedtime.     No current facility-administered medications for this visit.    Allergies:   Penicillins, Azithromycin, Dilantin  [phenytoin ], and Clindamycin/lincomycin    Social History:  The patient  reports that she has never smoked. She has never used smokeless tobacco.  She reports that she does not drink alcohol and does not use drugs.   Family History:  The patient's family history includes Breast cancer in her maternal aunt; Diabetes in her paternal grandfather; Hypercholesterolemia in her father and mother; Prostate cancer in her paternal grandfather.    ROS:  Please see the history of present illness.   Otherwise, review of systems are positive for none.   All other systems are reviewed and negative.    PHYSICAL EXAM: VS:  BP 110/74 (BP Location: Left Arm, Patient Position: Sitting, Cuff Size: Normal)   Pulse 74   Ht 5' 6 (1.676 m)   Wt 177 lb 6 oz (80.5 kg)   LMP 02/24/2011   SpO2 99%   BMI 28.63 kg/m  , BMI Body mass index is 28.63 kg/m. GEN: Well nourished, well  developed, in no acute distress  HEENT: normal  Neck: no JVD, carotid bruits, or masses Cardiac: RRR; no rubs, or gallops,no edema .  2 out of 6 holosystolic murmur at the apex Respiratory:  clear to auscultation bilaterally, normal work of breathing GI: soft, nontender, nondistended, + BS MS: no deformity or atrophy  Skin: warm and dry, no rash Neuro:  Strength and sensation are intact Psych: euthymic mood, full affect   EKG:  EKG is ordered today. The ekg ordered today: Normal sinus rhythm Normal ECG     Recent Labs: 02/10/2023: ALT 17; BUN 11; Creatinine, Ser 0.83; Hemoglobin 13.4; Platelets 332.0; Potassium 4.2; Sodium 140; TSH 3.09    Lipid Panel    Component Value Date/Time   CHOL 222 (H) 02/10/2023 1217   TRIG 179.0 (H) 02/10/2023 1217   HDL 74.60 02/10/2023 1217   CHOLHDL 3 02/10/2023 1217   VLDL 35.8 02/10/2023 1217   LDLCALC 111 (H) 02/10/2023 1217      Wt Readings from Last 3 Encounters:  11/08/23 177 lb 6 oz (80.5 kg)  05/26/23 173 lb (78.5 kg)  02/10/23 172 lb (78 kg)           No data to display            ASSESSMENT AND PLAN:   1.  Mitral valve prolapse with mild to moderate mitral regurgitation: No evaluation since 2022.  I requested a follow-up echocardiogram.  I provided her that she can undergo dental cleaning from a cardiac standpoint with no need for antibiotic prophylaxis.  2.  Previous presyncope: Thought to be vasovagal.  No recurrent episodes.  3.  Menopause: She is considering hormone replacement therapy given severe symptoms of hot flashes.  No contraindication from a cardiac standpoint.   Disposition:   FU in 1 year.  Signed,  Deatrice Cage, MD  11/08/2023 3:57 PM    [facility]

## 2023-11-08 NOTE — Patient Instructions (Signed)
 Medication Instructions:  No changes *If you need a refill on your cardiac medications before your next appointment, please call your pharmacy*  Lab Work None ordered If you have labs (blood work) drawn today and your tests are completely normal, you will receive your results only by: MyChart Message (if you have MyChart) OR A paper copy in the mail If you have any lab test that is abnormal or we need to change your treatment, we will call you to review the results.  Testing/Procedures: Your physician has requested that you have an echocardiogram. Echocardiography is a painless test that uses sound waves to create images of your heart. It provides your doctor with information about the size and shape of your heart and how well your heart's chambers and valves are working.   You may receive an ultrasound enhancing agent through an IV if needed to better visualize your heart during the echo. This procedure takes approximately one hour.  There are no restrictions for this procedure.  This will take place at [address] [facility] [address], [place] [address]  Please note: We ask at that you not bring children with you during ultrasound (echo/ vascular) testing. Due to room size and safety concerns, children are not allowed in the ultrasound rooms during exams. Our front office staff cannot provide observation of children in our lobby area while testing is being conducted. An adult accompanying a patient to their appointment will only be allowed in the ultrasound room at the discretion of the ultrasound technician under special circumstances. We apologize for any inconvenience.   Follow-Up: At [facility], you and your health needs are our priority.  As part of our continuing mission to provide you with exceptional heart care, our providers are all part of one team.  This team includes your primary Cardiologist (physician) and Advanced Practice Providers or APPs  (Physician Assistants and Nurse Practitioners) who all work together to provide you with the care you need, when you need it.  Your next appointment:   12 month(s)  Provider:   You may see Antionette Kirks, MD or one of the following Advanced Practice Providers on your designated Care Team:   Laneta Pintos, NP Gildardo Labrador, PA-C Varney Gentleman, PA-C Cadence Hampstead, PA-C Ronald Cockayne, NP Morey Ar, NP    We recommend signing up for the patient portal called "MyChart".  Sign up information is provided on this After Visit Summary.  MyChart is used to connect with patients for Virtual Visits (Telemedicine).  Patients are able to view lab/test results, encounter notes, upcoming appointments, etc.  Non-urgent messages can be sent to your provider as well.   To learn more about what you can do with MyChart, go to [email or web address].

## 2023-11-12 ENCOUNTER — Other Ambulatory Visit: Payer: Self-pay | Admitting: Internal Medicine

## 2023-11-24 ENCOUNTER — Ambulatory Visit: Payer: BC Managed Care – PPO | Admitting: Internal Medicine

## 2023-11-24 ENCOUNTER — Ambulatory Visit: Attending: Cardiovascular Disease

## 2023-11-24 DIAGNOSIS — I341 Nonrheumatic mitral (valve) prolapse: Secondary | ICD-10-CM

## 2023-11-24 LAB — ECHOCARDIOGRAM COMPLETE
AV Mean grad: 3 mmHg
AV Peak grad: 5.4 mmHg
Ao pk vel: 1.16 m/s
Area-P 1/2: 3.91 cm2
S' Lateral: 2.87 cm

## 2023-11-28 ENCOUNTER — Ambulatory Visit: Payer: Self-pay | Admitting: Internal Medicine

## 2024-02-01 ENCOUNTER — Other Ambulatory Visit: Payer: Self-pay | Admitting: Internal Medicine

## 2024-02-17 ENCOUNTER — Ambulatory Visit (INDEPENDENT_AMBULATORY_CARE_PROVIDER_SITE_OTHER): Admitting: Internal Medicine

## 2024-02-17 VITALS — BP 132/66 | HR 79 | Ht 66.0 in | Wt 177.0 lb

## 2024-02-17 DIAGNOSIS — E559 Vitamin D deficiency, unspecified: Secondary | ICD-10-CM

## 2024-02-17 DIAGNOSIS — G43809 Other migraine, not intractable, without status migrainosus: Secondary | ICD-10-CM

## 2024-02-17 DIAGNOSIS — G40909 Epilepsy, unspecified, not intractable, without status epilepticus: Secondary | ICD-10-CM

## 2024-02-17 DIAGNOSIS — F32A Depression, unspecified: Secondary | ICD-10-CM

## 2024-02-17 DIAGNOSIS — N951 Menopausal and female climacteric states: Secondary | ICD-10-CM

## 2024-02-17 DIAGNOSIS — R7301 Impaired fasting glucose: Secondary | ICD-10-CM

## 2024-02-17 DIAGNOSIS — Z13220 Encounter for screening for lipoid disorders: Secondary | ICD-10-CM

## 2024-02-17 DIAGNOSIS — R5383 Other fatigue: Secondary | ICD-10-CM | POA: Diagnosis not present

## 2024-02-17 DIAGNOSIS — E538 Deficiency of other specified B group vitamins: Secondary | ICD-10-CM | POA: Diagnosis not present

## 2024-02-17 DIAGNOSIS — F439 Reaction to severe stress, unspecified: Secondary | ICD-10-CM

## 2024-02-17 DIAGNOSIS — Z23 Encounter for immunization: Secondary | ICD-10-CM | POA: Diagnosis not present

## 2024-02-17 DIAGNOSIS — I341 Nonrheumatic mitral (valve) prolapse: Secondary | ICD-10-CM

## 2024-02-17 MED ORDER — MONTELUKAST SODIUM 10 MG PO TABS: 10.0000 mg | ORAL_TABLET | Freq: Every day | ORAL | 1 refills | Status: DC

## 2024-02-17 MED ORDER — CYANOCOBALAMIN 1000 MCG/ML IJ SOLN: INTRAMUSCULAR | 1 refills | Status: AC

## 2024-02-17 NOTE — Progress Notes (Signed)
 Subjective:    Patient ID: Alyssa Bautista, female    DOB: Nov 08, 1970, 53 y.o.   MRN: [id number]  Patient here for  Chief Complaint  Patient presents with   Medical Management of Chronic Issues    HPI Here for a scheduled follow up - follow up regarding allergies and depression. On lexapro . Increased stress - stress with family issues - son living in [place]. New job. Work stress is better. Recently moved into a new house.  Saw neurology 07/27/23 - f/u history of seizure disorder - recommended to continue dilantin , topamax. Also f/u regarding migraines - recommended to continue topamax and has sumatriptan to take prn. Headaches stable. 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 ECHO 11/24/23 - mild to moderate MR with EF 60-65%. Stable. F/u one year. Reports around 05/2023 - started having hot flashes, hair loss, feels like she is more grumpy and has trouble with thinking or remembering thins at time. Hot flashes have improved. Feeling better. Does not feel needs further medication or intervention at this time. Request FSH to be checked. Breathing stable. No cough or congestion.    Past Medical History:  Diagnosis Date   Allergy    INDOOR & OUTDOOR - ON ALLERGY SHOTS AND ANTIHISTAMINES   Anxiety    Arthritis    Depression    Environmental allergies    Gestational diabetes    Heart murmur 1996   MVP   History of chicken pox    Migraines    MVP (mitral valve prolapse)    Seizures (HCC) 1996   Past Surgical History:  Procedure Laterality Date   ABDOMINAL HYSTERECTOMY  02/2011   abnormal pap (stage III dysplasia).   ovaries not removed.   abscess removal  2007   cervix    DILATION AND CURETTAGE OF UTERUS  2002, 2005   LEEP  2008   WISDOM TOOTH EXTRACTION  2003   Family History  Problem Relation Age of Onset   Hypercholesterolemia Mother    Anxiety disorder Mother    Arthritis Mother    Depression Mother    Hyperlipidemia Mother    Hypertension Mother     Miscarriages / [place] Mother    Stroke Mother    Vision loss Mother    Hypercholesterolemia Father    Arthritis Father    Hyperlipidemia Father    Diabetes Paternal Grandfather    Prostate cancer Paternal Grandfather    Arthritis Paternal Grandfather    Cancer Paternal Grandfather    Hyperlipidemia Paternal Grandfather    Breast cancer Maternal Aunt        50's   Anxiety disorder Maternal Aunt    Depression Maternal Aunt    Hyperlipidemia Maternal Aunt    Alcohol abuse Maternal Grandfather    Stroke Maternal Grandfather    Stroke Maternal Grandmother    Arthritis Paternal Grandmother        ? RHEUMATOID   Heart disease Paternal Grandmother        CHF   Hyperlipidemia Paternal Grandmother    Hypertension Paternal Grandmother    Stroke Paternal Grandmother    Varicose Veins Paternal Grandmother    Hyperlipidemia Maternal Aunt    Arthritis Paternal Aunt    Cancer Paternal Aunt    Miscarriages / Stillbirths Paternal Aunt    Hyperlipidemia Maternal Uncle    Stroke Maternal Uncle    Arthritis Paternal Aunt        RHEUMATOID   Miscarriages / Stillbirths Paternal Aunt  MISCARRIAGE   Hyperlipidemia Maternal Uncle    Stroke Maternal Uncle    Colon cancer Neg Hx    Social History   Socioeconomic History   Marital status: Divorced    Spouse name: Not on file   Number of children: 2   Years of education: Not on file   Highest education level: Bachelor's degree (e.g., BA, AB, BS)  Occupational History   Not on file  Tobacco Use   Smoking status: Never   Smokeless tobacco: Never  Substance and Sexual Activity   Alcohol use: No    Alcohol/week: 0.0 standard drinks of alcohol    Comment: rarely   Drug use: No   Sexual activity: Not on file  Other Topics Concern   Not on file  Social History Narrative   Not on file   Social Drivers of Health   Financial Resource Strain: Low Risk  (02/17/2024)   Overall Financial Resource Strain (CARDIA)    Difficulty  of Paying Living Expenses: Not hard at all  Food Insecurity: No Food Insecurity (02/17/2024)   Hunger Vital Sign    Worried About Running Out of Food in the Last Year: Never true    Ran Out of Food in the Last Year: Never true  Transportation Needs: No Transportation Needs (02/17/2024)   PRAPARE - Administrator, Civil Service (Medical): No    Lack of Transportation (Non-Medical): No  Physical Activity: Insufficiently Active (02/17/2024)   Exercise Vital Sign    Days of Exercise per Week: 3 days    Minutes of Exercise per Session: 20 min  Stress: No Stress Concern Present (02/17/2024)   [employer] of Occupational Health - Occupational Stress Questionnaire    Feeling of Stress: Only a little  Social Connections: Moderately Integrated (02/17/2024)   Social Connection and Isolation Panel    Frequency of Communication with Friends and Family: Twice a week    Frequency of Social Gatherings with Friends and Family: Twice a week    Attends Religious Services: 1 to 4 times per year    Active Member of [employer] or Organizations: No    Attends Engineer, structural: Not on file    Marital Status: Living with partner     Review of Systems  Constitutional:  Negative for appetite change and unexpected weight change.  HENT:  Negative for congestion and sinus pressure.   Respiratory:  Negative for cough, chest tightness and shortness of breath.   Cardiovascular:  Negative for chest pain, palpitations and leg swelling.  Gastrointestinal:  Negative for abdominal pain, diarrhea, nausea and vomiting.  Genitourinary:  Negative for difficulty urinating and dysuria.  Musculoskeletal:  Negative for gait problem and joint swelling.  Skin:  Negative for color change and rash.  Neurological:  Negative for dizziness.       Headaches - stable.   Hematological:        Increased stress.   Psychiatric/Behavioral:  Negative for agitation and dysphoric mood.        Objective:      BP 132/66   Pulse 79   Ht 5' 6 (1.676 m)   Wt 177 lb (80.3 kg)   LMP 02/24/2011   SpO2 99%   BMI 28.57 kg/m  Wt Readings from Last 3 Encounters:  02/17/24 177 lb (80.3 kg)  11/08/23 177 lb 6 oz (80.5 kg)  05/26/23 173 lb (78.5 kg)    Physical Exam Vitals reviewed.  Constitutional:      General: She  is not in acute distress.    Appearance: Normal appearance.  HENT:     Head: Normocephalic and atraumatic.     Right Ear: External ear normal.     Left Ear: External ear normal.     Mouth/Throat:     Pharynx: No oropharyngeal exudate or posterior oropharyngeal erythema.  Eyes:     General: No scleral icterus.       Right eye: No discharge.        Left eye: No discharge.     Conjunctiva/sclera: Conjunctivae normal.  Neck:     Thyroid : No thyromegaly.  Cardiovascular:     Rate and Rhythm: Normal rate and regular rhythm.  Pulmonary:     Effort: No respiratory distress.     Breath sounds: Normal breath sounds. No wheezing.  Abdominal:     General: Bowel sounds are normal.     Palpations: Abdomen is soft.     Tenderness: There is no abdominal tenderness.  Musculoskeletal:        General: No swelling or tenderness.     Cervical back: Neck supple. No tenderness.  Lymphadenopathy:     Cervical: No cervical adenopathy.  Skin:    Findings: No erythema or rash.  Neurological:     Mental Status: She is alert.  Psychiatric:        Mood and Affect: Mood normal.        Behavior: Behavior normal.         Outpatient Encounter Medications as of 02/17/2024  Medication Sig   acetaminophen  (TYLENOL ) 500 MG tablet Take 500 mg by mouth as needed.   aspirin 325 MG EC tablet Take 325 mg by mouth every 6 (six) hours as needed for pain.   escitalopram  (LEXAPRO ) 10 MG tablet TAKE 1 AND 1/2 TABLETS BY MOUTH DAILY   levocetirizine (XYZAL ) 5 MG tablet TAKE 1 TABLET(5 MG) BY MOUTH EVERY EVENING   LORazepam  (ATIVAN ) 0.5 MG tablet 1/2 tablet q day prn   phenytoin  (DILANTIN ) 100 MG ER  capsule Take 1 capsule (100 mg total) by mouth 3 (three) times daily.   promethazine (PHENERGAN) 25 MG tablet Take by mouth. (Patient taking differently: Take 25 mg by mouth as needed.)   SUMAtriptan (IMITREX) 50 MG tablet Take by mouth. (Patient taking differently: Take 50 mg by mouth as needed.)   SYRINGE-NEEDLE, DISP, 3 ML (B-D 3CC LUER-LOK SYR 25GX1) 25G X 1 3 ML MISC USE AS DIRECTED   topiramate (TOPAMAX) 50 MG tablet Take 0.5 mg by mouth in the morning and at bedtime.   [DISCONTINUED] cyanocobalamin  (VITAMIN B12) 1000 MCG/ML injection inject once every 30 days.   cyanocobalamin  (VITAMIN B12) 1000 MCG/ML injection Inject 1000mcg q 15 days.   montelukast  (SINGULAIR ) 10 MG tablet Take 1 tablet (10 mg total) by mouth at bedtime.   [DISCONTINUED] montelukast  (SINGULAIR ) 10 MG tablet TAKE 1 TABLET BY MOUTH AT BEDTIME   No facility-administered encounter medications on file as of 02/17/2024.     Lab Results  Component Value Date   WBC 6.3 02/10/2023   HGB 13.4 02/10/2023   HCT 40.8 02/10/2023   PLT 332.0 02/10/2023   GLUCOSE 57 (L) 02/10/2023   CHOL 222 (H) 02/10/2023   TRIG 179.0 (H) 02/10/2023   HDL 74.60 02/10/2023   LDLCALC 111 (H) 02/10/2023   ALT 17 02/10/2023   AST 14 02/10/2023   NA 140 02/10/2023   K 4.2 02/10/2023   CL 106 02/10/2023   CREATININE 0.83 02/10/2023   BUN  11 02/10/2023   CO2 28 02/10/2023   TSH 3.09 02/10/2023       Assessment & Plan:  B12 deficiency Assessment & Plan: Continue B12 injections. Neurology recommended biweekly B12 injections.   Orders: -     CBC with Differential/Platelet; Future  Screening cholesterol level -     Basic metabolic panel with GFR; Future -     Hepatic function panel; Future -     Lipid panel; Future  Other fatigue -     TSH; Future  Impaired fasting glucose -     Hemoglobin A1c; Future  Need for immunization against influenza -     Follicle stimulating hormone; Future -     Flu vaccine trivalent PF, 6mos and  older(Flulaval,Afluria,Fluarix,Fluzone)  Vitamin D  deficiency Assessment & Plan: Vitamin D  level - decreased on recent lab check by neurology. Being replaced. Follow.    Stress Assessment & Plan: Continues on lexapro . Discussed. She does not feel she needs any further intervention at this time. Follow.    Seizure disorder [facility]) Assessment & Plan: Continue dilantin  and topamax. Sees neurology. Stable.    MVP (mitral valve prolapse) Assessment & Plan:  Had f/u with cardiology 11/08/23 - f/u MVP with moderate MR. ECHO 11/24/23 - mild to moderate MR with EF 60-65%. Stable. F/u one year.    Other migraine without status migrainosus, not intractable Assessment & Plan: Continue topamax. Has sumatriptan if needed. Stable. Continue f/u with neurology.    Depression, unspecified depression type Assessment & Plan: Continue lexapro . Discussed increased stress. Overall she feels she is handling things relatively well. Stable.    Menopausal symptoms Assessment & Plan: Hot flashes have improved. Noticed mood changes, hair loss and some memory issues. Overall feeling better. Follow. Check FSH.    Other orders -     Montelukast  Sodium; Take 1 tablet (10 mg total) by mouth at bedtime.  Dispense: 90 tablet; Refill: 1 -     Cyanocobalamin ; Inject 1000mcg q 15 days.  Dispense: 30 mL; Refill: 1     Allena Hamilton, MD

## 2024-02-18 ENCOUNTER — Encounter: Payer: Self-pay | Admitting: Internal Medicine

## 2024-02-18 DIAGNOSIS — N951 Menopausal and female climacteric states: Secondary | ICD-10-CM | POA: Insufficient documentation

## 2024-02-18 NOTE — Assessment & Plan Note (Signed)
 Continue B12 injections. Neurology recommended biweekly B12 injections.

## 2024-02-18 NOTE — Assessment & Plan Note (Signed)
 Hot flashes have improved. Noticed mood changes, hair loss and some memory issues. Overall feeling better. Follow. Check FSH.

## 2024-02-18 NOTE — Assessment & Plan Note (Addendum)
 Vitamin D  level - decreased on recent lab check by neurology. Being replaced. Follow.

## 2024-02-18 NOTE — Assessment & Plan Note (Signed)
 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 ECHO 11/24/23 - mild to moderate MR with EF 60-65%. Stable. F/u one year.

## 2024-02-18 NOTE — Assessment & Plan Note (Signed)
 Continues on lexapro . Discussed. She does not feel she needs any further intervention at this time. Follow.

## 2024-02-18 NOTE — Assessment & Plan Note (Signed)
 Continue dilantin  and topamax. Sees neurology. Stable.

## 2024-02-18 NOTE — Assessment & Plan Note (Signed)
 Continue topamax. Has sumatriptan if needed. Stable. Continue f/u with neurology.

## 2024-02-18 NOTE — Assessment & Plan Note (Signed)
 Continue lexapro . Discussed increased stress. Overall she feels she is handling things relatively well. Stable.

## 2024-03-15 ENCOUNTER — Other Ambulatory Visit

## 2024-04-06 ENCOUNTER — Other Ambulatory Visit: Payer: Self-pay | Admitting: Internal Medicine

## 2024-04-09 ENCOUNTER — Ambulatory Visit
Admission: EM | Admit: 2024-04-09 | Discharge: 2024-04-09 | Disposition: A | Discharge status: Home / Self Care | Attending: Family Medicine | Admitting: Family Medicine

## 2024-04-09 DIAGNOSIS — J3089 Other allergic rhinitis: Secondary | ICD-10-CM | POA: Insufficient documentation

## 2024-04-09 DIAGNOSIS — J069 Acute upper respiratory infection, unspecified: Secondary | ICD-10-CM | POA: Diagnosis not present

## 2024-04-09 DIAGNOSIS — J309 Allergic rhinitis, unspecified: Secondary | ICD-10-CM | POA: Insufficient documentation

## 2024-04-09 DIAGNOSIS — H1045 Other chronic allergic conjunctivitis: Secondary | ICD-10-CM | POA: Insufficient documentation

## 2024-04-09 DIAGNOSIS — J3081 Allergic rhinitis due to animal (cat) (dog) hair and dander: Secondary | ICD-10-CM | POA: Insufficient documentation

## 2024-04-09 DIAGNOSIS — J301 Allergic rhinitis due to pollen: Secondary | ICD-10-CM | POA: Insufficient documentation

## 2024-04-09 DIAGNOSIS — B9689 Other specified bacterial agents as the cause of diseases classified elsewhere: Secondary | ICD-10-CM

## 2024-04-09 DIAGNOSIS — S92919A Unspecified fracture of unspecified toe(s), initial encounter for closed fracture: Secondary | ICD-10-CM | POA: Insufficient documentation

## 2024-04-09 MED ORDER — PREDNISONE 20 MG PO TABS: ORAL_TABLET | ORAL | 0 refills | Status: AC

## 2024-04-09 MED ORDER — CEFDINIR 300 MG PO CAPS: 300.0000 mg | ORAL_CAPSULE | Freq: Two times a day (BID) | ORAL | 0 refills | Status: AC

## 2024-04-09 NOTE — ED Triage Notes (Signed)
 Pt reports she being fighting a cold x 2 weeks. Pt reports ears are stuffy and painful, upper part of throat sis sore, dry in the mornings x 3 days. Tylenol  gives some relief.

## 2024-04-09 NOTE — Discharge Instructions (Addendum)
 We will manage this as a bacterial upper respiratory infection with cefdinir. For sore throat or cough try using a honey-based tea. Use 3 teaspoons of honey with juice squeezed from half lemon. Place shaved pieces of ginger into 1/2-1 cup of water and warm over stove top. Then mix the ingredients and repeat every 4 hours as needed. Please take Tylenol  500mg -650mg  every 6 hours for throat pain, fevers, aches and pains. Hydrate very well with at least 2 liters of water. Eat light meals such as soups (chicken and noodles, vegetable, chicken and wild rice).  Do not eat foods that you are allergic to.  Taking an antihistamine like Zyrtec can help against postnasal drainage, sinus congestion which can cause sinus pain, sinus headaches, throat pain, painful swallowing, coughing.  You can take this together with prednisone  to help with your rhinitis.

## 2024-04-09 NOTE — ED Provider Notes (Signed)
 [facility]  Note:  This document was prepared using Conservation officer, historic buildings and may include unintentional dictation errors.  MRN: [id number] DOB: August 17, 1970  Subjective:   Alyssa Bautista is a 53 y.o. female presenting for 2-week history of acute onset persistent sinus congestion, sinus drainage, throat pain, dry throat in the mornings.  Has a history of significant allergic rhinitis.  No asthma.  No current facility-administered medications for this encounter.  Current Outpatient Medications:    acetaminophen  (TYLENOL ) 500 MG tablet, Take 500 mg by mouth as needed., Disp: , Rfl:    aspirin 325 MG EC tablet, Take 325 mg by mouth every 6 (six) hours as needed for pain., Disp: , Rfl:    cyanocobalamin  (VITAMIN B12) 1000 MCG/ML injection, Inject 1000mcg q 15 days., Disp: 30 mL, Rfl: 1   escitalopram  (LEXAPRO ) 10 MG tablet, TAKE 1 AND 1/2 TABLETS BY MOUTH DAILY, Disp: 45 tablet, Rfl: 1   levocetirizine (XYZAL ) 5 MG tablet, TAKE 1 TABLET(5 MG) BY MOUTH EVERY EVENING, Disp: 30 tablet, Rfl: 4   LORazepam  (ATIVAN ) 0.5 MG tablet, 1/2 tablet q day prn, Disp: 30 tablet, Rfl: 0   montelukast  (SINGULAIR ) 10 MG tablet, Take 1 tablet (10 mg total) by mouth at bedtime., Disp: 90 tablet, Rfl: 1   phenytoin  (DILANTIN ) 100 MG ER capsule, Take 1 capsule (100 mg total) by mouth 3 (three) times daily., Disp: 90 capsule, Rfl: 0   promethazine (PHENERGAN) 25 MG tablet, Take by mouth. (Patient taking differently: Take 25 mg by mouth as needed.), Disp: , Rfl:    SUMAtriptan (IMITREX) 50 MG tablet, Take by mouth. (Patient taking differently: Take 50 mg by mouth as needed.), Disp: , Rfl:    SUMAtriptan (IMITREX) 50 MG tablet, Take 50 mg by mouth., Disp: , Rfl:    SYRINGE-NEEDLE, DISP, 3 ML (B-D 3CC LUER-LOK SYR 25GX1) 25G X 1 3 ML MISC, USE AS DIRECTED, Disp: 20 each, Rfl: 0   topiramate (TOPAMAX) 50 MG tablet, Take 0.5 mg by mouth in the morning and at bedtime., Disp: ,  Rfl:    Allergies  Allergen Reactions   Penicillins Rash   Azithromycin Other (See Comments)    GI upset   Dilantin  [Phenytoin ] Other (See Comments)    By IV, Vasovagil reaction   Clindamycin/Lincomycin Rash    Past Medical History:  Diagnosis Date   Allergy    INDOOR & OUTDOOR - ON ALLERGY SHOTS AND ANTIHISTAMINES   Anxiety    Arthritis    Depression    Environmental allergies    Gestational diabetes    Heart murmur 1996   MVP   History of chicken pox    Migraines    MVP (mitral valve prolapse)    Seizures (HCC) 1996     Past Surgical History:  Procedure Laterality Date   ABDOMINAL HYSTERECTOMY  02/2011   abnormal pap (stage III dysplasia).   ovaries not removed.   abscess removal  2007   cervix    DILATION AND CURETTAGE OF UTERUS  2002, 2005   LEEP  2008   WISDOM TOOTH EXTRACTION  2003    Family History  Problem Relation Age of Onset   Hypercholesterolemia Mother    Anxiety disorder Mother    Arthritis Mother    Depression Mother    Hyperlipidemia Mother    Hypertension Mother    Miscarriages / Stillbirths Mother    Stroke Mother    Vision loss Mother    Hypercholesterolemia  Father    Arthritis Father    Hyperlipidemia Father    Diabetes Paternal Grandfather    Prostate cancer Paternal Grandfather    Arthritis Paternal Grandfather    Cancer Paternal Grandfather    Hyperlipidemia Paternal Grandfather    Breast cancer Maternal Aunt        50's   Anxiety disorder Maternal Aunt    Depression Maternal Aunt    Hyperlipidemia Maternal Aunt    Alcohol abuse Maternal Grandfather    Stroke Maternal Grandfather    Stroke Maternal Grandmother    Arthritis Paternal Grandmother        ? RHEUMATOID   Heart disease Paternal Grandmother        CHF   Hyperlipidemia Paternal Grandmother    Hypertension Paternal Grandmother    Stroke Paternal Grandmother    Varicose Veins Paternal Grandmother    Hyperlipidemia Maternal Aunt    Arthritis Paternal Aunt     Cancer Paternal Aunt    Miscarriages / Stillbirths Paternal Aunt    Hyperlipidemia Maternal Uncle    Stroke Maternal Uncle    Arthritis Paternal Aunt        RHEUMATOID   Miscarriages / Stillbirths Paternal Aunt        MISCARRIAGE   Hyperlipidemia Maternal Uncle    Stroke Maternal Uncle    Colon cancer Neg Hx     Social History   Tobacco Use   Smoking status: Never   Smokeless tobacco: Never  Vaping Use   Vaping status: Never Used  Substance Use Topics   Alcohol use: No    Alcohol/week: 0.0 standard drinks of alcohol    Comment: rarely   Drug use: No    ROS   Objective:   Vitals: BP (!) 145/78 (BP Location: Left Arm)   Pulse 86   Temp 98.6 F (37 C) (Oral)   Resp 16   LMP 02/24/2011   SpO2 96%   Physical Exam Constitutional:      General: She is not in acute distress.    Appearance: Normal appearance. She is well-developed and normal weight. She is not ill-appearing, toxic-appearing or diaphoretic.  HENT:     Head: Normocephalic and atraumatic.     Right Ear: Tympanic membrane, ear canal and external ear normal. No drainage or tenderness. No middle ear effusion. There is no impacted cerumen. Tympanic membrane is not erythematous or bulging.     Left Ear: Tympanic membrane, ear canal and external ear normal. No drainage or tenderness.  No middle ear effusion. There is no impacted cerumen. Tympanic membrane is not erythematous or bulging.     Nose: Congestion present. No rhinorrhea.     Mouth/Throat:     Mouth: Mucous membranes are moist. No oral lesions.     Pharynx: Posterior oropharyngeal erythema (with associated postnasal drainage overlying pharynx) present. No pharyngeal swelling, oropharyngeal exudate or uvula swelling.     Tonsils: No tonsillar exudate or tonsillar abscesses. 0 on the right. 0 on the left.  Eyes:     General: No scleral icterus.       Right eye: No discharge.        Left eye: No discharge.     Extraocular Movements: Extraocular movements  intact.     Right eye: Normal extraocular motion.     Left eye: Normal extraocular motion.     Conjunctiva/sclera: Conjunctivae normal.  Cardiovascular:     Rate and Rhythm: Normal rate and regular rhythm.     Heart sounds:  Normal heart sounds. No murmur heard.    No friction rub. No gallop.  Pulmonary:     Effort: Pulmonary effort is normal. No respiratory distress.     Breath sounds: No stridor. No wheezing, rhonchi or rales.  Chest:     Chest wall: No tenderness.  Musculoskeletal:     Cervical back: Normal range of motion and neck supple.  Lymphadenopathy:     Cervical: No cervical adenopathy.  Skin:    General: Skin is warm and dry.  Neurological:     General: No focal deficit present.     Mental Status: She is alert and oriented to person, place, and time.  Psychiatric:        Mood and Affect: Mood normal.        Behavior: Behavior normal.     Assessment and Plan :   PDMP not reviewed this encounter.  1. Bacterial upper respiratory infection   2. Allergic rhinitis, unspecified seasonality, unspecified trigger    Will manage for bacterial upper respiratory infection likely exacerbating her allergic rhinitis.  Start cefdinir, prednisone .  Patient has taken both to the best of her knowledge without any issues.  Use supportive care otherwise.  Counseled patient on potential for adverse effects with medications prescribed/recommended today, ER and return-to-clinic precautions discussed, patient verbalized understanding.    Christopher Savannah, [place] 04/09/24 1955

## 2024-06-07 ENCOUNTER — Other Ambulatory Visit: Payer: Self-pay | Admitting: Internal Medicine

## 2024-06-15 ENCOUNTER — Encounter: Admitting: Internal Medicine

## 2024-06-20 ENCOUNTER — Encounter: Admitting: Internal Medicine
